# Patient Record
Sex: Female | Born: 1935 | Race: White | Hispanic: No | Smoking: Never smoker
Health system: Southern US, Community
[De-identification: ages and names within clinical notes are randomized; demographics above are authoritative.]

## PROBLEM LIST (undated history)

## (undated) DIAGNOSIS — E039 Hypothyroidism, unspecified: Secondary | ICD-10-CM

## (undated) DIAGNOSIS — T4145XA Adverse effect of unspecified anesthetic, initial encounter: Secondary | ICD-10-CM

## (undated) DIAGNOSIS — M1990 Unspecified osteoarthritis, unspecified site: Secondary | ICD-10-CM

## (undated) DIAGNOSIS — I10 Essential (primary) hypertension: Secondary | ICD-10-CM

## (undated) DIAGNOSIS — S7292XA Unspecified fracture of left femur, initial encounter for closed fracture: Secondary | ICD-10-CM

## (undated) DIAGNOSIS — E785 Hyperlipidemia, unspecified: Secondary | ICD-10-CM

## (undated) DIAGNOSIS — S5292XA Unspecified fracture of left forearm, initial encounter for closed fracture: Secondary | ICD-10-CM

## (undated) HISTORY — DX: Unspecified fracture of left femur, initial encounter for closed fracture: S72.92XA

## (undated) HISTORY — PX: COLONOSCOPY: SHX174

## (undated) HISTORY — DX: Hypothyroidism, unspecified: E03.9

## (undated) HISTORY — PX: ABDOMINAL HYSTERECTOMY: SHX81

## (undated) HISTORY — DX: Hyperlipidemia, unspecified: E78.5

## (undated) HISTORY — DX: Unspecified fracture of left forearm, initial encounter for closed fracture: S52.92XA

## (undated) HISTORY — DX: Essential (primary) hypertension: I10

---

## 1999-07-02 LAB — COMPREHENSIVE METABOLIC PANEL: Albumin: 4 (ref 3.5–5.0)

## 1999-07-02 LAB — LIPID PANEL: Cholesterol: 199 (ref 0–200)

## 1999-07-02 LAB — TSH: TSH: 4.49 (ref 0.41–5.90)

## 2001-01-16 ENCOUNTER — Encounter: Admission: RE | Admit: 2001-01-16 | Discharge: 2001-04-16 | Payer: Self-pay

## 2007-11-10 ENCOUNTER — Ambulatory Visit: Payer: Self-pay | Admitting: Vascular Surgery

## 2007-11-10 ENCOUNTER — Encounter (INDEPENDENT_AMBULATORY_CARE_PROVIDER_SITE_OTHER): Payer: Self-pay | Admitting: Emergency Medicine

## 2007-11-10 ENCOUNTER — Emergency Department (HOSPITAL_COMMUNITY): Admission: EM | Admit: 2007-11-10 | Discharge: 2007-11-10 | Payer: Self-pay | Admitting: Emergency Medicine

## 2007-11-25 ENCOUNTER — Ambulatory Visit: Payer: Self-pay | Admitting: Internal Medicine

## 2007-11-25 DIAGNOSIS — J984 Other disorders of lung: Secondary | ICD-10-CM

## 2008-05-21 ENCOUNTER — Encounter: Admission: RE | Admit: 2008-05-21 | Discharge: 2008-05-21 | Payer: Self-pay | Admitting: *Deleted

## 2008-05-27 ENCOUNTER — Ambulatory Visit: Payer: Self-pay | Admitting: Internal Medicine

## 2010-06-18 DIAGNOSIS — S7292XA Unspecified fracture of left femur, initial encounter for closed fracture: Secondary | ICD-10-CM

## 2010-06-18 DIAGNOSIS — S5292XA Unspecified fracture of left forearm, initial encounter for closed fracture: Secondary | ICD-10-CM

## 2010-06-18 HISTORY — DX: Unspecified fracture of left femur, initial encounter for closed fracture: S72.92XA

## 2010-06-18 HISTORY — DX: Unspecified fracture of left forearm, initial encounter for closed fracture: S52.92XA

## 2011-03-14 LAB — POCT I-STAT, CHEM 8
Calcium, Ion: 1.15
Chloride: 106
Creatinine, Ser: 1.2
Glucose, Bld: 103 — ABNORMAL HIGH
HCT: 40

## 2011-03-14 LAB — CBC
HCT: 40
MCV: 91
Platelets: 236
WBC: 4.8

## 2011-04-07 ENCOUNTER — Emergency Department (HOSPITAL_COMMUNITY): Payer: Medicare Other

## 2011-04-07 ENCOUNTER — Inpatient Hospital Stay (HOSPITAL_COMMUNITY)
Admission: EM | Admit: 2011-04-07 | Discharge: 2011-04-11 | DRG: 481 | Disposition: A | Discharge status: Skilled Nursing Facility | Payer: Medicare Other | Attending: Orthopedic Surgery | Admitting: Orthopedic Surgery

## 2011-04-07 DIAGNOSIS — W010XXA Fall on same level from slipping, tripping and stumbling without subsequent striking against object, initial encounter: Secondary | ICD-10-CM | POA: Diagnosis present

## 2011-04-07 DIAGNOSIS — E039 Hypothyroidism, unspecified: Secondary | ICD-10-CM | POA: Diagnosis present

## 2011-04-07 DIAGNOSIS — S72143A Displaced intertrochanteric fracture of unspecified femur, initial encounter for closed fracture: Principal | ICD-10-CM | POA: Diagnosis present

## 2011-04-07 DIAGNOSIS — S52509A Unspecified fracture of the lower end of unspecified radius, initial encounter for closed fracture: Secondary | ICD-10-CM | POA: Diagnosis present

## 2011-04-07 DIAGNOSIS — I10 Essential (primary) hypertension: Secondary | ICD-10-CM | POA: Diagnosis present

## 2011-04-07 LAB — PROTIME-INR: Prothrombin Time: 12.7 seconds (ref 11.6–15.2)

## 2011-04-07 LAB — CBC
MCH: 31.6 pg (ref 26.0–34.0)
MCHC: 34.2 g/dL (ref 30.0–36.0)
MCV: 92.2 fL (ref 78.0–100.0)
Platelets: 206 10*3/uL (ref 150–400)
RDW: 13.2 % (ref 11.5–15.5)

## 2011-04-07 LAB — DIFFERENTIAL
Basophils Relative: 0 % (ref 0–1)
Eosinophils Absolute: 0 10*3/uL (ref 0.0–0.7)
Eosinophils Relative: 0 % (ref 0–5)
Monocytes Absolute: 0.5 10*3/uL (ref 0.1–1.0)
Monocytes Relative: 4 % (ref 3–12)
Neutrophils Relative %: 88 % — ABNORMAL HIGH (ref 43–77)

## 2011-04-07 LAB — POCT I-STAT, CHEM 8
BUN: 21 mg/dL (ref 6–23)
Calcium, Ion: 1.13 mmol/L (ref 1.12–1.32)
Creatinine, Ser: 1.1 mg/dL (ref 0.50–1.10)
Glucose, Bld: 116 mg/dL — ABNORMAL HIGH (ref 70–99)
TCO2: 24 mmol/L (ref 0–100)

## 2011-04-07 LAB — BASIC METABOLIC PANEL
BUN: 21 mg/dL (ref 6–23)
Creatinine, Ser: 0.89 mg/dL (ref 0.50–1.10)
GFR calc Af Amer: 69 mL/min — ABNORMAL LOW (ref >90)
GFR calc non Af Amer: 60 mL/min — ABNORMAL LOW (ref >90)
Glucose, Bld: 131 mg/dL — ABNORMAL HIGH (ref 70–99)

## 2011-04-08 ENCOUNTER — Inpatient Hospital Stay (HOSPITAL_COMMUNITY): Payer: Medicare Other

## 2011-04-08 HISTORY — PX: ORIF FEMUR FRACTURE: SHX2119

## 2011-04-09 LAB — BASIC METABOLIC PANEL
BUN: 12 mg/dL (ref 6–23)
Chloride: 100 mEq/L (ref 96–112)
GFR calc Af Amer: 90 mL/min — ABNORMAL LOW (ref >90)
Glucose, Bld: 141 mg/dL — ABNORMAL HIGH (ref 70–99)
Potassium: 3.6 mEq/L (ref 3.5–5.1)
Sodium: 134 mEq/L — ABNORMAL LOW (ref 135–145)

## 2011-04-09 LAB — PROTIME-INR: INR: 1.16 (ref 0.00–1.49)

## 2011-04-09 LAB — CBC
HCT: 29.2 % — ABNORMAL LOW (ref 36.0–46.0)
Hemoglobin: 10 g/dL — ABNORMAL LOW (ref 12.0–15.0)
MCH: 31.5 pg (ref 26.0–34.0)
MCHC: 34.2 g/dL (ref 30.0–36.0)
MCV: 92.1 fL (ref 78.0–100.0)
RDW: 13.6 % (ref 11.5–15.5)

## 2011-04-09 NOTE — H&P (Signed)
NAMESAMAN, GIDDENS NO.:  [id number]  MEDICAL RECORD NO.:  [id number]  LOCATION:  2585                         FACILITY:  [facility]  PHYSICIAN:  Tania Ade, MD    DATE OF BIRTH:  1928-08-13  DATE OF ADMISSION:  04/07/2011 DATE OF DISCHARGE:                             HISTORY & PHYSICAL   CHIEF COMPLAINT:  Left wrist and left hip pain.  HISTORY OF PRESENT ILLNESS:  Ms. Zupko is an very pleasant 77 year old, active and healthy female who was out walking her dogs earlier this afternoon and got tangled up.  She had a fall onto her left side and had complaint of left wrist and hip pain.  She was taken to the [facility] Emergency Department, where x-rays revealed a comminuted left distal radius fracture and left hip fracture.  I was consulted for evaluation and management.  Her pain is moderate to severe in the left wrist and moderate in the left hip worse with any movement better with rest.  She denies any loss of consciousness with the injury.  Denies any other injuries.  She did not hit her head in the fall.  PAST MEDICAL HISTORY:  Significant for hypothyroid and mild hypertension.  MEDICATIONS:  Synthroid and multivitamins.  ALLERGIES:  To PENICILLIN.  SOCIAL HISTORY:  Nonsmoker.  Occasional drinker.  She lives with her husband, who recently had a stroke in his recovery.  She previously walked without any assistive device.  REVIEW OF SYSTEMS:  As above otherwise negative.  PHYSICAL EXAMINATION:  VITAL SIGNS:  Temperature 100.0, blood pressure 172/86, pulse 76, respiratory rate 20 and O2 sats 96% on room air. GENERAL:  She is awake, alert and oriented x3.  Extraocular motions intact.  No respiratory distress.  EXTREMITIES:  Left upper extremity demonstrates a well-fitting volar splint.  I rewrapped it loosely because she was complaining it was a bit too tight.  Skin is intact. She can wiggle her fingers and thumb.  She has a normal sensation  to light touch median, ulnar and radial nerve distributions.  Capillary refill less than 2 seconds.  Right upper extremity is atraumatic.  The left lower extremity is short and externally rotated.  She can wiggle her toes up and down on both sides and has a normal sensation to light touch on the dorsal and plantar aspect of the foot with 2+ pedal pulses.  DIAGNOSTIC STUDIES:  X-rays including 4-views of the left wrist demonstrate a comminuted distal radius and ulnar styloid fracture with radial deviation and shortening.  Fracture appears intra-articular.  The 3-views of the left hip demonstrate a three-part intertrochanteric proximal femur fracture with displacement of the lesser trochanter.  LABORATORY VALUES:  WBC 13, hemoglobin 12.5, hematocrit 36.5, platelets 206. INR 0.93. Potassium 3.5, glucose 116.  IMPRESSION:  An 77 year old active female with left comminuted distal radius fracture and left intertrochanteric femur fracture.  PLAN:  She will be admitted to the Orthopedic Service.  She is elderly, but very active and healthy and has minimal medical issues that are currently active.  I spoke to her at length about her diagnosis and recommended surgical fixation of the hip to promote early ambulation and prevent complications  of bedrest.  She elects to go forward with surgery.  Plan will be for an intramedullary and cephalomedullary nail.  We will plan on getting her surgery done in the morning.  At the time of surgery, I have also performed closed reduction of the distal radius fracture and I assume to get acceptable alignment.  I will put her in a good sugar tong splint.  I told her that I may open reduce and fixate with a plate if necessary, but I think it is very unlikely.  She will have SCDs and TED hose for DVT prophylaxis preoperatively and then will have Coumadin postoperatively.  All of her questions were answered to her satisfaction.     Tania Ade,  MD     JC/MEDQ  D:  04/07/2011  T:  04/08/2011  Job:  [id number]  Electronically Signed by Tania Ade  on 04/09/2011 08:14:53 AM

## 2011-04-09 NOTE — Op Note (Signed)
NAMEJANNAT, Amber [name]:  [id number]  MEDICAL RECORD NO.:  [id number]  LOCATION:  0300                         FACILITY:  [facility]  PHYSICIAN:  Tania Ade, MD    DATE OF BIRTH:  [birth year]  DATE OF PROCEDURE:  04/08/2011 DATE OF DISCHARGE:                              OPERATIVE REPORT   PREOPERATIVE DIAGNOSES: 1. Left hip three-part intertrochanteric femur fracture. 2. Left distal radius comminuted intra-articular fracture.  POSTOPERATIVE DIAGNOSES: 1. Left hip three-part intertrochanteric femur fracture. 2. Left distal radius comminuted intra-articular fracture.  PROCEDURE PERFORMED: 1. Left hip trochanteric nail fixation. 2. Left distal radius closed reduction and splinting.  ATTENDING SURGEON:  Tania Ade, MD  ASSISTANT:  None.  ANESTHESIA:  GETA.  COMPLICATIONS:  None.  DRAINS:  None.  SPECIMENS:  None.  ESTIMATED BLOOD LOSS:  50 mL.  INDICATION FOR SURGERY:  The patient is an 77 year old female who is quite healthy.  She had a fall yesterday afternoon and suffered the above injuries.  She was indicated for operative treatment of the hip fracture to promote early ambulation and prevent complications of bedrest.  She understood risks, benefits and alternatives to surgery, and wished to go forward.  OPERATIVE FINDINGS:  A DePuy Affixus nail, which was 360 x 11 mm with a 95-mm lag screw proximally was placed with 1 distal interlocking screw with anatomic alignment.  Close reduction and splinting was carried out for the comminuted left distal radius fracture.  Fracture was brought out to length and acceptable alignment.  PROCEDURE:  The patient was identified in the preoperative holding area where I personally marked the operative site after verifying site, side, and procedure with the patient.  She had general anesthesia induced without complication.  She received preoperative antibiotics prior to the procedure.  A Foley  catheter was placed by the OR nurse.  She was placed on the fracture table with the left lower extremity in traction and the right lower extremity in an abducted-flexed position.  The left arm was draped across her chest and her splint.  Right arm was on the arm board.  Fluoroscopic imaging demonstrated anatomic reduction of the fracture after some traction and internal rotation was applied.  The left hip was then prepped and draped in standard sterile fashion and an approximately 3 cm incision was made proximal to the greater trochanter tip.  Dissection was carried down to the tip.  The guidewire was placed under fluoroscopic guidance and AP and lateral planes felt to be in good position.  The entry-reamer was then used and the nail was placed.  The lateral guide was then used to identify the position for a small approximately 15 mm incision in the lateral thigh and the guide was placed against the lateral femur.  The guidewire was advanced to a center-center point in the femoral head and is verified with fluoroscopic imaging.  The appropriate size of the screw was measured and the over-reamer was used.  The lag screw was then advanced and its position was verified with x-ray.  The proximal jig was then removed and attention was turned distally where using perfect circles technique, 1 static interlocking screw was placed from lateral to medial  through a percutaneous incision.  Final fluoroscopic imaging in AP and lateral planes proximally and distally demonstrated anatomic reduction of fracture with appropriate length and alignment. With all wounds were copiously irrigated and subsequently closed in layers with a number 1 Vicryl in deep fascial layer, 2-0 Vicryl in deep dermal layer, and staples for skin closure.  Sterile dressings were then applied including 4x4s and Mepilex dressings.  The patient was then taken out of traction and attention was turned to the left  upper extremity.  The splint was taken off of the left upper extremity.  She has a small abrasion on the ulnar aspect of the wrist, but clearly did not communicate deeply.  Using the traction and ulnar deviation, I was able to bring the wrist out into acceptable length and alignment.  I then placed her in a well-padded, well-molded sugar-tong splint.  X-rays in the splint demonstrated acceptable reduction.  Once the splint hardened, she was allowed to awaken from general anesthesia, transferred to stretcher and taken to the recovery room in stable condition.  POSTOPERATIVE PLAN:  She will be weightbearing as tolerated on the left lower extremity.  She can be weightbearing as tolerated through the left elbow on a platform-walker.  She will be observed in the hospital postoperatively when she is medically stable.  She will be likely transition to a skilled nursing facility.  She will have Coumadin for DVT prophylaxis.     Tania Ade, MD     JC/MEDQ  D:  04/08/2011  T:  04/08/2011  Job:  [id number]  Electronically Signed by Tania Ade  on 04/09/2011 08:14:57 AM

## 2011-04-10 LAB — BASIC METABOLIC PANEL
BUN: 11 mg/dL (ref 6–23)
BUN: 12 mg/dL (ref 6–23)
CO2: 25 mEq/L (ref 19–32)
Calcium: 8.7 mg/dL (ref 8.4–10.5)
Chloride: 102 mEq/L (ref 96–112)
Chloride: 102 mEq/L (ref 96–112)
Creatinine, Ser: 0.72 mg/dL (ref 0.50–1.10)
Creatinine, Ser: 0.83 mg/dL (ref 0.50–1.10)
GFR calc non Af Amer: 79 mL/min — ABNORMAL LOW (ref >90)
Glucose, Bld: 381 mg/dL — ABNORMAL HIGH (ref 70–99)
Potassium: 5.2 mEq/L — ABNORMAL HIGH (ref 3.5–5.1)

## 2011-04-10 LAB — CBC
MCH: 30.7 pg (ref 26.0–34.0)
MCHC: 33.2 g/dL (ref 30.0–36.0)
MCV: 92.6 fL (ref 78.0–100.0)
Platelets: 130 10*3/uL — ABNORMAL LOW (ref 150–400)
RBC: 2.96 MIL/uL — ABNORMAL LOW (ref 3.87–5.11)

## 2011-04-10 NOTE — Consult Note (Signed)
NAMEMARYJANE, Amber [name]:  [id number]  MEDICAL RECORD NO.:  [id number]  LOCATION:  0932                         FACILITY:  [facility]  PHYSICIAN:  Estill Cotta, MD       DATE OF BIRTH:  [birth year]  DATE OF CONSULTATION:  04/10/2011 DATE OF DISCHARGE:                                CONSULTATION   PRIMARY CARE PHYSICIAN:  Tania Ade, MD  REASON FOR CONSULTATION:  Hyperglycemia with a blood sugar of 380 for one day.  HISTORY OF PRESENT ILLNESS:  Amber Bryant is an 77 year old pleasant female with history of hypertension and hypothyroidism who is currently admitted under Orthopedic Service.  The patient had a comminuted left distal radius fracture and left hip fracture status post mechanical fall.  The patient had left hip surgery and left distal radius closed reduction and splinting on April 08, 2011.  The patient has been doing well and is currently undergoing physical therapy.  This morning, the patient was noted to have blood sugar of 381.  Upon my encounter with the patient, she denies any history of diabetes mellitus, denies any increased urinary frequency or polyuria or polydipsia.  The patient does not have any nausea, vomiting, any diarrhea constipation, hematochezia, or melena.  PAST MEDICAL HISTORY: 1. History of mild hypertension. 2. Hypothyroidism.  MEDICATIONS PRIOR TO ADMISSION:  Synthroid and multivitamins.  ALLERGIES:  PENICILLIN.  SOCIAL HISTORY:  The patient denies any smoking, drinks alcohol occasionally.  She currently lives with her husband.  REVIEW OF SYSTEMS:  Pertinent positives are dictated above, otherwise negative.  PHYSICAL EXAMINATION:  VITAL SIGNS:  Temperature 98.1, pulse 80, respirations 16, blood pressure 107/67, O2 sats 95% on room air. GENERAL:  The patient is alert and awake, not in any acute distress. HEENT:  Anicteric sclerae, pink conjunctivae, pupils reactive to light and accommodation, EOMI. NECK:  Supple.   No lymphadenopathy. CVS:  S1, S2 clear. CHEST:  Clear to auscultation bilaterally. ABDOMEN:  Soft, nontender, nondistended.  Normal bowel sounds. EXTREMITIES:  No cyanosis, clubbing, or edema noted in upper or lower extremities bilaterally.  LABORATORY/DIAGNOSTIC DATA:  Today INR 1.25.  CBC showed white count of 7.1, hemoglobin 9.1, hematocrit 27.4, platelet 130.  BMET:  Sodium 131, potassium 5.2, BUN 11, creatinine 0.72, calcium 7.7.  IMPRESSION AND PLAN:  Amber Bryant is an 77 year old female with no prior history of diabetes mellitus and past medical history of hypothyroidism and mild hypertension and is currently admitted under Orthopedic Service after left hip surgery and left distal radius surgery. 1. Hyperglycemia.  The patient denies any history of diabetes mellitus     or any diet-controlled diabetes.  I am questioning if her     hyperglycemia is secondary to the D5 half-normal saline that the     patient was receiving which was discontinued today just prior to my encounter.  I will obtain     HB A1c to confirm if the patient has diabetes or not.  For today,     we will check CBGs every 4 hours.  I will hold off on starting any     insulin at this time.  The patient is not on any steroids.  We  will     change the diet to carb modified for now.  If blood sugars are     consistently above 140 x3 and HB A1c is above 6.5, will place     her on low-dose oral hypoglycemic and sliding scale insulin     inpatient.  I will also obtain a BMET to confirm the hyperglycemia     and mild hyperkalemia on the labs. 2. Hypertension, currently stable.  Continue patient's     antihypertensives. 3. History of hypothyroidism, continue Synthroid. 4. Deep venous thrombosis prophylaxis.  The patient is currently on     warfarin for deep venous thrombosis prophylaxis.  Thank you the consult.  I will continue to follow with you.     Estill Cotta, MD     RR/MEDQ  D:  04/10/2011  T:   04/10/2011  Job:  [id number]  Electronically Signed by Symon Norwood  on 04/10/2011 01:47:53 PM

## 2011-04-11 LAB — PROTIME-INR
INR: 1.18 (ref 0.00–1.49)
Prothrombin Time: 15.2 seconds (ref 11.6–15.2)

## 2011-04-11 LAB — CBC
Platelets: 159 10*3/uL (ref 150–400)
RBC: 3.31 MIL/uL — ABNORMAL LOW (ref 3.87–5.11)
WBC: 8.5 10*3/uL (ref 4.0–10.5)

## 2011-04-11 LAB — COMPREHENSIVE METABOLIC PANEL
ALT: 86 U/L — ABNORMAL HIGH (ref 0–35)
AST: 86 U/L — ABNORMAL HIGH (ref 0–37)
Albumin: 2.4 g/dL — ABNORMAL LOW (ref 3.5–5.2)
Alkaline Phosphatase: 173 U/L — ABNORMAL HIGH (ref 39–117)
Potassium: 3.7 mEq/L (ref 3.5–5.1)
Sodium: 135 mEq/L (ref 135–145)
Total Protein: 6 g/dL (ref 6.0–8.3)

## 2011-04-11 LAB — HEMOGLOBIN A1C
Hgb A1c MFr Bld: 5.7 % — ABNORMAL HIGH (ref <5.7)
Mean Plasma Glucose: 117 mg/dL — ABNORMAL HIGH (ref <117)

## 2011-04-21 NOTE — Discharge Summary (Signed)
  NAMEQUINNLYN, [name]:  [id number]  MEDICAL RECORD NO.:  [id number]  LOCATION:  7846                         FACILITY:  [facility]  PHYSICIAN:  Tania Ade, MD    DATE OF BIRTH:  04-13-1929  DATE OF ADMISSION:  04/07/2011 DATE OF DISCHARGE:  04/11/2011                              DISCHARGE SUMMARY   FINAL DIAGNOSES: 1. Hypothyroidism. 2. Hypertension. 3. Left intertrochanteric hip fracture. 4. Left comminuted distal radius fracture.  PRINCIPAL PROCEDURE: 1. Left hip trochanteric nail fixation. 2. Left distal radius fracture closed reduction and splinting on     April 08, 2011.  HOSPITAL COURSE:  Ms. Shankles is an 77 year old female who had a mechanical fall on April 07, 2011.  She was admitted through the [facility] Emergency Department to my service for management of her left hip fracture and wrist fracture.  She was taken to the operating room on April 08, 2011 where she had successful trochanteric nail fixation of the left hip and closed reduction and splinting of left distal radius fracture.  She recovered well on the floor postoperatively.  Her pain was initially controlled with PCA and subsequently transitioned to oral medications.  She did well with physical therapy and was felt to be a good candidate for skilled nursing facility, given her social situation at home and her slow progression.  Her Foley catheter was discontinued on postoperative day #2, and she was started on Coumadin immediately postoperatively for DVT prophylaxis as well as SCDs and TED hose throughout her hospital stay.  She did have a slight rise in her potassium and blood glucose on postoperative day #2, and Internal Medicine Service was consulted.  Her lab values quickly normalized, and no further intervention was needed.  No blood transfusion was needed. On postoperative day #3, she was felt to be stable for transfer to skilled nursing facility.  Her vital signs  were stable.  Her dressings were changed and her incisions were clean, dry, and intact.  DISCHARGE INSTRUCTIONS:  She will remain weightbearing as tolerated on the left lower extremity and weightbearing as tolerated through the left elbow on a platform walker.  She will have Percocet for pain control and will continue on Coumadin per protocol with a goal INR of 1.5-2.5 for 30 days postoperatively.  She will follow up with me in my office 10-14 days postoperatively for wound check and transition to a short-arm cast on the left arm.     Tania Ade, MD    JC/MEDQ  D:  04/11/2011  T:  04/11/2011  Job:  [id number]  Electronically Signed by Tania Ade  on 04/21/2011 01:48:13 PM

## 2011-06-21 DIAGNOSIS — M25539 Pain in unspecified wrist: Secondary | ICD-10-CM | POA: Diagnosis not present

## 2011-06-21 DIAGNOSIS — S52599A Other fractures of lower end of unspecified radius, initial encounter for closed fracture: Secondary | ICD-10-CM | POA: Diagnosis not present

## 2011-06-26 DIAGNOSIS — M25539 Pain in unspecified wrist: Secondary | ICD-10-CM | POA: Diagnosis not present

## 2011-06-28 DIAGNOSIS — M25539 Pain in unspecified wrist: Secondary | ICD-10-CM | POA: Diagnosis not present

## 2011-06-29 DIAGNOSIS — M6530 Trigger finger, unspecified finger: Secondary | ICD-10-CM | POA: Diagnosis not present

## 2011-06-29 DIAGNOSIS — S7223XA Displaced subtrochanteric fracture of unspecified femur, initial encounter for closed fracture: Secondary | ICD-10-CM | POA: Diagnosis not present

## 2011-06-29 DIAGNOSIS — M25539 Pain in unspecified wrist: Secondary | ICD-10-CM | POA: Diagnosis not present

## 2011-06-29 DIAGNOSIS — S52599A Other fractures of lower end of unspecified radius, initial encounter for closed fracture: Secondary | ICD-10-CM | POA: Diagnosis not present

## 2011-07-05 DIAGNOSIS — M25539 Pain in unspecified wrist: Secondary | ICD-10-CM | POA: Diagnosis not present

## 2011-07-12 DIAGNOSIS — M25539 Pain in unspecified wrist: Secondary | ICD-10-CM | POA: Diagnosis not present

## 2011-07-12 DIAGNOSIS — S7223XA Displaced subtrochanteric fracture of unspecified femur, initial encounter for closed fracture: Secondary | ICD-10-CM | POA: Diagnosis not present

## 2011-07-16 DIAGNOSIS — M25539 Pain in unspecified wrist: Secondary | ICD-10-CM | POA: Diagnosis not present

## 2011-07-19 DIAGNOSIS — S7223XA Displaced subtrochanteric fracture of unspecified femur, initial encounter for closed fracture: Secondary | ICD-10-CM | POA: Diagnosis not present

## 2011-07-19 DIAGNOSIS — M25539 Pain in unspecified wrist: Secondary | ICD-10-CM | POA: Diagnosis not present

## 2011-07-26 DIAGNOSIS — M25539 Pain in unspecified wrist: Secondary | ICD-10-CM | POA: Diagnosis not present

## 2011-07-26 DIAGNOSIS — S7223XA Displaced subtrochanteric fracture of unspecified femur, initial encounter for closed fracture: Secondary | ICD-10-CM | POA: Diagnosis not present

## 2011-07-30 DIAGNOSIS — M25539 Pain in unspecified wrist: Secondary | ICD-10-CM | POA: Diagnosis not present

## 2011-08-01 DIAGNOSIS — S7223XA Displaced subtrochanteric fracture of unspecified femur, initial encounter for closed fracture: Secondary | ICD-10-CM | POA: Diagnosis not present

## 2011-08-01 DIAGNOSIS — M25539 Pain in unspecified wrist: Secondary | ICD-10-CM | POA: Diagnosis not present

## 2011-08-06 DIAGNOSIS — S7223XA Displaced subtrochanteric fracture of unspecified femur, initial encounter for closed fracture: Secondary | ICD-10-CM | POA: Diagnosis not present

## 2011-08-06 DIAGNOSIS — M25539 Pain in unspecified wrist: Secondary | ICD-10-CM | POA: Diagnosis not present

## 2011-08-09 DIAGNOSIS — R42 Dizziness and giddiness: Secondary | ICD-10-CM | POA: Diagnosis not present

## 2011-08-22 DIAGNOSIS — M25539 Pain in unspecified wrist: Secondary | ICD-10-CM | POA: Diagnosis not present

## 2011-08-28 DIAGNOSIS — S7223XA Displaced subtrochanteric fracture of unspecified femur, initial encounter for closed fracture: Secondary | ICD-10-CM | POA: Diagnosis not present

## 2011-08-28 DIAGNOSIS — M25539 Pain in unspecified wrist: Secondary | ICD-10-CM | POA: Diagnosis not present

## 2011-09-06 DIAGNOSIS — S7223XA Displaced subtrochanteric fracture of unspecified femur, initial encounter for closed fracture: Secondary | ICD-10-CM | POA: Diagnosis not present

## 2011-09-06 DIAGNOSIS — M25539 Pain in unspecified wrist: Secondary | ICD-10-CM | POA: Diagnosis not present

## 2011-09-11 DIAGNOSIS — M25539 Pain in unspecified wrist: Secondary | ICD-10-CM | POA: Diagnosis not present

## 2011-09-19 DIAGNOSIS — M949 Disorder of cartilage, unspecified: Secondary | ICD-10-CM | POA: Diagnosis not present

## 2011-09-19 DIAGNOSIS — S9030XA Contusion of unspecified foot, initial encounter: Secondary | ICD-10-CM | POA: Diagnosis not present

## 2011-09-19 DIAGNOSIS — M25579 Pain in unspecified ankle and joints of unspecified foot: Secondary | ICD-10-CM | POA: Diagnosis not present

## 2011-10-02 DIAGNOSIS — E785 Hyperlipidemia, unspecified: Secondary | ICD-10-CM | POA: Diagnosis not present

## 2011-10-02 DIAGNOSIS — M810 Age-related osteoporosis without current pathological fracture: Secondary | ICD-10-CM | POA: Diagnosis not present

## 2011-10-02 DIAGNOSIS — I10 Essential (primary) hypertension: Secondary | ICD-10-CM | POA: Diagnosis not present

## 2011-10-02 DIAGNOSIS — E039 Hypothyroidism, unspecified: Secondary | ICD-10-CM | POA: Diagnosis not present

## 2011-10-03 DIAGNOSIS — E559 Vitamin D deficiency, unspecified: Secondary | ICD-10-CM | POA: Diagnosis not present

## 2011-10-03 DIAGNOSIS — E039 Hypothyroidism, unspecified: Secondary | ICD-10-CM | POA: Diagnosis not present

## 2011-10-03 DIAGNOSIS — I10 Essential (primary) hypertension: Secondary | ICD-10-CM | POA: Diagnosis not present

## 2011-11-14 DIAGNOSIS — M25579 Pain in unspecified ankle and joints of unspecified foot: Secondary | ICD-10-CM | POA: Diagnosis not present

## 2012-02-26 DIAGNOSIS — H2510 Age-related nuclear cataract, unspecified eye: Secondary | ICD-10-CM | POA: Diagnosis not present

## 2012-03-19 DIAGNOSIS — Z23 Encounter for immunization: Secondary | ICD-10-CM | POA: Diagnosis not present

## 2012-04-02 DIAGNOSIS — Z23 Encounter for immunization: Secondary | ICD-10-CM | POA: Diagnosis not present

## 2012-04-02 DIAGNOSIS — Z0000 Encounter for general adult medical examination without abnormal findings: Secondary | ICD-10-CM | POA: Diagnosis not present

## 2012-04-02 DIAGNOSIS — I10 Essential (primary) hypertension: Secondary | ICD-10-CM | POA: Diagnosis not present

## 2012-04-02 DIAGNOSIS — M810 Age-related osteoporosis without current pathological fracture: Secondary | ICD-10-CM | POA: Diagnosis not present

## 2012-04-02 DIAGNOSIS — E039 Hypothyroidism, unspecified: Secondary | ICD-10-CM | POA: Diagnosis not present

## 2012-04-02 DIAGNOSIS — E785 Hyperlipidemia, unspecified: Secondary | ICD-10-CM | POA: Diagnosis not present

## 2012-04-09 DIAGNOSIS — E039 Hypothyroidism, unspecified: Secondary | ICD-10-CM | POA: Diagnosis not present

## 2012-04-09 DIAGNOSIS — E785 Hyperlipidemia, unspecified: Secondary | ICD-10-CM | POA: Diagnosis not present

## 2012-04-09 DIAGNOSIS — M810 Age-related osteoporosis without current pathological fracture: Secondary | ICD-10-CM | POA: Diagnosis not present

## 2012-04-09 DIAGNOSIS — I10 Essential (primary) hypertension: Secondary | ICD-10-CM | POA: Diagnosis not present

## 2012-06-18 HISTORY — PX: CATARACT EXTRACTION W/ INTRAOCULAR LENS  IMPLANT, BILATERAL: SHX1307

## 2012-07-23 DIAGNOSIS — M654 Radial styloid tenosynovitis [de Quervain]: Secondary | ICD-10-CM | POA: Diagnosis not present

## 2012-07-23 DIAGNOSIS — M6530 Trigger finger, unspecified finger: Secondary | ICD-10-CM | POA: Diagnosis not present

## 2012-08-04 DIAGNOSIS — M6530 Trigger finger, unspecified finger: Secondary | ICD-10-CM | POA: Diagnosis not present

## 2012-08-04 DIAGNOSIS — M654 Radial styloid tenosynovitis [de Quervain]: Secondary | ICD-10-CM | POA: Diagnosis not present

## 2012-08-12 DIAGNOSIS — M6530 Trigger finger, unspecified finger: Secondary | ICD-10-CM | POA: Diagnosis not present

## 2012-08-12 DIAGNOSIS — M654 Radial styloid tenosynovitis [de Quervain]: Secondary | ICD-10-CM | POA: Diagnosis not present

## 2012-08-29 DIAGNOSIS — M654 Radial styloid tenosynovitis [de Quervain]: Secondary | ICD-10-CM | POA: Diagnosis not present

## 2012-08-29 DIAGNOSIS — M6530 Trigger finger, unspecified finger: Secondary | ICD-10-CM | POA: Diagnosis not present

## 2012-10-15 DIAGNOSIS — E559 Vitamin D deficiency, unspecified: Secondary | ICD-10-CM | POA: Diagnosis not present

## 2012-10-15 DIAGNOSIS — I10 Essential (primary) hypertension: Secondary | ICD-10-CM | POA: Diagnosis not present

## 2012-10-15 DIAGNOSIS — E785 Hyperlipidemia, unspecified: Secondary | ICD-10-CM | POA: Diagnosis not present

## 2012-10-15 DIAGNOSIS — E039 Hypothyroidism, unspecified: Secondary | ICD-10-CM | POA: Diagnosis not present

## 2013-03-10 DIAGNOSIS — H2510 Age-related nuclear cataract, unspecified eye: Secondary | ICD-10-CM | POA: Diagnosis not present

## 2013-03-16 DIAGNOSIS — Z23 Encounter for immunization: Secondary | ICD-10-CM | POA: Diagnosis not present

## 2013-04-15 DIAGNOSIS — M810 Age-related osteoporosis without current pathological fracture: Secondary | ICD-10-CM | POA: Diagnosis not present

## 2013-04-15 DIAGNOSIS — I10 Essential (primary) hypertension: Secondary | ICD-10-CM | POA: Diagnosis not present

## 2013-04-15 DIAGNOSIS — E039 Hypothyroidism, unspecified: Secondary | ICD-10-CM | POA: Diagnosis not present

## 2013-04-15 DIAGNOSIS — Z1331 Encounter for screening for depression: Secondary | ICD-10-CM | POA: Diagnosis not present

## 2013-04-15 DIAGNOSIS — Z0000 Encounter for general adult medical examination without abnormal findings: Secondary | ICD-10-CM | POA: Diagnosis not present

## 2013-04-15 DIAGNOSIS — E663 Overweight: Secondary | ICD-10-CM | POA: Diagnosis not present

## 2013-04-22 DIAGNOSIS — E785 Hyperlipidemia, unspecified: Secondary | ICD-10-CM | POA: Diagnosis not present

## 2013-04-22 DIAGNOSIS — I10 Essential (primary) hypertension: Secondary | ICD-10-CM | POA: Diagnosis not present

## 2013-04-22 DIAGNOSIS — E039 Hypothyroidism, unspecified: Secondary | ICD-10-CM | POA: Diagnosis not present

## 2013-04-22 DIAGNOSIS — M810 Age-related osteoporosis without current pathological fracture: Secondary | ICD-10-CM | POA: Diagnosis not present

## 2013-06-17 DIAGNOSIS — F329 Major depressive disorder, single episode, unspecified: Secondary | ICD-10-CM | POA: Diagnosis not present

## 2013-08-20 DIAGNOSIS — H2510 Age-related nuclear cataract, unspecified eye: Secondary | ICD-10-CM | POA: Diagnosis not present

## 2013-09-10 DIAGNOSIS — D3130 Benign neoplasm of unspecified choroid: Secondary | ICD-10-CM | POA: Diagnosis not present

## 2013-09-10 DIAGNOSIS — H2510 Age-related nuclear cataract, unspecified eye: Secondary | ICD-10-CM | POA: Diagnosis not present

## 2013-09-10 DIAGNOSIS — H43819 Vitreous degeneration, unspecified eye: Secondary | ICD-10-CM | POA: Diagnosis not present

## 2013-09-21 DIAGNOSIS — H2510 Age-related nuclear cataract, unspecified eye: Secondary | ICD-10-CM | POA: Diagnosis not present

## 2013-09-21 DIAGNOSIS — H269 Unspecified cataract: Secondary | ICD-10-CM | POA: Diagnosis not present

## 2013-09-22 DIAGNOSIS — H2510 Age-related nuclear cataract, unspecified eye: Secondary | ICD-10-CM | POA: Diagnosis not present

## 2013-09-28 DIAGNOSIS — H2510 Age-related nuclear cataract, unspecified eye: Secondary | ICD-10-CM | POA: Diagnosis not present

## 2013-10-02 DIAGNOSIS — I8310 Varicose veins of unspecified lower extremity with inflammation: Secondary | ICD-10-CM | POA: Diagnosis not present

## 2013-10-13 DIAGNOSIS — L97929 Non-pressure chronic ulcer of unspecified part of left lower leg with unspecified severity: Secondary | ICD-10-CM | POA: Diagnosis not present

## 2013-10-13 DIAGNOSIS — I83219 Varicose veins of right lower extremity with both ulcer of unspecified site and inflammation: Secondary | ICD-10-CM | POA: Diagnosis not present

## 2013-10-13 DIAGNOSIS — L97919 Non-pressure chronic ulcer of unspecified part of right lower leg with unspecified severity: Secondary | ICD-10-CM | POA: Diagnosis not present

## 2013-10-16 DIAGNOSIS — I10 Essential (primary) hypertension: Secondary | ICD-10-CM | POA: Diagnosis not present

## 2013-10-16 DIAGNOSIS — E039 Hypothyroidism, unspecified: Secondary | ICD-10-CM | POA: Diagnosis not present

## 2013-10-16 DIAGNOSIS — M810 Age-related osteoporosis without current pathological fracture: Secondary | ICD-10-CM | POA: Diagnosis not present

## 2013-10-16 DIAGNOSIS — I83219 Varicose veins of right lower extremity with both ulcer of unspecified site and inflammation: Secondary | ICD-10-CM | POA: Diagnosis not present

## 2013-10-16 DIAGNOSIS — I83229 Varicose veins of left lower extremity with both ulcer of unspecified site and inflammation: Secondary | ICD-10-CM | POA: Diagnosis not present

## 2013-10-16 DIAGNOSIS — L97929 Non-pressure chronic ulcer of unspecified part of left lower leg with unspecified severity: Secondary | ICD-10-CM | POA: Diagnosis not present

## 2013-10-27 DIAGNOSIS — I10 Essential (primary) hypertension: Secondary | ICD-10-CM | POA: Diagnosis not present

## 2013-10-27 DIAGNOSIS — N183 Chronic kidney disease, stage 3 unspecified: Secondary | ICD-10-CM | POA: Diagnosis not present

## 2013-10-27 DIAGNOSIS — E785 Hyperlipidemia, unspecified: Secondary | ICD-10-CM | POA: Diagnosis not present

## 2013-10-27 DIAGNOSIS — E039 Hypothyroidism, unspecified: Secondary | ICD-10-CM | POA: Diagnosis not present

## 2013-10-28 DIAGNOSIS — M79609 Pain in unspecified limb: Secondary | ICD-10-CM | POA: Diagnosis not present

## 2013-10-28 DIAGNOSIS — I8310 Varicose veins of unspecified lower extremity with inflammation: Secondary | ICD-10-CM | POA: Diagnosis not present

## 2013-10-30 DIAGNOSIS — I8310 Varicose veins of unspecified lower extremity with inflammation: Secondary | ICD-10-CM | POA: Diagnosis not present

## 2013-10-30 DIAGNOSIS — M79609 Pain in unspecified limb: Secondary | ICD-10-CM | POA: Diagnosis not present

## 2013-11-02 DIAGNOSIS — I8310 Varicose veins of unspecified lower extremity with inflammation: Secondary | ICD-10-CM | POA: Diagnosis not present

## 2013-11-02 DIAGNOSIS — M79609 Pain in unspecified limb: Secondary | ICD-10-CM | POA: Diagnosis not present

## 2013-11-04 DIAGNOSIS — M79609 Pain in unspecified limb: Secondary | ICD-10-CM | POA: Diagnosis not present

## 2013-11-04 DIAGNOSIS — I8310 Varicose veins of unspecified lower extremity with inflammation: Secondary | ICD-10-CM | POA: Diagnosis not present

## 2013-11-11 DIAGNOSIS — M79609 Pain in unspecified limb: Secondary | ICD-10-CM | POA: Diagnosis not present

## 2013-11-11 DIAGNOSIS — I8310 Varicose veins of unspecified lower extremity with inflammation: Secondary | ICD-10-CM | POA: Diagnosis not present

## 2013-11-18 DIAGNOSIS — I83219 Varicose veins of right lower extremity with both ulcer of unspecified site and inflammation: Secondary | ICD-10-CM | POA: Diagnosis not present

## 2013-11-18 DIAGNOSIS — L97929 Non-pressure chronic ulcer of unspecified part of left lower leg with unspecified severity: Secondary | ICD-10-CM | POA: Diagnosis not present

## 2013-11-23 ENCOUNTER — Encounter (HOSPITAL_BASED_OUTPATIENT_CLINIC_OR_DEPARTMENT_OTHER): Payer: Medicare Other | Attending: Plastic Surgery

## 2013-11-23 DIAGNOSIS — N189 Chronic kidney disease, unspecified: Secondary | ICD-10-CM | POA: Diagnosis not present

## 2013-11-23 DIAGNOSIS — E039 Hypothyroidism, unspecified: Secondary | ICD-10-CM | POA: Diagnosis not present

## 2013-11-23 DIAGNOSIS — Z79899 Other long term (current) drug therapy: Secondary | ICD-10-CM | POA: Insufficient documentation

## 2013-11-23 DIAGNOSIS — M810 Age-related osteoporosis without current pathological fracture: Secondary | ICD-10-CM | POA: Diagnosis not present

## 2013-11-23 DIAGNOSIS — E785 Hyperlipidemia, unspecified: Secondary | ICD-10-CM | POA: Diagnosis not present

## 2013-11-23 DIAGNOSIS — I8390 Asymptomatic varicose veins of unspecified lower extremity: Secondary | ICD-10-CM | POA: Diagnosis not present

## 2013-11-23 DIAGNOSIS — L97309 Non-pressure chronic ulcer of unspecified ankle with unspecified severity: Secondary | ICD-10-CM | POA: Diagnosis not present

## 2013-11-23 DIAGNOSIS — Z7982 Long term (current) use of aspirin: Secondary | ICD-10-CM | POA: Insufficient documentation

## 2013-11-23 DIAGNOSIS — I129 Hypertensive chronic kidney disease with stage 1 through stage 4 chronic kidney disease, or unspecified chronic kidney disease: Secondary | ICD-10-CM | POA: Insufficient documentation

## 2013-11-24 NOTE — Progress Notes (Signed)
[facility]  NAME:  Amber Bryant, Amber [name]:  [id number]  MEDICAL RECORD NO.:  [id number]      DATE OF BIRTH:  January 27, 1929  PHYSICIAN:  Theodoro Kos, DO       VISIT DATE:  11/23/2013                                  OFFICE VISIT   HISTORY OF PRESENT ILLNESS:  The patient is an 80 year old female, who is here for further evaluation on her left ankle ulcer from trauma.  She was at the beauty salon when she accidentally hit her ankle on a metal bar and she was seen by the vascular folks and wrap was placed, and today she is actually healed.  PAST MEDICAL HISTORY:  Positive for hypertension, hyperlipidemia, hypothyroidism, chronic kidney disease, osteoporosis, depression, and ankle wound.  ALLERGIES:  Amoxicillin.  MEDICATIONS:  Aspirin, Lasix, vitamin D, triamterene, hydrochlorothiazide, Synthroid, citalopram, lovastatin, and potassium.  FAMILY HISTORY:  Mother died at 51 colon cancer.  Father 36 with an MI.  PAST SURGICAL HISTORY:  Cataract and left hip pinning.  REVIEW OF SYSTEMS:  Otherwise negative.  PHYSICAL EXAMINATION:  GENERAL:  On exam, she is alert, oriented, cooperative, not in any acute distress.  She is pleasant. HEENT:  Pupils are equal.  Extraocular muscles are intact. NECK:  She does not have any cervical lymphadenopathy. LUNGS:  Her breathing is unlabored. HEART:  Rate is regular. EXTREMITIES:  The bilateral lower extremity has chronic venous disease with varicose veins and hemosiderosis.  Pulses are present.  There is no wound on the left leg. She has some dry skin and some blisters on her toes.  ASSESSMENT AND PLAN:  We spent time talking about protein intake, wound care, foot care, compression and elevation, multivitamin, vitamin C, zinc, and recommendation is for compression Bag Balm for moisture, elevation, and follow up as needed.     Theodoro Kos, DO     CS/MEDQ  D:  11/23/2013  T:  11/24/2013  Job:   [id number]

## 2013-12-02 DIAGNOSIS — M79609 Pain in unspecified limb: Secondary | ICD-10-CM | POA: Diagnosis not present

## 2013-12-02 DIAGNOSIS — I8310 Varicose veins of unspecified lower extremity with inflammation: Secondary | ICD-10-CM | POA: Diagnosis not present

## 2014-01-06 DIAGNOSIS — M79609 Pain in unspecified limb: Secondary | ICD-10-CM | POA: Diagnosis not present

## 2014-01-06 DIAGNOSIS — I8310 Varicose veins of unspecified lower extremity with inflammation: Secondary | ICD-10-CM | POA: Diagnosis not present

## 2014-01-07 DIAGNOSIS — H1044 Vernal conjunctivitis: Secondary | ICD-10-CM | POA: Diagnosis not present

## 2014-02-03 DIAGNOSIS — M7981 Nontraumatic hematoma of soft tissue: Secondary | ICD-10-CM | POA: Diagnosis not present

## 2014-02-03 DIAGNOSIS — I8310 Varicose veins of unspecified lower extremity with inflammation: Secondary | ICD-10-CM | POA: Diagnosis not present

## 2014-02-03 DIAGNOSIS — M79609 Pain in unspecified limb: Secondary | ICD-10-CM | POA: Diagnosis not present

## 2014-03-11 DIAGNOSIS — Z23 Encounter for immunization: Secondary | ICD-10-CM | POA: Diagnosis not present

## 2014-03-24 DIAGNOSIS — I87322 Chronic venous hypertension (idiopathic) with inflammation of left lower extremity: Secondary | ICD-10-CM | POA: Diagnosis not present

## 2014-03-24 DIAGNOSIS — I8312 Varicose veins of left lower extremity with inflammation: Secondary | ICD-10-CM | POA: Diagnosis not present

## 2014-03-24 DIAGNOSIS — I83202 Varicose veins of unspecified lower extremity with both ulcer of calf and inflammation: Secondary | ICD-10-CM | POA: Diagnosis not present

## 2014-04-21 DIAGNOSIS — I10 Essential (primary) hypertension: Secondary | ICD-10-CM | POA: Diagnosis not present

## 2014-04-21 DIAGNOSIS — M810 Age-related osteoporosis without current pathological fracture: Secondary | ICD-10-CM | POA: Diagnosis not present

## 2014-04-21 DIAGNOSIS — I83892 Varicose veins of left lower extremities with other complications: Secondary | ICD-10-CM | POA: Diagnosis not present

## 2014-04-21 DIAGNOSIS — E039 Hypothyroidism, unspecified: Secondary | ICD-10-CM | POA: Diagnosis not present

## 2014-04-21 DIAGNOSIS — I83203 Varicose veins of unspecified lower extremity with both ulcer of ankle and inflammation: Secondary | ICD-10-CM | POA: Diagnosis not present

## 2014-04-22 DIAGNOSIS — Z0000 Encounter for general adult medical examination without abnormal findings: Secondary | ICD-10-CM | POA: Diagnosis not present

## 2014-04-22 DIAGNOSIS — Z1389 Encounter for screening for other disorder: Secondary | ICD-10-CM | POA: Diagnosis not present

## 2014-04-22 DIAGNOSIS — Z23 Encounter for immunization: Secondary | ICD-10-CM | POA: Diagnosis not present

## 2014-04-29 DIAGNOSIS — I10 Essential (primary) hypertension: Secondary | ICD-10-CM | POA: Diagnosis not present

## 2014-04-29 DIAGNOSIS — E039 Hypothyroidism, unspecified: Secondary | ICD-10-CM | POA: Diagnosis not present

## 2014-04-29 DIAGNOSIS — N183 Chronic kidney disease, stage 3 (moderate): Secondary | ICD-10-CM | POA: Diagnosis not present

## 2014-04-29 DIAGNOSIS — E785 Hyperlipidemia, unspecified: Secondary | ICD-10-CM | POA: Diagnosis not present

## 2014-05-05 DIAGNOSIS — R2241 Localized swelling, mass and lump, right lower limb: Secondary | ICD-10-CM | POA: Diagnosis not present

## 2014-05-26 DIAGNOSIS — M79651 Pain in right thigh: Secondary | ICD-10-CM | POA: Diagnosis not present

## 2014-05-26 DIAGNOSIS — I8311 Varicose veins of right lower extremity with inflammation: Secondary | ICD-10-CM | POA: Diagnosis not present

## 2014-05-26 DIAGNOSIS — M79604 Pain in right leg: Secondary | ICD-10-CM | POA: Diagnosis not present

## 2014-06-25 DIAGNOSIS — I8311 Varicose veins of right lower extremity with inflammation: Secondary | ICD-10-CM | POA: Diagnosis not present

## 2014-06-28 DIAGNOSIS — I8311 Varicose veins of right lower extremity with inflammation: Secondary | ICD-10-CM | POA: Diagnosis not present

## 2014-07-07 DIAGNOSIS — I8311 Varicose veins of right lower extremity with inflammation: Secondary | ICD-10-CM | POA: Diagnosis not present

## 2014-07-23 DIAGNOSIS — I8311 Varicose veins of right lower extremity with inflammation: Secondary | ICD-10-CM | POA: Diagnosis not present

## 2014-08-09 DIAGNOSIS — H26493 Other secondary cataract, bilateral: Secondary | ICD-10-CM | POA: Diagnosis not present

## 2014-08-16 DIAGNOSIS — J018 Other acute sinusitis: Secondary | ICD-10-CM | POA: Diagnosis not present

## 2014-09-08 DIAGNOSIS — I8311 Varicose veins of right lower extremity with inflammation: Secondary | ICD-10-CM | POA: Diagnosis not present

## 2014-09-28 DIAGNOSIS — L570 Actinic keratosis: Secondary | ICD-10-CM | POA: Diagnosis not present

## 2014-09-28 DIAGNOSIS — L308 Other specified dermatitis: Secondary | ICD-10-CM | POA: Diagnosis not present

## 2014-09-28 DIAGNOSIS — L821 Other seborrheic keratosis: Secondary | ICD-10-CM | POA: Diagnosis not present

## 2014-10-27 DIAGNOSIS — M810 Age-related osteoporosis without current pathological fracture: Secondary | ICD-10-CM | POA: Diagnosis not present

## 2014-10-27 DIAGNOSIS — I10 Essential (primary) hypertension: Secondary | ICD-10-CM | POA: Diagnosis not present

## 2014-10-27 DIAGNOSIS — E039 Hypothyroidism, unspecified: Secondary | ICD-10-CM | POA: Diagnosis not present

## 2014-11-04 DIAGNOSIS — E039 Hypothyroidism, unspecified: Secondary | ICD-10-CM | POA: Diagnosis not present

## 2014-11-04 DIAGNOSIS — R350 Frequency of micturition: Secondary | ICD-10-CM | POA: Diagnosis not present

## 2014-11-04 DIAGNOSIS — N183 Chronic kidney disease, stage 3 (moderate): Secondary | ICD-10-CM | POA: Diagnosis not present

## 2014-11-04 DIAGNOSIS — I10 Essential (primary) hypertension: Secondary | ICD-10-CM | POA: Diagnosis not present

## 2014-11-04 DIAGNOSIS — E785 Hyperlipidemia, unspecified: Secondary | ICD-10-CM | POA: Diagnosis not present

## 2015-03-04 DIAGNOSIS — Z23 Encounter for immunization: Secondary | ICD-10-CM | POA: Diagnosis not present

## 2015-03-16 DIAGNOSIS — M75111 Incomplete rotator cuff tear or rupture of right shoulder, not specified as traumatic: Secondary | ICD-10-CM | POA: Diagnosis not present

## 2015-03-16 DIAGNOSIS — M5412 Radiculopathy, cervical region: Secondary | ICD-10-CM | POA: Diagnosis not present

## 2015-03-16 DIAGNOSIS — M25511 Pain in right shoulder: Secondary | ICD-10-CM | POA: Diagnosis not present

## 2015-03-22 ENCOUNTER — Other Ambulatory Visit: Payer: Self-pay | Admitting: Orthopedic Surgery

## 2015-03-22 DIAGNOSIS — M25511 Pain in right shoulder: Secondary | ICD-10-CM | POA: Diagnosis not present

## 2015-03-22 DIAGNOSIS — R5381 Other malaise: Secondary | ICD-10-CM

## 2015-03-22 DIAGNOSIS — S46011D Strain of muscle(s) and tendon(s) of the rotator cuff of right shoulder, subsequent encounter: Secondary | ICD-10-CM | POA: Diagnosis not present

## 2015-03-23 DIAGNOSIS — S46011D Strain of muscle(s) and tendon(s) of the rotator cuff of right shoulder, subsequent encounter: Secondary | ICD-10-CM | POA: Diagnosis not present

## 2015-03-23 DIAGNOSIS — M25511 Pain in right shoulder: Secondary | ICD-10-CM | POA: Diagnosis not present

## 2015-03-29 DIAGNOSIS — M25511 Pain in right shoulder: Secondary | ICD-10-CM | POA: Diagnosis not present

## 2015-03-29 DIAGNOSIS — S46011D Strain of muscle(s) and tendon(s) of the rotator cuff of right shoulder, subsequent encounter: Secondary | ICD-10-CM | POA: Diagnosis not present

## 2015-04-01 DIAGNOSIS — S46011D Strain of muscle(s) and tendon(s) of the rotator cuff of right shoulder, subsequent encounter: Secondary | ICD-10-CM | POA: Diagnosis not present

## 2015-04-01 DIAGNOSIS — M25511 Pain in right shoulder: Secondary | ICD-10-CM | POA: Diagnosis not present

## 2015-04-05 DIAGNOSIS — M25511 Pain in right shoulder: Secondary | ICD-10-CM | POA: Diagnosis not present

## 2015-04-05 DIAGNOSIS — S46011D Strain of muscle(s) and tendon(s) of the rotator cuff of right shoulder, subsequent encounter: Secondary | ICD-10-CM | POA: Diagnosis not present

## 2015-04-12 DIAGNOSIS — S46011D Strain of muscle(s) and tendon(s) of the rotator cuff of right shoulder, subsequent encounter: Secondary | ICD-10-CM | POA: Diagnosis not present

## 2015-04-12 DIAGNOSIS — M25511 Pain in right shoulder: Secondary | ICD-10-CM | POA: Diagnosis not present

## 2015-04-13 ENCOUNTER — Other Ambulatory Visit: Payer: Self-pay | Admitting: Orthopedic Surgery

## 2015-04-13 DIAGNOSIS — M8580 Other specified disorders of bone density and structure, unspecified site: Secondary | ICD-10-CM

## 2015-04-14 DIAGNOSIS — M25511 Pain in right shoulder: Secondary | ICD-10-CM | POA: Diagnosis not present

## 2015-04-14 DIAGNOSIS — S46011D Strain of muscle(s) and tendon(s) of the rotator cuff of right shoulder, subsequent encounter: Secondary | ICD-10-CM | POA: Diagnosis not present

## 2015-05-02 DIAGNOSIS — I10 Essential (primary) hypertension: Secondary | ICD-10-CM | POA: Diagnosis not present

## 2015-05-02 DIAGNOSIS — E559 Vitamin D deficiency, unspecified: Secondary | ICD-10-CM | POA: Diagnosis not present

## 2015-05-02 DIAGNOSIS — E039 Hypothyroidism, unspecified: Secondary | ICD-10-CM | POA: Diagnosis not present

## 2015-05-02 DIAGNOSIS — M810 Age-related osteoporosis without current pathological fracture: Secondary | ICD-10-CM | POA: Diagnosis not present

## 2015-05-05 DIAGNOSIS — I129 Hypertensive chronic kidney disease with stage 1 through stage 4 chronic kidney disease, or unspecified chronic kidney disease: Secondary | ICD-10-CM | POA: Diagnosis not present

## 2015-05-05 DIAGNOSIS — Z1389 Encounter for screening for other disorder: Secondary | ICD-10-CM | POA: Diagnosis not present

## 2015-05-05 DIAGNOSIS — Z0000 Encounter for general adult medical examination without abnormal findings: Secondary | ICD-10-CM | POA: Diagnosis not present

## 2015-05-05 DIAGNOSIS — E785 Hyperlipidemia, unspecified: Secondary | ICD-10-CM | POA: Diagnosis not present

## 2015-05-09 DIAGNOSIS — I129 Hypertensive chronic kidney disease with stage 1 through stage 4 chronic kidney disease, or unspecified chronic kidney disease: Secondary | ICD-10-CM | POA: Diagnosis not present

## 2015-05-09 DIAGNOSIS — F17211 Nicotine dependence, cigarettes, in remission: Secondary | ICD-10-CM | POA: Diagnosis not present

## 2015-05-09 DIAGNOSIS — I509 Heart failure, unspecified: Secondary | ICD-10-CM | POA: Diagnosis not present

## 2015-05-09 DIAGNOSIS — E785 Hyperlipidemia, unspecified: Secondary | ICD-10-CM | POA: Diagnosis not present

## 2015-05-09 DIAGNOSIS — F339 Major depressive disorder, recurrent, unspecified: Secondary | ICD-10-CM | POA: Diagnosis not present

## 2015-05-16 DIAGNOSIS — S46011D Strain of muscle(s) and tendon(s) of the rotator cuff of right shoulder, subsequent encounter: Secondary | ICD-10-CM | POA: Diagnosis not present

## 2015-05-16 DIAGNOSIS — M25511 Pain in right shoulder: Secondary | ICD-10-CM | POA: Diagnosis not present

## 2015-05-17 ENCOUNTER — Other Ambulatory Visit: Payer: PRIVATE HEALTH INSURANCE

## 2015-06-22 DIAGNOSIS — M25552 Pain in left hip: Secondary | ICD-10-CM | POA: Diagnosis not present

## 2015-07-08 DIAGNOSIS — M1612 Unilateral primary osteoarthritis, left hip: Secondary | ICD-10-CM | POA: Diagnosis not present

## 2015-07-19 DIAGNOSIS — M1612 Unilateral primary osteoarthritis, left hip: Secondary | ICD-10-CM | POA: Diagnosis not present

## 2015-08-01 DIAGNOSIS — H26493 Other secondary cataract, bilateral: Secondary | ICD-10-CM | POA: Diagnosis not present

## 2015-10-28 DIAGNOSIS — N390 Urinary tract infection, site not specified: Secondary | ICD-10-CM | POA: Diagnosis not present

## 2015-10-28 DIAGNOSIS — R300 Dysuria: Secondary | ICD-10-CM | POA: Diagnosis not present

## 2015-10-31 DIAGNOSIS — E785 Hyperlipidemia, unspecified: Secondary | ICD-10-CM | POA: Diagnosis not present

## 2015-10-31 DIAGNOSIS — E559 Vitamin D deficiency, unspecified: Secondary | ICD-10-CM | POA: Diagnosis not present

## 2015-10-31 DIAGNOSIS — E039 Hypothyroidism, unspecified: Secondary | ICD-10-CM | POA: Diagnosis not present

## 2015-10-31 DIAGNOSIS — I129 Hypertensive chronic kidney disease with stage 1 through stage 4 chronic kidney disease, or unspecified chronic kidney disease: Secondary | ICD-10-CM | POA: Diagnosis not present

## 2015-11-07 DIAGNOSIS — N183 Chronic kidney disease, stage 3 (moderate): Secondary | ICD-10-CM | POA: Diagnosis not present

## 2015-11-07 DIAGNOSIS — F3341 Major depressive disorder, recurrent, in partial remission: Secondary | ICD-10-CM | POA: Diagnosis not present

## 2015-11-07 DIAGNOSIS — E785 Hyperlipidemia, unspecified: Secondary | ICD-10-CM | POA: Diagnosis not present

## 2015-11-07 DIAGNOSIS — F17211 Nicotine dependence, cigarettes, in remission: Secondary | ICD-10-CM | POA: Diagnosis not present

## 2015-11-07 DIAGNOSIS — I129 Hypertensive chronic kidney disease with stage 1 through stage 4 chronic kidney disease, or unspecified chronic kidney disease: Secondary | ICD-10-CM | POA: Diagnosis not present

## 2015-11-08 ENCOUNTER — Other Ambulatory Visit (HOSPITAL_COMMUNITY): Payer: Self-pay | Admitting: Internal Medicine

## 2015-11-08 DIAGNOSIS — I509 Heart failure, unspecified: Secondary | ICD-10-CM

## 2015-11-22 ENCOUNTER — Other Ambulatory Visit (HOSPITAL_COMMUNITY): Payer: Medicare Other

## 2015-12-12 DIAGNOSIS — F3341 Major depressive disorder, recurrent, in partial remission: Secondary | ICD-10-CM | POA: Diagnosis not present

## 2015-12-12 DIAGNOSIS — I129 Hypertensive chronic kidney disease with stage 1 through stage 4 chronic kidney disease, or unspecified chronic kidney disease: Secondary | ICD-10-CM | POA: Diagnosis not present

## 2016-03-20 ENCOUNTER — Encounter: Payer: Self-pay | Admitting: Radiology

## 2016-03-20 NOTE — Progress Notes (Signed)
Made 2 attempts to schedule echocardiogram with patient. Patient states she is not ready to have the echocardiogram right now. Please reorder when patient is ready.

## 2016-03-23 DIAGNOSIS — Z23 Encounter for immunization: Secondary | ICD-10-CM | POA: Diagnosis not present

## 2016-04-16 ENCOUNTER — Encounter: Payer: Self-pay | Admitting: Internal Medicine

## 2016-04-16 DIAGNOSIS — I129 Hypertensive chronic kidney disease with stage 1 through stage 4 chronic kidney disease, or unspecified chronic kidney disease: Secondary | ICD-10-CM | POA: Diagnosis not present

## 2016-04-16 DIAGNOSIS — Z0000 Encounter for general adult medical examination without abnormal findings: Secondary | ICD-10-CM | POA: Diagnosis not present

## 2016-04-16 DIAGNOSIS — E039 Hypothyroidism, unspecified: Secondary | ICD-10-CM | POA: Diagnosis not present

## 2016-04-16 DIAGNOSIS — M810 Age-related osteoporosis without current pathological fracture: Secondary | ICD-10-CM | POA: Diagnosis not present

## 2016-04-16 DIAGNOSIS — E785 Hyperlipidemia, unspecified: Secondary | ICD-10-CM | POA: Diagnosis not present

## 2016-04-16 LAB — CBC AND DIFFERENTIAL
HEMATOCRIT: 38 % (ref 36–46)
HEMOGLOBIN: 12 g/dL (ref 12.0–16.0)
Platelets: 236 10*3/uL (ref 150–399)
WBC: 5.7 10^3/mL

## 2016-04-16 LAB — TSH: TSH: 2.5 u[IU]/mL (ref 0.41–5.90)

## 2016-04-16 LAB — BASIC METABOLIC PANEL
BUN: 21 mg/dL (ref 4–21)
Creatinine: 0.9 mg/dL (ref 0.5–1.1)
GLUCOSE: 90 mg/dL
POTASSIUM: 4.6 mmol/L (ref 3.4–5.3)
SODIUM: 143 mmol/L (ref 137–147)

## 2016-04-16 LAB — HEPATIC FUNCTION PANEL
ALT: 9 U/L (ref 7–35)
AST: 16 U/L (ref 13–35)
Alkaline Phosphatase: 91 U/L (ref 25–125)
BILIRUBIN, TOTAL: 0.3 mg/dL

## 2016-04-16 LAB — LIPID PANEL
Cholesterol: 191 mg/dL (ref 0–200)
HDL: 75 mg/dL — AB (ref 35–70)
LDL CALC: 104 mg/dL
TRIGLYCERIDES: 61 mg/dL (ref 40–160)

## 2016-04-18 ENCOUNTER — Non-Acute Institutional Stay: Payer: Medicare Other | Admitting: Internal Medicine

## 2016-04-18 ENCOUNTER — Encounter: Payer: Self-pay | Admitting: Internal Medicine

## 2016-04-18 VITALS — BP 120/70 | HR 68 | Temp 98.4°F | Ht 67.0 in | Wt 170.0 lb

## 2016-04-18 DIAGNOSIS — M810 Age-related osteoporosis without current pathological fracture: Secondary | ICD-10-CM

## 2016-04-18 DIAGNOSIS — I10 Essential (primary) hypertension: Secondary | ICD-10-CM | POA: Diagnosis not present

## 2016-04-18 DIAGNOSIS — E876 Hypokalemia: Secondary | ICD-10-CM | POA: Diagnosis not present

## 2016-04-18 DIAGNOSIS — M1612 Unilateral primary osteoarthritis, left hip: Secondary | ICD-10-CM

## 2016-04-18 DIAGNOSIS — N189 Chronic kidney disease, unspecified: Secondary | ICD-10-CM

## 2016-04-18 DIAGNOSIS — E559 Vitamin D deficiency, unspecified: Secondary | ICD-10-CM

## 2016-04-18 DIAGNOSIS — E039 Hypothyroidism, unspecified: Secondary | ICD-10-CM

## 2016-04-18 DIAGNOSIS — E785 Hyperlipidemia, unspecified: Secondary | ICD-10-CM

## 2016-04-18 MED ORDER — CHOLECALCIFEROL 50 MCG (2000 UT) PO CAPS: 2000.0000 [IU] | ORAL_CAPSULE | Freq: Every day | ORAL | 5 refills | Status: DC

## 2016-04-18 MED ORDER — ACETAMINOPHEN 500 MG PO TABS: 500.0000 mg | ORAL_TABLET | Freq: Every day | ORAL | 0 refills | Status: DC | PRN

## 2016-04-18 NOTE — Progress Notes (Signed)
Provider:  Rexene Edison. Mariea Clonts, D.O., C.M.D. Location:  Occupational psychologist of Service:  Clinic (12)  Previous PCP: No primary care provider on file. No care team member to display  Extended Emergency Contact Information Primary Emergency Contact: [facility] Address: [address]          [place]  [place] Home Phone: [phone] Relation: None  Code Status: DNR Goals of Care: Advanced Directive information Advanced Directives 04/18/2016  Does patient have an advance directive? Yes  Type of Paramedic of [place];Living will;Out of facility DNR (pink MOST or yellow form)  Copy of advanced directive(s) in chart? No - copy requested    Chief Complaint  Patient presents with  . Establish Care    new patient    HPI: Patient is a 82 y.o. female seen today to establish with [facility].  Records have been requested from her previous PCP, Dr. Trilby Drummer ([place]), who is now ill. She had gone back in town to see him since Dr. Nyoka Cowden retired.  She moved here to [place] as of 02/16/16.  She had a lot of changes in one year since losing her husband.  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  He then got horrendous shingles, wound up on hospice care.  He had terrible pain.     She got into [facility] after her husband's death.  She and her three dogs live here.  [place] greyhounds--all three older.  She's unable to walk them.  She had a fall in the past with hip fx when walking them in the past.  Dr. Theda Sers did the original surgery (?)  She does not walk well.  She had a left hip cortisone shot that worked very well by a Social worker of his.  Having more pain when getting up and sitting down.  Left leg.  She swims every day at [employer].  Pool here is smaller.  She is not a big social joiner.  She gets embarassed about her periods of crying when she thinks about her husband.  She does have a few friends she has  dinner with and her daughter comes to visit.  She's an Secondary school teacher.  She sold her one [employer] on [address].  She has one more book she plans to do.  She's hoping to get an intern from [employer] to do this.    When she saw her PCP last, her renal function was not as good as it should be.  She is hydrating better now.  Does have to get 4-5x per night.  She does keep a water bottle at bedside.  She had been on a kick of diet juice/green tea.  She did stop those.  Has 2 cups per day of coffee.      Aleve helps her walking and she takes it almost daily.  She is unsteady when she starts to stand and reports having had bursitis at one point in the left hip.  Pain radiates into her groin.  She previously had a fracture of the left hip and is s/p ORIF 04/08/11  Past Medical History:  Diagnosis Date  . Closed left femoral fracture ([place]) 2012   s/p ORIF, Dr. Tamera Punt  . Closed left radial fracture 2012   s/p reduction   Past Surgical History:  Procedure Laterality Date  . ABDOMINAL HYSTERECTOMY    . EYE SURGERY    . ORIF FEMUR FRACTURE  Left 04/08/2011   Dr. Tamera Punt    Social History   Social History  . Marital status: Married    Spouse name: N/A  . Number of children: N/A  . Years of education: N/A   Social History Main Topics  . Smoking status: Never Smoker  . Smokeless tobacco: Never Used  . Alcohol use Yes  . Drug use: No  . Sexual activity: No   Other Topics Concern  . None   Social History Narrative   Tobacco use, amount per day now: NONE   Past tobacco use, amount per day: NONE   How many years did you use tobacco: NONE   Alcohol use (drinks per week): 1   Diet: ONGOING   Do you drink/eat things with caffeine: YES   Marital status: WIDOWED                       What year were you married? 1956   Do you live in a house, apartment, assisted living, condo, trailer, etc.? [place] ([place])   Is it one or more stories? ONE   How many  persons live in your home? ONE   Do you have pets in your home?( please list) 3 DOGS   Current or past profession: PUBLISHER   Do you exercise?   YES                               Type and how often? SWIM DAILY   Do you have a living will? YES   Do you have a DNR form?   YES                                If not, do you want to discuss one?   Do you have signed POA/HPOA forms?  YES                      If so, please bring to you appointment       reports that she has never smoked. She has never used smokeless tobacco. She reports that she drinks alcohol. She reports that she does not use drugs.  Functional Status Survey:    Family History  Problem Relation Age of Onset  . Cancer Mother   . Heart disease Father     Health Maintenance  Topic Date Due  . TETANUS/TDAP  06/22/1949  . ZOSTAVAX  06/22/1990  . DEXA SCAN  06/23/1995  . PNA vac Low Risk Adult (1 of 2 - PCV13) 06/23/1995  . INFLUENZA VACCINE  Completed    Allergies  Allergen Reactions  . Penicillins       Medication List       Accurate as of 04/18/16 11:59 PM. Always use your most recent med list.          acetaminophen 500 MG tablet Commonly known as:  TYLENOL Take 1 tablet (500 mg total) by mouth daily as needed for mild pain.   aspirin EC 81 MG tablet Take 81 mg by mouth daily.   Cholecalciferol 2000 units Caps Take 1 capsule (2,000 Units total) by mouth daily.   levothyroxine 25 MCG tablet Commonly known as:  SYNTHROID, LEVOTHROID Take 25 mcg by mouth daily before breakfast.   lovastatin 20 MG tablet Commonly known as:  MEVACOR Take 20 mg by mouth at  bedtime.   Potassium 75 MG Tabs Take by mouth daily.   triamterene-hydrochlorothiazide 37.5-25 MG tablet Commonly known as:  MAXZIDE-25 Take 1 tablet by mouth daily.       Review of Systems  Constitutional: Negative for chills, diaphoresis, fever, malaise/fatigue and weight loss.  HENT: Positive for hearing loss. Negative for congestion.          Dry mouth  Eyes: Negative for blurred vision.       S/p cataract surgery, wears reading glasses  Respiratory: Negative for cough and shortness of breath.   Cardiovascular: Negative for chest pain, palpitations and leg swelling.  Gastrointestinal: Negative for abdominal pain, blood in stool, constipation, diarrhea and melena.  Genitourinary: Negative for dysuria, frequency and urgency.       Weak urinary stream  Musculoskeletal: Positive for joint pain. Negative for falls.       Left hip pain; no falls since hip and wrist fxs; unsteady gait especially when getting started upon standing  Skin: Negative for rash.  Neurological: Negative for dizziness, loss of consciousness, weakness and headaches.  Endo/Heme/Allergies: Bruises/bleeds easily.  Psychiatric/Behavioral: Positive for depression. Negative for memory loss. The patient is not nervous/anxious and does not have insomnia.        Tearful when talking about her husband    Vitals:   04/18/16 1421  BP: 120/70  Pulse: 68  Temp: 98.4 F (36.9 C)  TempSrc: Oral  SpO2: 98%  Weight: 170 lb (77.1 kg)  Height: 5' 7"  (1.702 m)   Body mass index is 26.63 kg/m. Physical Exam  Constitutional: She is oriented to person, place, and time. She appears well-developed and well-nourished. No distress.  Cardiovascular: Normal rate, regular rhythm, normal heart sounds and intact distal pulses.   Pulmonary/Chest: Effort normal and breath sounds normal. No respiratory distress.  Abdominal: Soft. Bowel sounds are normal. She exhibits no distension. There is no tenderness.  Musculoskeletal:  Left groin tenderness; no tenderness over bursa; is unsteady when starting to walk  Neurological: She is alert and oriented to person, place, and time.  Skin: Skin is warm and dry.    Labs reviewed: Basic Metabolic Panel: No results for input(s): NA, K, CL, CO2, GLUCOSE, BUN, CREATININE, CALCIUM, MG, PHOS in the last 8760 hours. Liver Function  Tests: No results for input(s): AST, ALT, ALKPHOS, BILITOT, PROT, ALBUMIN in the last 8760 hours. No results for input(s): LIPASE, AMYLASE in the last 8760 hours. No results for input(s): AMMONIA in the last 8760 hours. CBC: No results for input(s): WBC, NEUTROABS, HGB, HCT, MCV, PLT in the last 8760 hours. Cardiac Enzymes: No results for input(s): CKTOTAL, CKMB, CKMBINDEX, TROPONINI in the last 8760 hours. BNP: Invalid input(s): POCBNP Lab Results  Component Value Date   HGBA1C 5.7 (H) 04/10/2011   No results found for: TSH No results found for: VITAMINB12 No results found for: FOLATE No results found for: IRON, TIBC, FERRITIN  Imaging and Procedures noted on new patient packet: Yearly mammograms  Assessment/Plan 1. Arthritis of left hip - she requests to see Dr. Tamera Punt for a possible steroid injection in the hip joint -seems more arthritic to me than bursitis - AMB referral to orthopedics  2. Hypothyroidism, unspecified type -cont current synthroid, need records from [facility] to see when last tsh was done  3. Hyperlipidemia, unspecified hyperlipidemia type - cont mevacor and await records for last lipids  4. Essential hypertension -bp well controlled on current regimen -wonder if maxzide causing her hypokalemia though so a switch  might prevent the need for the supplement  5. Hypokalemia -cont potassium for now and see about last labs  6. Vitamin D deficiency -cont vitamin D3 supplement and encouraged walking  7. Chronic kidney disease, unspecified CKD stage -per pt, her renal function had declined at her last appt--need notes and labs to see if this is for certain what she meant  8. Senile osteoporosis -by virtue of prior left hip and wrist fractures -cont vit D, weightbearing exercise and should be on therapy -need to see if she's had bone density, when and if she's taken prior therapy  Labs/tests ordered:  Will order depending on last time labs done  and if more needed  F/u 3 mos  Maude Hettich L. Darlean Warmoth, D.O. [facility] [address], [place] [address] Cell Phone (Mon-Fri 8am-5pm):  [phone] On Call:  [phone] & follow prompts after 5pm & weekends Office Phone:  [phone] Office Fax:  [phone]

## 2016-04-21 ENCOUNTER — Encounter: Payer: Self-pay | Admitting: Internal Medicine

## 2016-04-21 DIAGNOSIS — I10 Essential (primary) hypertension: Secondary | ICD-10-CM | POA: Insufficient documentation

## 2016-04-21 DIAGNOSIS — E559 Vitamin D deficiency, unspecified: Secondary | ICD-10-CM | POA: Insufficient documentation

## 2016-04-21 DIAGNOSIS — E039 Hypothyroidism, unspecified: Secondary | ICD-10-CM | POA: Insufficient documentation

## 2016-04-21 DIAGNOSIS — E876 Hypokalemia: Secondary | ICD-10-CM | POA: Insufficient documentation

## 2016-04-21 DIAGNOSIS — E785 Hyperlipidemia, unspecified: Secondary | ICD-10-CM | POA: Insufficient documentation

## 2016-04-21 NOTE — Patient Instructions (Signed)
Stop aleve and try tylenol extra strength for your joint pain.

## 2016-05-04 DIAGNOSIS — M1612 Unilateral primary osteoarthritis, left hip: Secondary | ICD-10-CM | POA: Diagnosis not present

## 2016-05-22 DIAGNOSIS — M1612 Unilateral primary osteoarthritis, left hip: Secondary | ICD-10-CM | POA: Diagnosis not present

## 2016-06-27 ENCOUNTER — Encounter: Payer: Self-pay | Admitting: Internal Medicine

## 2016-06-27 ENCOUNTER — Non-Acute Institutional Stay: Payer: Medicare Other | Admitting: Internal Medicine

## 2016-06-27 VITALS — BP 120/70 | HR 71 | Temp 98.6°F | Ht 67.0 in | Wt 171.0 lb

## 2016-06-27 DIAGNOSIS — M6281 Muscle weakness (generalized): Secondary | ICD-10-CM

## 2016-06-27 DIAGNOSIS — I10 Essential (primary) hypertension: Secondary | ICD-10-CM | POA: Diagnosis not present

## 2016-06-27 DIAGNOSIS — E039 Hypothyroidism, unspecified: Secondary | ICD-10-CM | POA: Diagnosis not present

## 2016-06-27 DIAGNOSIS — E785 Hyperlipidemia, unspecified: Secondary | ICD-10-CM | POA: Diagnosis not present

## 2016-06-27 DIAGNOSIS — M1612 Unilateral primary osteoarthritis, left hip: Secondary | ICD-10-CM | POA: Diagnosis not present

## 2016-06-27 NOTE — Progress Notes (Signed)
Location:  Occupational psychologist of Service:  Clinic (12)  Provider: Kron Everton L. Mariea Clonts, D.O., C.M.D.  Code Status: DNR Goals of Care:  Advanced Directives 06/27/2016  Does Patient Have a Medical Advance Directive? Yes  Type of Paramedic of [place];Living will  Copy of [place] in Chart? Yes     Chief Complaint  Patient presents with  . Medical Management of Chronic Issues    2 mth follow-up, left hip pain want to discuss surgery    HPI: Patient is a 83 y.o. [place] seen today for medical management of chronic diseases.    She went to ortho for an injection in the left hip right before she came here the first time (11/1) and it was not successful. Dr. Mina Marble had done it.  Since moving here, it's easier for her to get help with her dog.  She is thinking she needs an ortho referral for surgery.  She got off her aleve and is now taking two tylenol in the morning and one at night.  Discussed no more than 6 extra strength tylenol per day.  She knows she has bone on bone in that left hip.  She has seen Dr. Tamera Punt before.  She met him when her dogs pushed her over and she wound up in the OR.    Since the weather was very cold, she had a bloody nose, ran to the bathroom and blocked up her nose after 20 mins.  Did subside after that and some ice.  She had clots coming out two days later.  She bought a humidifier and has to mess with it every day.    She has started a new book and she is excited about it.  It's lifted her emotionally.    She feels very tired.  The walking is wearing her out due to her hip pain.   Past Medical History:  Diagnosis Date  . Closed left femoral fracture ([place]) 2012   s/p ORIF, Dr. Tamera Punt  . Closed left radial fracture 2012   s/p reduction  . Essential hypertension   . Hyperlipidemia   . Hypothyroidism     Past Surgical History:  Procedure Laterality Date  . ABDOMINAL HYSTERECTOMY    .  CATARACT EXTRACTION W/ INTRAOCULAR LENS  IMPLANT, BILATERAL Bilateral 2014  . ORIF FEMUR FRACTURE Left 04/08/2011   Dr. Tamera Punt    Allergies  Allergen Reactions  . Penicillins     Allergies as of 06/27/2016      Reactions   Penicillins       Medication List       Accurate as of 06/27/16 11:59 PM. Always use your most recent med list.          acetaminophen 500 MG tablet Commonly known as:  TYLENOL Take 1 tablet (500 mg total) by mouth daily as needed for mild pain.   Cholecalciferol 2000 units Caps Take 1 capsule (2,000 Units total) by mouth daily.   levothyroxine 25 MCG tablet Commonly known as:  SYNTHROID, LEVOTHROID Take 25 mcg by mouth daily before breakfast.   lovastatin 20 MG tablet Commonly known as:  MEVACOR Take 20 mg by mouth at bedtime.   Potassium 75 MG Tabs Take by mouth daily.   triamterene-hydrochlorothiazide 37.5-25 MG tablet Commonly known as:  MAXZIDE-25 Take 1 tablet by mouth daily.       Review of Systems:  Review of Systems  Constitutional: Negative for chills, fever and  malaise/fatigue.  HENT: Positive for nosebleeds. Negative for hearing loss.   Eyes: Negative for blurred vision.  Respiratory: Negative for cough and shortness of breath.   Cardiovascular: Negative for chest pain, palpitations and leg swelling.  Gastrointestinal: Negative for abdominal pain, blood in stool, constipation and melena.  Genitourinary: Negative for dysuria, frequency and urgency.  Musculoskeletal: Positive for joint pain. Negative for back pain, falls and myalgias.  Skin: Negative for itching and rash.  Neurological: Positive for weakness. Negative for dizziness and loss of consciousness.  Endo/Heme/Allergies: Bruises/bleeds easily.  Psychiatric/Behavioral: Negative for depression, memory loss and suicidal ideas. The patient is nervous/anxious.     Health Maintenance  Topic Date Due  . TETANUS/TDAP  06[birth year]1949  . ZOSTAVAX  06/22/1990  . DEXA SCAN   06/23/1995  . PNA vac Low Risk Adult (1 of 2 - PCV13) 06/23/1995  . INFLUENZA VACCINE  Completed    Physical Exam: Vitals:   06/27/16 1152  BP: 120/70  Pulse: 71  Temp: 98.6 F (37 C)  TempSrc: Oral  SpO2: 95%  Weight: 171 lb (77.6 kg)  Height: '5\' 7"'$  (1.702 m)   Body mass index is 26.78 kg/m. Physical Exam  Constitutional: She is oriented to person, place, and time. She appears well-developed and well-nourished. No distress.  Cardiovascular: Normal rate, regular rhythm, normal heart sounds and intact distal pulses.   Pulmonary/Chest: Effort normal and breath sounds normal. No respiratory distress.  Abdominal: Soft. Bowel sounds are normal. She exhibits no distension.  Musculoskeletal: She exhibits tenderness. She exhibits no edema.  Difficulty getting up out of the chair; also getting started--hard to take first few steps with left hip; tender in left lateral hip into groin area and down lateral thigh  Neurological: She is alert and oriented to person, place, and time. No cranial nerve deficit.  Skin: Skin is warm and dry. Capillary refill takes less than 2 seconds.  Psychiatric: She has a normal mood and affect.    Labs reviewed: Basic Metabolic Panel:  Recent Labs  04/16/16  NA 143  K 4.6  BUN 21  CREATININE 0.9  TSH 2.50   Liver Function Tests:  Recent Labs  04/16/16  AST 16  ALT 9  ALKPHOS 91   No results for input(s): LIPASE, AMYLASE in the last 8760 hours. No results for input(s): AMMONIA in the last 8760 hours. CBC:  Recent Labs  04/16/16  WBC 5.7  HGB 12.0  HCT 38  PLT 236   Lipid Panel:  Recent Labs  04/16/16  CHOL 191  HDL 75*  LDLCALC 104  TRIG 61   Lab Results  Component Value Date   HGBA1C 5.7 (H) 04/10/2011   Vitamin D 25 OH was 31.6 04/16/16, albumin 3.9  Assessment/Plan 1. Arthritis of left hip - end stage, has been told she needs replacement and hip is starting to give out on her, will do some outpatient PT and get her in  with orthopedics - she requests Dr. Tamera Punt who I thought typically did shoulder surgery but she knows him already and feels comfortable with him--we discussed anterior approach hip surgery which is available around here - Ambulatory referral to Orthopedic Surgery  2. Hypothyroidism, unspecified type -cont synthroid and f/u labs  3. Hyperlipidemia, unspecified hyperlipidemia type -cont lovastatin and f/u labs  4. Essential hypertension -bp at goal with current therapy, cont to monitor, cont water exercise  5. Proximal muscle weakness -pt is unsteady walking and has lost a lot of proximal muscle strength--agree with  her that some therapy before getting her left hip replacement is a good idea so Rx written and given to [place] from social work was given pt's info to discuss planning for the hip surgery and inpatient rehab here at [facility] postop  Labs/tests ordered:   Orders Placed This Encounter  Procedures  . CBC and differential    This external order was created through the Results Console.  . Basic metabolic panel    This external order was created through the Results Console.  . Lipid panel    This external order was created through the Results Console.  . Hepatic function panel    This external order was created through the Results Console.  . TSH    This external order was created through the Results Console.    Next appt:  08/22/2016    Tomeika Weinmann L. Aren Pryde, D.O. [facility] [address], [place] [address] Cell Phone (Mon-Fri 8am-5pm):  [phone] On Call:  [phone] & follow prompts after 5pm & weekends Office Phone:  [phone] Office Fax:  [phone]

## 2016-07-05 MED ORDER — LEVOTHYROXINE SODIUM 25 MCG PO TABS: 25.0000 ug | ORAL_TABLET | Freq: Every day | ORAL | 3 refills | Status: DC

## 2016-07-05 MED ORDER — TRIAMTERENE-HCTZ 37.5-25 MG PO TABS: 1.0000 | ORAL_TABLET | Freq: Every day | ORAL | 3 refills | Status: DC

## 2016-07-05 MED ORDER — LOVASTATIN 20 MG PO TABS: 20.0000 mg | ORAL_TABLET | Freq: Every day | ORAL | 3 refills | Status: DC

## 2016-07-12 DIAGNOSIS — M1612 Unilateral primary osteoarthritis, left hip: Secondary | ICD-10-CM | POA: Diagnosis not present

## 2016-08-08 ENCOUNTER — Other Ambulatory Visit: Payer: Self-pay | Admitting: Orthopedic Surgery

## 2016-08-13 NOTE — Pre-Procedure Instructions (Signed)
Amber Bryant  08/13/2016      [facility] [address] [place] [id number] [place] [place] [address] Phone: [phone] Fax: [phone]    Your procedure is scheduled on Mon, Mar 5 @ 10:05 AM  Report to [facility] Admitting at 8:00 AM  Call this number if you have problems the morning of surgery:  [phone]   Remember:  Do not eat food or drink liquids after midnight.  Take these medicines the morning of surgery with A SIP OF WATER Synthroid(Levothyroxine)              Stop taking any Vitamins or Herbal Medications now. No Goody's,BC's,Aleve,Advil,Motrin,Ibuprofen,or Fish Oil.     Do not wear jewelry, make-up or nail polish.  Do not wear lotions, powders,  perfumes, or deoderant.  Do not shave 48 hours prior to surgery.   Do not bring valuables to the hospital.  [facility] is not responsible for any belongings or valuables.  Contacts, dentures or bridgework may not be worn into surgery.  Leave your suitcase in the car.  After surgery it may be brought to your room.  For patients admitted to the hospital, discharge time will be determined by your treatment team.  Patients discharged the day of surgery will not be allowed to drive home.    [facility]  Before surgery, you can play an important role.  Because skin is not sterile, your skin needs to be as free of germs as possible.  You can reduce the number of germs on you skin by washing with CHG (chlorahexidine gluconate) soap before surgery.  CHG is an antiseptic cleaner which kills germs and bonds with the skin to continue killing germs even after washing.  Please DO NOT use if you have an allergy to CHG or antibacterial soaps.  If your skin becomes reddened/irritated stop using the CHG and inform your nurse when you arrive at Short Stay.  Do not shave (including legs and underarms) for at least 48 hours prior to the  first CHG shower.  You may shave your face.  Please follow these instructions carefully:   1.  Shower with CHG Soap the night before surgery and the                                morning of Surgery.  2.  If you choose to wash your hair, wash your hair first as usual with your       normal shampoo.  3.  After you shampoo, rinse your hair and body thoroughly to remove the                      Shampoo.  4.  Use CHG as you would any other liquid soap.  You can apply chg directly       to the skin and wash gently with scrungie or a clean washcloth.  5.  Apply the CHG Soap to your body ONLY FROM THE NECK DOWN.        Do not use on open wounds or open sores.  Avoid contact with your eyes,       ears, mouth and genitals (private parts).  Wash genitals (private parts)       with your normal soap.  6.  Wash thoroughly, paying special attention to the area where your surgery  will be performed.  7.  Thoroughly rinse your body with warm water from the neck down.  8.  DO NOT shower/wash with your normal soap after using and rinsing off       the CHG Soap.  9.  Pat yourself dry with a clean towel.            10.  Wear clean pajamas.            11.  Place clean sheets on your bed the night of your first shower and do not        sleep with pets.  Day of Surgery  Do not apply any lotions/deoderants the morning of surgery.  Please wear clean clothes to the hospital/surgery center.    Please read over the following fact sheets that you were given. Pain Booklet, Coughing and Deep Breathing, MRSA Information and Surgical Site Infection Prevention

## 2016-08-14 ENCOUNTER — Encounter (HOSPITAL_COMMUNITY): Payer: Self-pay

## 2016-08-14 ENCOUNTER — Encounter (HOSPITAL_COMMUNITY)
Admission: RE | Admit: 2016-08-14 | Discharge: 2016-08-14 | Disposition: A | Discharge status: Home / Self Care | Payer: Medicare Other | Source: Ambulatory Visit | Attending: Orthopedic Surgery | Admitting: Orthopedic Surgery

## 2016-08-14 DIAGNOSIS — I10 Essential (primary) hypertension: Secondary | ICD-10-CM | POA: Insufficient documentation

## 2016-08-14 DIAGNOSIS — E559 Vitamin D deficiency, unspecified: Secondary | ICD-10-CM | POA: Diagnosis not present

## 2016-08-14 DIAGNOSIS — E039 Hypothyroidism, unspecified: Secondary | ICD-10-CM | POA: Diagnosis not present

## 2016-08-14 DIAGNOSIS — Z01812 Encounter for preprocedural laboratory examination: Secondary | ICD-10-CM | POA: Diagnosis not present

## 2016-08-14 DIAGNOSIS — T8859XA Other complications of anesthesia, initial encounter: Secondary | ICD-10-CM

## 2016-08-14 DIAGNOSIS — E785 Hyperlipidemia, unspecified: Secondary | ICD-10-CM | POA: Insufficient documentation

## 2016-08-14 DIAGNOSIS — Z01810 Encounter for preprocedural cardiovascular examination: Secondary | ICD-10-CM | POA: Insufficient documentation

## 2016-08-14 DIAGNOSIS — J984 Other disorders of lung: Secondary | ICD-10-CM | POA: Diagnosis not present

## 2016-08-14 HISTORY — DX: Other complications of anesthesia, initial encounter: T88.59XA

## 2016-08-14 HISTORY — DX: Adverse effect of unspecified anesthetic, initial encounter: T41.45XA

## 2016-08-14 HISTORY — DX: Unspecified osteoarthritis, unspecified site: M19.90

## 2016-08-14 LAB — CBC WITH DIFFERENTIAL/PLATELET
BASOS ABS: 0.1 10*3/uL (ref 0.0–0.1)
Basophils Relative: 1 %
EOS ABS: 0.1 10*3/uL (ref 0.0–0.7)
Eosinophils Relative: 1 %
HCT: 37.5 % (ref 36.0–46.0)
HEMOGLOBIN: 12.1 g/dL (ref 12.0–15.0)
LYMPHS ABS: 1.9 10*3/uL (ref 0.7–4.0)
LYMPHS PCT: 27 %
MCH: 30.6 pg (ref 26.0–34.0)
MCHC: 32.3 g/dL (ref 30.0–36.0)
MCV: 94.9 fL (ref 78.0–100.0)
Monocytes Absolute: 0.4 10*3/uL (ref 0.1–1.0)
Monocytes Relative: 6 %
NEUTROS PCT: 65 %
Neutro Abs: 4.4 10*3/uL (ref 1.7–7.7)
Platelets: 255 10*3/uL (ref 150–400)
RBC: 3.95 MIL/uL (ref 3.87–5.11)
RDW: 13.4 % (ref 11.5–15.5)
WBC: 6.8 10*3/uL (ref 4.0–10.5)

## 2016-08-14 LAB — URINALYSIS, ROUTINE W REFLEX MICROSCOPIC
BILIRUBIN URINE: NEGATIVE
Glucose, UA: NEGATIVE mg/dL
KETONES UR: 15 mg/dL — AB
Nitrite: NEGATIVE
PH: 6 (ref 5.0–8.0)
Protein, ur: NEGATIVE mg/dL
Specific Gravity, Urine: 1.02 (ref 1.005–1.030)

## 2016-08-14 LAB — URINALYSIS, MICROSCOPIC (REFLEX)

## 2016-08-14 LAB — BASIC METABOLIC PANEL
ANION GAP: 8 (ref 5–15)
BUN: 21 mg/dL — ABNORMAL HIGH (ref 6–20)
CHLORIDE: 105 mmol/L (ref 101–111)
CO2: 28 mmol/L (ref 22–32)
Calcium: 9.8 mg/dL (ref 8.9–10.3)
Creatinine, Ser: 0.87 mg/dL (ref 0.44–1.00)
GFR calc non Af Amer: 59 mL/min — ABNORMAL LOW (ref >60)
Glucose, Bld: 93 mg/dL (ref 65–99)
POTASSIUM: 4.1 mmol/L (ref 3.5–5.1)
SODIUM: 141 mmol/L (ref 135–145)

## 2016-08-14 LAB — PROTIME-INR
INR: 0.96
PROTHROMBIN TIME: 12.8 s (ref 11.4–15.2)

## 2016-08-14 LAB — SURGICAL PCR SCREEN
MRSA, PCR: NEGATIVE
STAPHYLOCOCCUS AUREUS: NEGATIVE

## 2016-08-14 LAB — TYPE AND SCREEN
ABO/RH(D): O POS
ANTIBODY SCREEN: NEGATIVE

## 2016-08-14 LAB — APTT: aPTT: 26 seconds (ref 24–36)

## 2016-08-14 MED ORDER — CHLORHEXIDINE GLUCONATE 4 % EX LIQD: 60.0000 mL | Freq: Once | CUTANEOUS | Status: DC

## 2016-08-16 DIAGNOSIS — M12552 Traumatic arthropathy, left hip: Secondary | ICD-10-CM | POA: Diagnosis present

## 2016-08-16 NOTE — H&P (Signed)
TOTAL HIP ADMISSION H&P  Patient is admitted for left total hip arthroplasty.  Subjective:  Chief Complaint: left hip pain  HPI: Amber Bryant, 83 y.o. female, has a history of pain and functional disability in the left hip(s) due to trauma and arthritis and patient has failed non-surgical conservative treatments for greater than 12 weeks to include NSAID's and/or analgesics, corticosteriod injections, flexibility and strengthening excercises, use of assistive devices, weight reduction as appropriate and activity modification.  Onset of symptoms was gradual starting several years ago with gradually worsening course since that time.The patient noted prior procedures of the hip to include intertrochanteric fracture in 2012, fixed with an intramedullary nail by Dr. Tamera Punt.   on the left hip(s).  Patient currently rates pain in the left hip at 10 out of 10 with activity. Patient has night pain, worsening of pain with activity and weight bearing, trendelenberg gait, pain that interfers with activities of daily living and pain with passive range of motion. Patient has evidence of joint space narrowing by imaging studies. This condition presents safety issues increasing the risk of falls.  There is no current active infection.  Patient Active Problem List   Diagnosis Date Noted  . Hypokalemia 04/21/2016  . Vitamin D deficiency 04/21/2016  . Hypothyroidism   . Hyperlipidemia   . Essential hypertension   . PULMONARY NODULE 11/25/2007   Past Medical History:  Diagnosis Date  . Arthritis   . Closed left femoral fracture ([place]) 2012   s/p ORIF, Dr. Tamera Punt  . Closed left radial fracture 2012   s/p reduction  . Complication of anesthesia 08/14/2016   not able to think clear for a long time afterwards  . Essential hypertension   . Hyperlipidemia   . Hypothyroidism     Past Surgical History:  Procedure Laterality Date  . ABDOMINAL HYSTERECTOMY    . CATARACT EXTRACTION W/ INTRAOCULAR LENS   IMPLANT, BILATERAL Bilateral 2014  . ORIF FEMUR FRACTURE Left 04/08/2011   Dr. Tamera Punt    No prescriptions prior to admission.   Allergies  Allergen Reactions  . Amoxicillin     Unknown Has patient had a PCN reaction causing immediate rash, facial/tongue/throat swelling, SOB or lightheadedness with hypotension: Unknown Has patient had a PCN reaction causing severe rash involving mucus membranes or skin necrosis: Unknown Has patient had a PCN reaction that required hospitalization No Has patient had a PCN reaction occurring within the last 10 years: No If all of the above answers are "NO", then may proceed with Cephalosporin use.     Social History  Substance Use Topics  . Smoking status: Never Smoker  . Smokeless tobacco: Never Used  . Alcohol use No    Family History  Problem Relation Age of Onset  . Cancer Mother   . Heart disease Father      Review of Systems  Musculoskeletal: Positive for joint pain.  All other systems reviewed and are negative.   Objective:  Physical Exam  Constitutional: She is oriented to person, place, and time. She appears well-developed and well-nourished.  HENT:  Head: Normocephalic and atraumatic.  Eyes: Pupils are equal, round, and reactive to light.  Neck: Normal range of motion. Neck supple.  Cardiovascular: Intact distal pulses.   Respiratory: Effort normal.  Musculoskeletal: She exhibits tenderness.  Patient walks with a profound left-sided limp.  Any attempts at internal or external rotation of the left hip causes severe pain.  Surgical scars where the intramedullary nail was inserted are well-healed.  Foot  tap is negative.  She is neurovascularly intact distally.  Neurological: She is alert and oriented to person, place, and time.  Skin: Skin is warm and dry.  Psychiatric: She has a normal mood and affect. Her behavior is normal. Judgment and thought content normal.    Vital signs in last 24 hours:    Labs:   Estimated body  mass index is 29.2 kg/m as calculated from the following:   Height as of 08/14/16: 5\' 5"  (1.651 m).   Weight as of 08/14/16: 79.6 kg (175 lb 8 oz).   Imaging Review Plain radiographs demonstrate AP pelvis and lateral of the left femur show well-placed well fixed intramedullary nail with one distal locking screw and end-stage arthritis of the hip joint itself bone-on-bone.  Assessment/Plan:  End-stage arthritis, left hip posttraumatic from an intertrochanteric hip fracture that occurred in 2012.  The patient history, physical examination, clinical judgement of the provider and imaging studies are consistent with end stage degenerative joint disease of the left hip(s) and total hip arthroplasty is deemed medically necessary. The treatment options including medical management, injection therapy, arthroscopy and arthroplasty were discussed at length. The risks and benefits of total hip arthroplasty were presented and reviewed. The risks due to aseptic loosening, infection, stiffness, dislocation/subluxation,  thromboembolic complications and other imponderables were discussed.  The patient acknowledged the explanation, agreed to proceed with the plan and consent was signed. Patient is being admitted for inpatient treatment for surgery, pain control, PT, OT, prophylactic antibiotics, VTE prophylaxis, progressive ambulation and ADL's and discharge planning.The patient is planning to be discharged home with home health services

## 2016-08-17 MED ORDER — VANCOMYCIN HCL IN DEXTROSE 1-5 GM/200ML-% IV SOLN
1000.0000 mg | INTRAVENOUS | Status: AC
  Administered 2016-08-20: 1000 mg via INTRAVENOUS
  Filled 2016-08-17: qty 200

## 2016-08-17 MED ORDER — BUPIVACAINE LIPOSOME 1.3 % IJ SUSP
20.0000 mL | Freq: Once | INTRAMUSCULAR | Status: AC
  Administered 2016-08-20: 20 mL
  Filled 2016-08-17: qty 20

## 2016-08-17 MED ORDER — DEXTROSE-NACL 5-0.45 % IV SOLN: INTRAVENOUS | Status: DC

## 2016-08-17 MED ORDER — TRANEXAMIC ACID 1000 MG/10ML IV SOLN
1000.0000 mg | INTRAVENOUS | Status: AC
  Administered 2016-08-20: 1000 mg via INTRAVENOUS
  Filled 2016-08-17: qty 10

## 2016-08-17 MED ORDER — TRANEXAMIC ACID 1000 MG/10ML IV SOLN
2000.0000 mg | INTRAVENOUS | Status: AC
  Administered 2016-08-20: 2000 mg via TOPICAL
  Filled 2016-08-17: qty 20

## 2016-08-19 NOTE — Anesthesia Preprocedure Evaluation (Addendum)
Anesthesia Evaluation    Airway Mallampati: II  TM Distance: >3 FB Neck ROM: Full    Dental no notable dental hx.    Pulmonary    Pulmonary exam normal breath sounds clear to auscultation       Cardiovascular hypertension, Normal cardiovascular exam Rhythm:Regular Rate:Normal     Neuro/Psych    GI/Hepatic   Endo/Other    Renal/GU      Musculoskeletal   Abdominal   Peds  Hematology   Anesthesia Other Findings   Reproductive/Obstetrics                            Anesthesia Physical Anesthesia Plan  ASA: II  Anesthesia Plan: Spinal   Post-op Pain Management:    Induction:   Airway Management Planned:   Additional Equipment:   Intra-op Plan:   Post-operative Plan:   Informed Consent: I have reviewed the patients History and Physical, chart, labs and discussed the procedure including the risks, benefits and alternatives for the proposed anesthesia with the patient or authorized representative who has indicated his/her understanding and acceptance.   Dental advisory given  Plan Discussed with: CRNA  Anesthesia Plan Comments:         Anesthesia Quick Evaluation                                   Anesthesia Evaluation    Airway        Dental   Pulmonary           Cardiovascular      Neuro/Psych    GI/Hepatic   Endo/Other    Renal/GU      Musculoskeletal   Abdominal   Peds  Hematology   Anesthesia Other Findings   Reproductive/Obstetrics                             Anesthesia Physical Anesthesia Plan  ASA: II  Anesthesia Plan: Spinal   Post-op Pain Management:    Induction:   Airway Management Planned:   Additional Equipment:   Intra-op Plan:   Post-operative Plan:   Informed Consent: I have reviewed the patients History and Physical, chart, labs and discussed the procedure including the risks,  benefits and alternatives for the proposed anesthesia with the patient or authorized representative who has indicated his/her understanding and acceptance.   Dental advisory given  Plan Discussed with: CRNA  Anesthesia Plan Comments:         Anesthesia Quick Evaluation

## 2016-08-20 ENCOUNTER — Inpatient Hospital Stay (HOSPITAL_COMMUNITY): Payer: Medicare Other | Admitting: Anesthesiology

## 2016-08-20 ENCOUNTER — Inpatient Hospital Stay (HOSPITAL_COMMUNITY)
Admission: RE | Admit: 2016-08-20 | Discharge: 2016-08-22 | DRG: 470 | Disposition: A | Discharge status: Skilled Nursing Facility | Payer: Medicare Other | Source: Ambulatory Visit | Attending: Orthopedic Surgery | Admitting: Orthopedic Surgery

## 2016-08-20 ENCOUNTER — Encounter (HOSPITAL_COMMUNITY): Payer: Self-pay | Admitting: Urology

## 2016-08-20 ENCOUNTER — Inpatient Hospital Stay (HOSPITAL_COMMUNITY): Payer: Medicare Other

## 2016-08-20 ENCOUNTER — Encounter (HOSPITAL_COMMUNITY)
Admission: RE | Disposition: A | Discharge status: Skilled Nursing Facility | Payer: Self-pay | Source: Ambulatory Visit | Attending: Orthopedic Surgery

## 2016-08-20 DIAGNOSIS — D62 Acute posthemorrhagic anemia: Secondary | ICD-10-CM | POA: Diagnosis not present

## 2016-08-20 DIAGNOSIS — E039 Hypothyroidism, unspecified: Secondary | ICD-10-CM | POA: Diagnosis present

## 2016-08-20 DIAGNOSIS — E785 Hyperlipidemia, unspecified: Secondary | ICD-10-CM | POA: Diagnosis present

## 2016-08-20 DIAGNOSIS — S72142S Displaced intertrochanteric fracture of left femur, sequela: Secondary | ICD-10-CM

## 2016-08-20 DIAGNOSIS — I10 Essential (primary) hypertension: Secondary | ICD-10-CM | POA: Diagnosis present

## 2016-08-20 DIAGNOSIS — M1611 Unilateral primary osteoarthritis, right hip: Secondary | ICD-10-CM | POA: Diagnosis present

## 2016-08-20 DIAGNOSIS — M12552 Traumatic arthropathy, left hip: Principal | ICD-10-CM | POA: Diagnosis present

## 2016-08-20 DIAGNOSIS — Z96642 Presence of left artificial hip joint: Secondary | ICD-10-CM | POA: Diagnosis not present

## 2016-08-20 DIAGNOSIS — M1612 Unilateral primary osteoarthritis, left hip: Secondary | ICD-10-CM | POA: Diagnosis present

## 2016-08-20 DIAGNOSIS — Z96649 Presence of unspecified artificial hip joint: Secondary | ICD-10-CM

## 2016-08-20 DIAGNOSIS — X58XXXS Exposure to other specified factors, sequela: Secondary | ICD-10-CM | POA: Diagnosis present

## 2016-08-20 HISTORY — PX: TOTAL HIP ARTHROPLASTY: SHX124

## 2016-08-20 SURGERY — ARTHROPLASTY, HIP, TOTAL,POSTERIOR APPROACH
Anesthesia: Spinal | Laterality: Left

## 2016-08-20 MED ORDER — HYDROMORPHONE HCL 2 MG/ML IJ SOLN: 0.5000 mg | INTRAMUSCULAR | Status: DC | PRN

## 2016-08-20 MED ORDER — ACETAMINOPHEN 325 MG PO TABS
650.0000 mg | ORAL_TABLET | Freq: Four times a day (QID) | ORAL | Status: DC | PRN
  Filled 2016-08-20: qty 2

## 2016-08-20 MED ORDER — BUPIVACAINE HCL (PF) 0.5 % IJ SOLN
INTRAMUSCULAR | Status: DC | PRN
  Administered 2016-08-20: 3 mL via INTRATHECAL

## 2016-08-20 MED ORDER — PROPOFOL 10 MG/ML IV BOLUS
INTRAVENOUS | Status: AC
  Filled 2016-08-20: qty 20

## 2016-08-20 MED ORDER — LEVOTHYROXINE SODIUM 25 MCG PO TABS
25.0000 ug | ORAL_TABLET | Freq: Every day | ORAL | Status: DC
  Administered 2016-08-21 – 2016-08-22 (×2): 25 ug via ORAL
  Filled 2016-08-20 (×2): qty 1

## 2016-08-20 MED ORDER — SODIUM CHLORIDE 0.9 % IR SOLN
Status: DC | PRN
Start: 2016-08-20 — End: 2016-08-20
  Administered 2016-08-20: 1000 mL

## 2016-08-20 MED ORDER — FENTANYL CITRATE (PF) 100 MCG/2ML IJ SOLN
INTRAMUSCULAR | Status: AC
  Filled 2016-08-20: qty 2

## 2016-08-20 MED ORDER — METOCLOPRAMIDE HCL 5 MG PO TABS: 5.0000 mg | ORAL_TABLET | Freq: Three times a day (TID) | ORAL | Status: DC | PRN

## 2016-08-20 MED ORDER — FENTANYL CITRATE (PF) 100 MCG/2ML IJ SOLN
INTRAMUSCULAR | Status: DC | PRN
  Administered 2016-08-20: 50 ug via INTRAVENOUS

## 2016-08-20 MED ORDER — FLEET ENEMA 7-19 GM/118ML RE ENEM: 1.0000 | ENEMA | Freq: Once | RECTAL | Status: DC | PRN

## 2016-08-20 MED ORDER — LACTATED RINGERS IV SOLN
INTRAVENOUS | Status: DC
  Administered 2016-08-20 (×2): via INTRAVENOUS

## 2016-08-20 MED ORDER — ONDANSETRON HCL 4 MG/2ML IJ SOLN
4.0000 mg | Freq: Four times a day (QID) | INTRAMUSCULAR | Status: DC | PRN
  Administered 2016-08-20: 4 mg via INTRAVENOUS
  Filled 2016-08-20: qty 2

## 2016-08-20 MED ORDER — FENTANYL CITRATE (PF) 100 MCG/2ML IJ SOLN: 25.0000 ug | INTRAMUSCULAR | Status: DC | PRN

## 2016-08-20 MED ORDER — PROMETHAZINE HCL 25 MG/ML IJ SOLN
INTRAMUSCULAR | Status: AC
  Filled 2016-08-20: qty 1

## 2016-08-20 MED ORDER — PHENOL 1.4 % MT LIQD: 1.0000 | OROMUCOSAL | Status: DC | PRN

## 2016-08-20 MED ORDER — DOCUSATE SODIUM 100 MG PO CAPS
100.0000 mg | ORAL_CAPSULE | Freq: Two times a day (BID) | ORAL | Status: DC
  Administered 2016-08-21 – 2016-08-22 (×2): 100 mg via ORAL
  Filled 2016-08-20 (×3): qty 1

## 2016-08-20 MED ORDER — ONDANSETRON HCL 4 MG PO TABS: 4.0000 mg | ORAL_TABLET | Freq: Four times a day (QID) | ORAL | Status: DC | PRN

## 2016-08-20 MED ORDER — ACETAMINOPHEN 650 MG RE SUPP: 650.0000 mg | Freq: Four times a day (QID) | RECTAL | Status: DC | PRN

## 2016-08-20 MED ORDER — METHOCARBAMOL 1000 MG/10ML IJ SOLN: 500.0000 mg | Freq: Four times a day (QID) | INTRAVENOUS | Status: DC | PRN

## 2016-08-20 MED ORDER — PRAVASTATIN SODIUM 20 MG PO TABS
20.0000 mg | ORAL_TABLET | Freq: Every day | ORAL | Status: DC
  Administered 2016-08-20: 20 mg via ORAL
  Filled 2016-08-20 (×2): qty 1

## 2016-08-20 MED ORDER — PROPOFOL 10 MG/ML IV BOLUS
INTRAVENOUS | Status: DC | PRN
  Administered 2016-08-20: 30 mg via INTRAVENOUS

## 2016-08-20 MED ORDER — PHENYLEPHRINE HCL 10 MG/ML IJ SOLN
INTRAVENOUS | Status: DC | PRN
  Administered 2016-08-20: 25 ug/min via INTRAVENOUS

## 2016-08-20 MED ORDER — PROMETHAZINE HCL 25 MG/ML IJ SOLN
6.2500 mg | INTRAMUSCULAR | Status: DC | PRN
  Administered 2016-08-20: 6.25 mg via INTRAVENOUS

## 2016-08-20 MED ORDER — OXYCODONE-ACETAMINOPHEN 5-325 MG PO TABS: 1.0000 | ORAL_TABLET | ORAL | 0 refills | Status: DC | PRN

## 2016-08-20 MED ORDER — PHENYLEPHRINE HCL 10 MG/ML IJ SOLN
INTRAMUSCULAR | Status: DC | PRN
  Administered 2016-08-20: 40 ug via INTRAVENOUS
  Administered 2016-08-20: 30 ug via INTRAVENOUS
  Administered 2016-08-20: 80 ug via INTRAVENOUS

## 2016-08-20 MED ORDER — SODIUM CHLORIDE 0.9 % IJ SOLN
INTRAMUSCULAR | Status: DC | PRN
  Administered 2016-08-20: 20 mL

## 2016-08-20 MED ORDER — DIPHENHYDRAMINE HCL 12.5 MG/5ML PO ELIX
12.5000 mg | ORAL_SOLUTION | ORAL | Status: DC | PRN
  Administered 2016-08-21: 12.5 mg via ORAL
  Filled 2016-08-20: qty 10

## 2016-08-20 MED ORDER — SENNOSIDES-DOCUSATE SODIUM 8.6-50 MG PO TABS: 1.0000 | ORAL_TABLET | Freq: Every evening | ORAL | Status: DC | PRN

## 2016-08-20 MED ORDER — PROPOFOL 500 MG/50ML IV EMUL
INTRAVENOUS | Status: DC | PRN
  Administered 2016-08-20: 75 ug/kg/min via INTRAVENOUS

## 2016-08-20 MED ORDER — TRIAMTERENE-HCTZ 37.5-25 MG PO TABS
1.0000 | ORAL_TABLET | Freq: Every day | ORAL | Status: DC
  Administered 2016-08-21 – 2016-08-22 (×2): 1 via ORAL
  Filled 2016-08-20 (×2): qty 1

## 2016-08-20 MED ORDER — DEXAMETHASONE SODIUM PHOSPHATE 10 MG/ML IJ SOLN
10.0000 mg | Freq: Once | INTRAMUSCULAR | Status: AC
  Administered 2016-08-21: 10 mg via INTRAVENOUS
  Filled 2016-08-20: qty 1

## 2016-08-20 MED ORDER — TIZANIDINE HCL 2 MG PO TABS: 2.0000 mg | ORAL_TABLET | Freq: Four times a day (QID) | ORAL | 0 refills | Status: DC | PRN

## 2016-08-20 MED ORDER — MENTHOL 3 MG MT LOZG: 1.0000 | LOZENGE | OROMUCOSAL | Status: DC | PRN

## 2016-08-20 MED ORDER — VANCOMYCIN HCL 1000 MG IV SOLR
INTRAVENOUS | Status: DC | PRN
  Administered 2016-08-20: 1000 mg via INTRAVENOUS

## 2016-08-20 MED ORDER — METOCLOPRAMIDE HCL 5 MG/ML IJ SOLN: 5.0000 mg | Freq: Three times a day (TID) | INTRAMUSCULAR | Status: DC | PRN

## 2016-08-20 MED ORDER — KCL IN DEXTROSE-NACL 20-5-0.45 MEQ/L-%-% IV SOLN
INTRAVENOUS | Status: DC
  Administered 2016-08-20: 16:00:00 via INTRAVENOUS
  Filled 2016-08-20: qty 1000

## 2016-08-20 MED ORDER — ALUM & MAG HYDROXIDE-SIMETH 200-200-20 MG/5ML PO SUSP: 30.0000 mL | ORAL | Status: DC | PRN

## 2016-08-20 MED ORDER — ASPIRIN EC 325 MG PO TBEC: 325.0000 mg | DELAYED_RELEASE_TABLET | Freq: Two times a day (BID) | ORAL | 0 refills | Status: DC

## 2016-08-20 MED ORDER — BUPIVACAINE HCL (PF) 0.25 % IJ SOLN
INTRAMUSCULAR | Status: DC | PRN
  Administered 2016-08-20: 30 mL

## 2016-08-20 MED ORDER — MEPERIDINE HCL 25 MG/ML IJ SOLN: 6.2500 mg | INTRAMUSCULAR | Status: DC | PRN

## 2016-08-20 MED ORDER — ASPIRIN EC 325 MG PO TBEC
325.0000 mg | DELAYED_RELEASE_TABLET | Freq: Every day | ORAL | Status: DC
  Administered 2016-08-21 – 2016-08-22 (×2): 325 mg via ORAL
  Filled 2016-08-20 (×2): qty 1

## 2016-08-20 MED ORDER — BUPIVACAINE HCL (PF) 0.25 % IJ SOLN
INTRAMUSCULAR | Status: AC
  Filled 2016-08-20: qty 30

## 2016-08-20 MED ORDER — METHOCARBAMOL 500 MG PO TABS
500.0000 mg | ORAL_TABLET | Freq: Four times a day (QID) | ORAL | Status: DC | PRN
Start: 2016-08-20 — End: 2016-08-22
  Administered 2016-08-20 – 2016-08-21 (×3): 500 mg via ORAL
  Filled 2016-08-20 (×3): qty 1

## 2016-08-20 MED ORDER — OXYCODONE HCL 5 MG PO TABS
5.0000 mg | ORAL_TABLET | ORAL | Status: DC | PRN
  Administered 2016-08-20 – 2016-08-21 (×3): 5 mg via ORAL
  Administered 2016-08-22 (×2): 10 mg via ORAL
  Filled 2016-08-20: qty 2
  Filled 2016-08-20: qty 1
  Filled 2016-08-20 (×2): qty 2
  Filled 2016-08-20: qty 1

## 2016-08-20 MED ORDER — BISACODYL 5 MG PO TBEC: 5.0000 mg | DELAYED_RELEASE_TABLET | Freq: Every day | ORAL | Status: DC | PRN

## 2016-08-20 SURGICAL SUPPLY — 53 items
BLADE SAW SGTL 18X1.27X75 (BLADE) ×2 IMPLANT
BRUSH FEMORAL CANAL (MISCELLANEOUS) IMPLANT
CAPT HIP TOTAL 2 ×2 IMPLANT
COVER SURGICAL LIGHT HANDLE (MISCELLANEOUS) ×2 IMPLANT
DRAPE ORTHO SPLIT 77X108 STRL (DRAPES) ×1
DRAPE PROXIMA HALF (DRAPES) ×2 IMPLANT
DRAPE SURG ORHT 6 SPLT 77X108 (DRAPES) ×1 IMPLANT
DRAPE U-SHAPE 47X51 STRL (DRAPES) ×2 IMPLANT
DRILL BIT 7/64X5 (BIT) ×2 IMPLANT
DRSG AQUACEL AG ADV 3.5X10 (GAUZE/BANDAGES/DRESSINGS) IMPLANT
DRSG AQUACEL AG ADV 3.5X14 (GAUZE/BANDAGES/DRESSINGS) IMPLANT
DURAPREP 26ML APPLICATOR (WOUND CARE) ×2 IMPLANT
ELECT BLADE 4.0 EZ CLEAN MEGAD (MISCELLANEOUS) ×2
ELECT REM PT RETURN 9FT ADLT (ELECTROSURGICAL) ×2
ELECTRODE BLDE 4.0 EZ CLN MEGD (MISCELLANEOUS) ×1 IMPLANT
ELECTRODE REM PT RTRN 9FT ADLT (ELECTROSURGICAL) ×1 IMPLANT
GLOVE BIO SURGEON STRL SZ7.5 (GLOVE) ×2 IMPLANT
GLOVE BIO SURGEON STRL SZ8.5 (GLOVE) ×4 IMPLANT
GLOVE BIOGEL PI IND STRL 8 (GLOVE) ×2 IMPLANT
GLOVE BIOGEL PI IND STRL 9 (GLOVE) ×1 IMPLANT
GLOVE BIOGEL PI INDICATOR 8 (GLOVE) ×2
GLOVE BIOGEL PI INDICATOR 9 (GLOVE) ×1
GOWN STRL REUS W/ TWL LRG LVL3 (GOWN DISPOSABLE) ×2 IMPLANT
GOWN STRL REUS W/ TWL XL LVL3 (GOWN DISPOSABLE) ×2 IMPLANT
GOWN STRL REUS W/TWL LRG LVL3 (GOWN DISPOSABLE) ×2
GOWN STRL REUS W/TWL XL LVL3 (GOWN DISPOSABLE) ×2
HANDPIECE INTERPULSE COAX TIP (DISPOSABLE)
HOOD PEEL AWAY FACE SHEILD DIS (HOOD) ×4 IMPLANT
KIT BASIN OR (CUSTOM PROCEDURE TRAY) ×2 IMPLANT
KIT ROOM TURNOVER OR (KITS) ×2 IMPLANT
MANIFOLD NEPTUNE II (INSTRUMENTS) ×2 IMPLANT
NEEDLE 22X1 1/2 (OR ONLY) (NEEDLE) ×2 IMPLANT
NS IRRIG 1000ML POUR BTL (IV SOLUTION) ×2 IMPLANT
PACK TOTAL JOINT (CUSTOM PROCEDURE TRAY) ×2 IMPLANT
PAD ARMBOARD 7.5X6 YLW CONV (MISCELLANEOUS) ×4 IMPLANT
PASSER SUT SWANSON 36MM LOOP (INSTRUMENTS) ×2 IMPLANT
PRESSURIZER FEMORAL UNIV (MISCELLANEOUS) IMPLANT
SET HNDPC FAN SPRY TIP SCT (DISPOSABLE) IMPLANT
SUT ETHIBOND 2 V 37 (SUTURE) ×2 IMPLANT
SUT VIC AB 0 CTX 36 (SUTURE) ×1
SUT VIC AB 0 CTX36XBRD ANTBCTR (SUTURE) ×1 IMPLANT
SUT VIC AB 1 CTX 36 (SUTURE) ×1
SUT VIC AB 1 CTX36XBRD ANBCTR (SUTURE) ×1 IMPLANT
SUT VIC AB 2-0 CTX 27 (SUTURE) ×2 IMPLANT
SUT VIC AB 3-0 CT1 27 (SUTURE) ×1
SUT VIC AB 3-0 CT1 TAPERPNT 27 (SUTURE) ×1 IMPLANT
SYR CONTROL 10ML LL (SYRINGE) ×2 IMPLANT
TOWEL OR 17X24 6PK STRL BLUE (TOWEL DISPOSABLE) IMPLANT
TOWEL OR 17X26 10 PK STRL BLUE (TOWEL DISPOSABLE) IMPLANT
TOWER CARTRIDGE SMART MIX (DISPOSABLE) IMPLANT
TRAY CATH 16FR W/PLASTIC CATH (SET/KITS/TRAYS/PACK) IMPLANT
TRAY FOLEY CATH SILVER 14FR (SET/KITS/TRAYS/PACK) ×2 IMPLANT
WATER STERILE IRR 1000ML POUR (IV SOLUTION) IMPLANT

## 2016-08-20 NOTE — Op Note (Signed)
PATIENT ID:      Amber Bryant  MRN:     [id number] DOB/AGE:    11-25-1928 / 83 y.o.       OPERATIVE REPORT    DATE OF PROCEDURE:  08/20/2016       PREOPERATIVE DIAGNOSIS:  LEFT HIP DEGENERATIVE JOINT DISEASE RETAINED TROCH NAIL [employer] 11 mm x 36 mm single distal locking screw                                                      Estimated body mass index is 29.2 kg/m as calculated from the following:   Height as of 08/14/16: 5' 5"  (1.651 m).   Weight as of 08/14/16: 175 lb 8 oz (79.6 kg).     POSTOPERATIVE DIAGNOSIS:  LEFT HIP DEGENERATIVE JOINT DISEASE RETAINED                                                            PROCEDURE:  Removal of left hip trochanteric nail, left total hip arthroplasty using a 52 mm DePuy Pinnacle  Cup, [employer], 10-degree polyethylene liner index superior  and posterior, a +3 36 mm ceramic head, a 20x15x50x36 SROM Stem, 20Dl Sleeve  SURGEON: Arlethia Basso J    ASSISTANT:   Eric K[employer]  (present throughout entire procedure and necessary for timely completion of the procedure)  ANESTHESIA: Spinal  BLOOD LOSS: 400 FLUID REPLACEMENT: 1600 crystalloid Tranexamic Acid: 1gm iv, 2gm topical DRAINS: None COMPLICATIONS: None    INDICATIONS FOR PROCEDURE:Patient with end-stage arthritis of the L hip, 6 years after placement of a trochanteric nail by one of my partners..  X-rays show bone-on-bone arthritic changes, peri chondral cyts. Despite conservative measures with observation, anti-inflammatory medicine, narcotics, use of a cane, has severe unremitting pain and can ambulate only 1 blocks before resting.  Patient desires elective removal of the trochanteric nail followed by left total hip arthroplasty to decrease pain and increase function. The risks, benefits, and alternatives were discussed at length including but not limited to the risks of infection, bleeding, nerve injury, stiffness, blood clots, the need for revision surgery,  cardiopulmonary complications, among others, and they were willing to proceed.Benefits have been discussed. Questions answered.     PROCEDURE IN DETAIL: The patient was identified by armband,  received preoperative IV antibiotics in the holding area at [facility], taken to the operating room , appropriate anesthetic monitors  were attached and general endotracheal anesthesia induced. Foley catheter was inserted. Patient was rolled into the R lateral decubitus position and fixed there with a Stulberg Mark II pelvic clamp and the L lower extremity was then prepped and draped  in the usual sterile fashion from the ankle to the hemipelvis. A time-out  procedure was performed. The skin along the lateral hip and thigh  infiltrated with 10 mL of 0.5% Marcaine and epinephrine solution. Utilizing the old wound on the skin from the distal locking screw a 1 cm incision was made allowing introduction of a screwdriver and removal of the distal locking screw. This small stab wound was closed with running 3-0 Vicryl suture. We then made  a posterolateral approach to the hip. With a #10 blade, 18 cm  incision through skin and subcutaneous tissue down to the level of the  IT band. Small bleeders were identified and cauterized. IT band cut in  line with skin incision exposing the greater trochanter. He identified the insertion point of the nail and loosened the lag screw locking bolt. The cross lag screw was palpated through the proximal vastus lateralis and again a small incision was made over the lag screw had allowing [place] to capture it with the lag screw extractor. The lag screw was then removed. A slap hammer was applied to the proximal nail and the nail removed without much difficulty. A Cobra retractor was placed between the gluteus minimus and the superior hip joint capsule, and a spiked Cobra between the quadratus femoris and the inferior hip joint capsule. This isolated the short  external rotators and  piriformis tendons. These were tagged with a #2 Ethibond  suture and cut off their insertion on the intertrochanteric crest. The posterior  capsule was then developed into an acetabular-based flap from Posterior Superior off of the acetabulum out over the femoral neck and back posterior inferior to the acetabular rim. This flap was tagged with two #2 Ethibond sutures and retracted protecting the sciatic nerve. This exposed the arthritic femoral head and osteophytes. The hip was then flexed and internally rotated, dislocating the femoral head and a standard neck cut performed 1 fingerbreadth above the lesser trochanter.  A spiked Cobra was placed in the cotyloid notch and a Hohmann retractor was then used to lever the femur anteriorly off of the anterior pelvic column. A posterior-inferior wing retractor was placed at the junction of the acetabulum and the ischium completing the acetabular exposure.We then removed the peripheral osteophytes and labrum from the acetabulum. We then reamed the acetabulum up to 51 mm with basket reamers obtaining good coverage in all quadrants, irrigated out with normal  saline solution and hammered into place a 52 mm pinnacle cup in 45  degrees of abduction and about 20 degrees of anteversion. More  peripheral osteophytes removed, the apex hole eliminator was placed, and a 10-degree liner placed with the  IndexPS. The hip was then flexed and internally rotated exposing the  proximal femur, which was entered with the box cutting chisel, the initiating reamer followed by axial reaming up to 15.5 mm full depth, and 16 partial depth. We then conically reamed up to 20D. And milled the calcar to 20DL. We then placed the trial sleeve, and a trial stem. A trial reduction was then  performed with a +0  36-mm ball on the standard neck and  excellent stability was noted with at 90 of flexion with 75 of  internal rotation and then full extension withexternal rotation. The hip  could not  be dislocated in full extension. The knee could easily flex  to about 140 degrees. We also stretched the abductors at this point,  because of the preexisting adductor contractures. All trial components  were then removed. The real sleeve was then hammered into place, through the sleeve we reamed with a 15.5 reamer to ensure that the stem did not become incarcerated. The stem itself was then inserted in 15 anteversion in relation to the calcar.  At this point, a + 0 36-mm ceramic head was  hammered on the stem. The hip was reduced. We checked our stability  one more time and found to be excellent. The wound was once again  thoroughly  irrigated out with normal saline solution pulse lavage. The  capsular flap and short external rotators were repaired back to the  intertrochanteric crest through drill holes with a #2 Ethibond suture.  The IT band was closed with running 1 Vicryl suture. The subcutaneous  tissue with 0 and 2-0 undyed Vicryl suture and the skin with running  3-0 Vivryl SQ suture. Aquacil dessing was applied. The patient was then unclamped, rolled supine, awaken extubated and taken to recovery room without difficulty in stable condition.   Dayne Dekay J 08/20/2016, 11:26 AM

## 2016-08-20 NOTE — Discharge Instructions (Signed)

## 2016-08-20 NOTE — Transfer of Care (Signed)
Immediate Anesthesia Transfer of Care Note  Patient: Amber Bryant  Procedure(s) Performed: Procedure(s): TOTAL HIP ARTHROPLASTY POSTERIOR REMOVE [place] NAIL (Left)  Patient Location: PACU  Anesthesia Type:MAC and Spinal  Level of Consciousness: awake, alert , oriented and patient cooperative  Airway & Oxygen Therapy: Patient Spontanous Breathing and Patient connected to face mask oxygen  Post-op Assessment: Report given to RN and Post -op Vital signs reviewed and stable  Post vital signs: Reviewed and stable  Last Vitals:  Vitals:   08/20/16 0712 08/20/16 0719  BP:  139/64  Pulse:    Resp:    Temp: 36.7 C     Last Pain:  Vitals:   08/20/16 0711  TempSrc: Oral         Complications: No apparent anesthesia complications

## 2016-08-20 NOTE — Anesthesia Procedure Notes (Signed)
Spinal  Patient location during procedure: OR Staffing Anesthesiologist: Nolon Nations Performed: anesthesiologist  Preanesthetic Checklist Completed: patient identified, site marked, surgical consent, pre-op evaluation, timeout performed, IV checked, risks and benefits discussed and monitors and equipment checked Spinal Block Patient position: sitting Prep: ChloraPrep Patient monitoring: heart rate, continuous pulse ox and blood pressure Approach: right paramedian Location: L2-3 Injection technique: single-shot Needle Needle type: Sprotte  Needle gauge: 24 G Needle length: 9 cm Assessment Sensory level: T10 Additional Notes Expiration date of kit checked and confirmed. Patient tolerated procedure well, without complications.

## 2016-08-20 NOTE — Anesthesia Postprocedure Evaluation (Signed)
Anesthesia Post Note  Patient: Amber Bryant  Procedure(s) Performed: Procedure(s) (LRB): TOTAL HIP ARTHROPLASTY POSTERIOR REMOVE [place] NAIL (Left)  Patient location during evaluation: PACU Anesthesia Type: Spinal Level of consciousness: awake and alert Pain management: pain level controlled Vital Signs Assessment: post-procedure vital signs reviewed and stable Respiratory status: spontaneous breathing and respiratory function stable Cardiovascular status: blood pressure returned to baseline and stable Postop Assessment: spinal receding Anesthetic complications: no       Last Vitals:  Vitals:   08/20/16 1225 08/20/16 1230  BP: 103/60   Pulse: (!) 58 (!) 58  Resp: 17 11  Temp:      Last Pain:  Vitals:   08/20/16 1225  TempSrc:   PainSc: 0-No pain    LLE Motor Response: No movement due to regional block (08/20/16 1225) LLE Sensation: Decreased (08/20/16 1225)     L Sensory Level: T12-Inguinal (groin) region (08/20/16 1225) R Sensory Level: T12-Inguinal (groin) region (08/20/16 1225)  Nolon Nations

## 2016-08-20 NOTE — Evaluation (Signed)
Physical Therapy Evaluation Patient Details Name: Amber Bryant MRN: [id number] DOB: [birth year] Today's Date: 08/20/2016   History of Present Illness  83 y.o. female admitted to [facility] on 08/20/16 for elective L THA posterior. Pt with significant PMhx of essential HTN, L radial fx, and L femoral fx s/p ORIF (2012).   Clinical Impression  Pt was limited by nausea and lethargy this evening, but was able to transfer to [facility] and to recliner chair with one person assist. She is appropriate and interested in SNF level rehab at discharge and is requesting [facility].   PT to follow acutely for deficits listed below.       Follow Up Recommendations SNF (wants [facility])    Equipment Recommendations  None recommended by PT    Recommendations for Other Services   NA    Precautions / Restrictions Precautions Precautions: Posterior Hip;Fall Precaution Booklet Issued: Yes (comment) Precaution Comments: handout given, but I don't think pt will remember me verbally reviewing the precautions with her this evening.  Restrictions Weight Bearing Restrictions: Yes LLE Weight Bearing: Weight bearing as tolerated      Mobility  Bed Mobility Overal bed mobility: Needs Assistance Bed Mobility: Supine to Sit     Supine to sit: Mod assist;HOB elevated     General bed mobility comments: mod assist to support trunk to get to sitting EOB.  Verbal cues for hand placement pt leaning posteriorly,   Transfers Overall transfer level: Needs assistance Equipment used: Rolling walker (2 wheeled) Transfers: Sit to/from [employer] Transfers Sit to Stand: From elevated surface;Mod assist Stand pivot transfers: From elevated surface;Mod assist       General transfer comment: Mod assist to support trunk for balance and to help pt stand and pivot to the [facility] and then to the recliner chair.  Pt unable to stand from lower surface.  Antalgic steps around with support at trunk and verbal cues and manual assist  for safe RW use.   Ambulation/Gait             General Gait Details: unable at this time due to lethargy and nausea.       Balance Overall balance assessment: Needs assistance Sitting-balance support: Feet supported;Bilateral upper extremity supported Sitting balance-Leahy Scale: Poor Sitting balance - Comments: min assist EOB to prevent posterior LOB Postural control: Posterior lean Standing balance support: Bilateral upper extremity supported Standing balance-Leahy Scale: Poor Standing balance comment: mod assist at trunk in standing with support of RW                             Pertinent Vitals/Pain Pain Assessment: Faces Faces Pain Scale: Hurts even more Pain Location: left leg Pain Descriptors / Indicators: Grimacing;Guarding Pain Intervention(s): Limited activity within patient's tolerance;Monitored during session;Repositioned    Home Living Family/patient expects to be discharged to:: Skilled nursing facility Environmental education officer)                      Prior Function           Comments: unsure, difficult to get hx from pt due to lethargy     Extremity/TrunkAssessment   Upper Extremity Assessment Upper Extremity Assessment: Defer to OT evaluation    Lower Extremity Assessment Lower Extremity Assessment: LLE deficits/detail LLE Deficits / Details: left leg with normal post op pain and weakness, ankle at least 3/5, knee 3-/5, hip 2/5 LLE: Unable to fully assess due to pain LLE Sensation:  (  intact to LT)    Cervical / Trunk Assessment Cervical / Trunk Assessment: Normal  Communication   Communication: No difficulties  Cognition Arousal/Alertness: Lethargic;Suspect due to medications Behavior During Therapy: [facility] for tasks assessed/performed Overall Cognitive Status: Within Functional Limits for tasks assessed                         Exercises Total Joint Exercises Ankle Circles/Pumps: AROM;Both;20 reps Heel Slides:  AAROM;Left;5 reps   Assessment/Plan    PT Assessment Patient needs continued PT services  PT Problem List Decreased strength;Decreased range of motion;Decreased activity tolerance;Decreased balance;Decreased mobility;Decreased knowledge of use of DME;Pain;Decreased knowledge of precautions       PT Treatment Interventions DME instruction;Gait training;Functional mobility training;Therapeutic activities;Therapeutic exercise;Balance training;Patient/family education;Manual techniques;Modalities    PT Goals (Current goals can be found in the Care Plan section)  Acute Rehab PT Goals Patient Stated Goal: to go to [facility] for rehab PT Goal Formulation: With patient Time For Goal Achievement: 08/27/16 Potential to Achieve Goals: Good    Frequency 7X/week           End of Session Equipment Utilized During Treatment: Gait belt Activity Tolerance: Patient limited by pain Patient left: in chair;with call bell/phone within reach Nurse Communication: Mobility status PT Visit Diagnosis: Unsteadiness on feet (R26.81);Muscle weakness (generalized) (M62.81);Difficulty in walking, not elsewhere classified (R26.2);Pain Pain - Right/Left: Left Pain - part of body: Hip         Time: [id number] PT Time Calculation (min) (ACUTE ONLY): 30 min   Charges:   PT Evaluation $PT Eval Moderate Complexity: 1 Procedure PT Treatments $Therapeutic Activity: 8-22 mins         Josi Roediger B. Odetta Forness, PT, DPT [phone]   08/20/2016, 5:46 PM

## 2016-08-21 ENCOUNTER — Encounter (HOSPITAL_COMMUNITY): Payer: Self-pay | Admitting: Orthopedic Surgery

## 2016-08-21 LAB — CBC
HCT: 29.4 % — ABNORMAL LOW (ref 36.0–46.0)
Hemoglobin: 9.4 g/dL — ABNORMAL LOW (ref 12.0–15.0)
MCH: 30.2 pg (ref 26.0–34.0)
MCHC: 32 g/dL (ref 30.0–36.0)
MCV: 94.5 fL (ref 78.0–100.0)
PLATELETS: 190 10*3/uL (ref 150–400)
RBC: 3.11 MIL/uL — ABNORMAL LOW (ref 3.87–5.11)
RDW: 13.6 % (ref 11.5–15.5)
WBC: 8.4 10*3/uL (ref 4.0–10.5)

## 2016-08-21 NOTE — Evaluation (Signed)
Occupational Therapy Evaluation Patient Details Name: Tauheedah Pacyna MRN: [id number] DOB: [birth year] Today's Date: 08/21/2016    History of Present Illness 83 y.o. female admitted to [facility] on 08/20/16 for elective L THA posterior. Pt with significant PMhx of essential HTN, L radial fx, and L femoral fx s/p ORIF (2012).    Clinical Impression   Patient is s/p L THA surgery resulting in functional limitations due to the deficits listed below (see OT problem list). OT  to defer further OT to [facility].   Patient will benefit from skilled OT acutely to increase independence and safety with ADLS to allow discharge SNF. Pt agreeable to SNF .     Follow Up Recommendations  SNF    Equipment Recommendations  3 in 1 bedside commode (RW)    Recommendations for Other Services       Precautions / Restrictions Precautions Precautions: Posterior Hip;Fall Precaution Comments: no recall of precautions Restrictions Weight Bearing Restrictions: Yes LLE Weight Bearing: Weight bearing as tolerated      Mobility Bed Mobility Overal bed mobility: Needs Assistance Bed Mobility: Supine to Sit     Supine to sit: Mod assist;HOB elevated     General bed mobility comments: required (A) for L LE to scoot to eob. pt needed max cues to sequence task. pt required use of pad to help pivot hips  Transfers Overall transfer level: Needs assistance Equipment used: Rolling walker (2 wheeled) Transfers: Sit to/from Stand Sit to Stand: From elevated surface;Mod assist         General transfer comment: pt needed education on hand placment on RW and (A) to power up into standing    Balance Overall balance assessment: Needs assistance Sitting-balance support: Feet supported;Bilateral upper extremity supported Sitting balance-Leahy Scale: Poor Sitting balance - Comments: posterior lean Postural control: Posterior lean                                  ADL Overall ADL's : Needs  assistance/impaired Eating/Feeding: Set up   Grooming: Wash/dry face;Wash/dry hands;Set up       Lower Body Bathing: Total assistance           Toilet Transfer: Maximal assistance;Adhering to hip precautions;Cueing for sequencing;BSC;Grab bars;Ambulation;RW Toilet Transfer Details (indicate cue type and reason): pt needed extensive cues for safety and to make transfer. pt with R LE lagging with all transfer [place] and Hygiene: Minimal assistance Toileting - Clothing Manipulation Details (indicate cue type and reason): static standing with hip flexion for peri care     Functional mobility during ADLs: Maximal assistance General ADL Comments: Pt requires OT help advance L LE with each step. pt pushign RW to far ahead. pt with poor recall of information. pt heavy reliance on RW bil UE     Vision Baseline Vision/History: Wears glasses Wears Glasses: At all times       Perception     Praxis      Pertinent Vitals/Pain Pain Assessment: Faces Faces Pain Scale: Hurts even more Pain Location: L leg Pain Descriptors / Indicators: Grimacing Pain Intervention(s): Limited activity within patient's tolerance;Monitored during session;Premedicated before session;Repositioned     Hand Dominance Right   Extremity/Trunk Assessment Upper Extremity Assessment Upper Extremity Assessment: Overall WFL for tasks assessed   Lower Extremity Assessment Lower Extremity Assessment: Defer to PT evaluation   Cervical / Trunk Assessment Cervical / Trunk Assessment: Normal   Communication Communication Communication: No difficulties  Cognition Arousal/Alertness: Awake/alert Behavior During Therapy: WFL for tasks assessed/performed Overall Cognitive Status: Impaired/Different from baseline                 General Comments: Pt asking the same question several times. pt fixated on staff not placing a sheet prior on the chair surface and her leg sticking to the  chair leg rest. She expressed misundertanding of this level of care.    General Comments       Exercises       Shoulder Instructions      Home Living Family/patient expects to be discharged to:: Skilled nursing facility                                        Prior Functioning/Environment          Comments: unsure- pt randomly about spouse during session but later makes it appear she lives alone at [facility]        OT Problem List:        OT Treatment/Interventions:      OT Goals(Current goals can be found in the care plan section) Acute Rehab OT Goals Patient Stated Goal: to go to [facility] for rehab  OT Frequency:     Barriers to D/C:            Co-evaluation              End of Session Equipment Utilized During Treatment: Gait belt;Rolling walker Nurse Communication: Mobility status;Precautions  Activity Tolerance: Patient tolerated treatment well Patient left: in chair;with call bell/phone within reach  OT Visit Diagnosis: Unsteadiness on feet (R26.81)                ADL either performed or assessed with clinical judgement  Time: [id number] OT Time Calculation (min): 28 min Charges:  OT General Charges $OT Visit: 1 Procedure OT Evaluation $OT Eval Moderate Complexity: 1 Procedure OT Treatments $Self Care/Home Management : 8-22 mins G-Codes:      Jeri Modena   OTR/L Pager: [phone] Office: [phone] .   Parke Poisson B 08/21/2016, 2:54 PM

## 2016-08-21 NOTE — Progress Notes (Signed)
Physical Therapy Treatment Patient Details Name: Amber Bryant MRN: [id number] DOB: [birth year] Today's Date: 08/21/2016    History of Present Illness 83 y.o. female admitted to [facility] on 08/20/16 for elective L THA posterior. Pt with significant PMhx of essential HTN, L radial fx, and L femoral fx s/p ORIF (2012).     PT Comments    Pt was able to walk into the hallway today with RW min/mod assist overall for gait and mobility.  She is very slow to move and has no recall of her precautions.  PT will continue to follow acutely and pt is still appropriate for SNF level rehab.    Follow Up Recommendations  SNF ([facility])     Equipment Recommendations  None recommended by PT    Recommendations for Other Services   NA     Precautions / Restrictions Precautions Precautions: Posterior Hip;Fall Precaution Booklet Issued: Yes (comment) Precaution Comments: she still doesn't remeber any precautions or that she has precautions Restrictions LLE Weight Bearing: Weight bearing as tolerated    Mobility  Bed Mobility Overal bed mobility: Needs Assistance Bed Mobility: Supine to Sit     Supine to sit: Mod assist;HOB elevated     General bed mobility comments: Mod assist to support trunk to get to sitting, min assist to progress leg over EOB.  Transfers Overall transfer level: Needs assistance Equipment used: Rolling walker (2 wheeled) Transfers: Sit to/from Stand Sit to Stand: Min assist;From elevated surface         General transfer comment: Min assist to stand from bed and BSC both very elevated.  Verbal cues for safe hand placement.   Ambulation/Gait Ambulation/Gait assistance: Min assist Ambulation Distance (Feet): 35 Feet Assistive device: Rolling walker (2 wheeled) Gait Pattern/deviations: Step-to pattern;Antalgic Gait velocity: decreased Gait velocity interpretation: <1.8 ft/sec, indicative of risk for recurrent falls General Gait Details: Verbal cues for correct LE  sequencing, upright posture, closer proximity to RW and safe RW use in general.  Pt with very slow gait speed          Balance Overall balance assessment: Needs assistance Sitting-balance support: Feet supported;No upper extremity supported;Bilateral upper extremity supported Sitting balance-Leahy Scale: Poor Sitting balance - Comments: posterior lean seated EOB.   Postural control: Posterior lean Standing balance support: Bilateral upper extremity supported Standing balance-Leahy Scale: Poor                      Cognition Arousal/Alertness: Awake/alert Behavior During Therapy: WFL for tasks assessed/performed Overall Cognitive Status: Impaired/Different from baseline Area of Impairment: Memory     Memory: Decreased short-term memory;Decreased recall of precautions         General Comments: Pt seems to have some mild STM deficits.      Exercises Total Joint Exercises Ankle Circles/Pumps: AROM;AAROM;Right;Left;20 reps Heel Slides: AAROM;Left;10 reps Hip ABduction/ADduction: AAROM;Left;10 reps        Pertinent Vitals/Pain Pain Assessment: Faces Faces Pain Scale: Hurts whole lot Pain Location: left leg Pain Descriptors / Indicators: Grimacing Pain Intervention(s): Limited activity within patient's tolerance;Monitored during session;Repositioned           PT Goals (current goals can now be found in the care plan section) Acute Rehab PT Goals Patient Stated Goal: to go to [facility] for rehab Progress towards PT goals: Progressing toward goals    Frequency    7X/week      PT Plan Current plan remains appropriate       End of Session Equipment Utilized During  Treatment: Gait belt Activity Tolerance: Patient limited by pain Patient left: in chair;with call bell/phone within reach Nurse Communication: Mobility status PT Visit Diagnosis: Unsteadiness on feet (R26.81);Muscle weakness (generalized) (M62.81);Difficulty in walking, not elsewhere  classified (R26.2);Pain Pain - Right/Left: Left Pain - part of body: Hip     Time: [id number] PT Time Calculation (min) (ACUTE ONLY): 52 min  Charges:  $Gait Training: 23-37 mins $Therapeutic Exercise: 8-22 mins                       Loreta Blouch B. [place], [place], DPT [phone]   08/21/2016, 7:06 PM

## 2016-08-21 NOTE — NC FL2 (Signed)
[place] MEDICAID FL2 LEVEL OF CARE SCREENING TOOL     IDENTIFICATION  Patient Name: Amber Bryant Birthdate: 11/09/1928 Sex: female Admission Date (Current Location): 08/20/2016  [facility] and [place] Number:  Herbalist and Address:  The . [facility] [place] [address] [place], [place] [address]      Provider Number: [id number]  Attending Physician Name and Address:  Frederik Pear, MD  Relative Name and Phone Number:       Current Level of Care: Hospital Recommended Level of Care: [place] Prior Approval Number:    Date Approved/Denied:   PASRR Number: [id number]  Discharge Plan: SNF    Current Diagnoses: Patient Active Problem List   Diagnosis Date Noted  . Arthritis of right hip 08/20/2016  . Traumatic arthritis of hip, left 08/16/2016  . Hypokalemia 04/21/2016  . Vitamin D deficiency 04/21/2016  . Hypothyroidism   . Hyperlipidemia   . Essential hypertension   . PULMONARY NODULE 11/25/2007    Orientation RESPIRATION BLADDER Height & Weight     Time, Self, Situation, Place  O2 (Nasal Cannula 3L) Continent Weight:   Height:     BEHAVIORAL SYMPTOMS/MOOD NEUROLOGICAL BOWEL NUTRITION STATUS      Continent  (Please see discharge summary)  AMBULATORY STATUS COMMUNICATION OF NEEDS Skin   Extensive Assist Verbally Surgical wounds (Closed incision left leg)                       Personal Care Assistance Level of Assistance  Bathing, Feeding, Dressing Bathing Assistance: Maximum assistance Feeding assistance: Limited assistance Dressing Assistance: Maximum assistance     Functional Limitations Info  Sight, Hearing, Speech Sight Info: Adequate Hearing Info: Adequate Speech Info: Adequate    SPECIAL CARE FACTORS FREQUENCY  PT (By licensed PT), OT (By licensed OT)     PT Frequency: 7x week OT Frequency: 7x week            Contractures Contractures Info: Not present    Additional Factors Info  Code  Status, Allergies Code Status Info: Full COde Allergies Info: Amoxicillin           Current Medications (08/21/2016):  This is the current hospital active medication list Current Facility-Administered Medications  Medication Dose Route Frequency Provider Last Rate Last Dose  . acetaminophen (TYLENOL) tablet 650 mg  650 mg Oral Q6H PRN Leighton Parody, PA-C       Or  . acetaminophen (TYLENOL) suppository 650 mg  650 mg Rectal Q6H PRN Leighton Parody, PA-C      . alum & mag hydroxide-simeth (MAALOX/MYLANTA) 200-200-20 MG/5ML suspension 30 mL  30 mL Oral Q4H PRN Leighton Parody, PA-C      . aspirin EC tablet 325 mg  325 mg Oral Q breakfast Leighton Parody, PA-C   325 mg at 08/21/16 0816  . bisacodyl (DULCOLAX) EC tablet 5 mg  5 mg Oral Daily PRN Leighton Parody, PA-C      . dextrose 5 % and 0.45 % NaCl with KCl 20 mEq/L infusion   Intravenous Continuous Leighton Parody, PA-C 125 mL/hr at 08/20/16 1624    . diphenhydrAMINE (BENADRYL) 12.5 MG/5ML elixir 12.5-25 mg  12.5-25 mg Oral Q4H PRN Leighton Parody, PA-C      . docusate sodium (COLACE) capsule 100 mg  100 mg Oral BID Leighton Parody, PA-C   100 mg at 08/21/16 0816  . HYDROmorphone (DILAUDID) injection 0.5 mg  0.5  mg Intravenous Q2H PRN Leighton Parody, PA-C      . levothyroxine (SYNTHROID, LEVOTHROID) tablet 25 mcg  25 mcg Oral QAC breakfast Leighton Parody, PA-C   25 mcg at 08/21/16 [id number]  . menthol-cetylpyridinium (CEPACOL) lozenge 3 mg  1 lozenge Oral PRN Leighton Parody, PA-C       Or  . phenol (CHLORASEPTIC) mouth spray 1 spray  1 spray Mouth/Throat PRN Leighton Parody, PA-C      . methocarbamol (ROBAXIN) tablet 500 mg  500 mg Oral Q6H PRN Leighton Parody, PA-C   500 mg at 08/21/16 [id number]   Or  . methocarbamol (ROBAXIN) 500 mg in dextrose 5 % 50 mL IVPB  500 mg Intravenous Q6H PRN Leighton Parody, PA-C      . metoCLOPramide (REGLAN) tablet 5-10 mg  5-10 mg Oral Q8H PRN Leighton Parody, PA-C       Or  . metoCLOPramide (REGLAN) injection  5-10 mg  5-10 mg Intravenous Q8H PRN Leighton Parody, PA-C      . ondansetron [facility]) tablet 4 mg  4 mg Oral Q6H PRN Leighton Parody, PA-C       Or  . ondansetron [facility]) injection 4 mg  4 mg Intravenous Q6H PRN Leighton Parody, PA-C   4 mg at 08/20/16 2342  . oxyCODONE (Oxy IR/ROXICODONE) immediate release tablet 5-10 mg  5-10 mg Oral Q3H PRN Leighton Parody, PA-C   5 mg at 08/21/16 [id number]  . pravastatin (PRAVACHOL) tablet 20 mg  20 mg Oral q1800 Leighton Parody, PA-C   20 mg at 08/20/16 1843  . senna-docusate (Senokot-S) tablet 1 tablet  1 tablet Oral QHS PRN Leighton Parody, PA-C      . sodium phosphate (FLEET) 7-19 GM/118ML enema 1 enema  1 enema Rectal Once PRN Leighton Parody, PA-C      . triamterene-hydrochlorothiazide (MAXZIDE-25) 37.5-25 MG per tablet 1 tablet  1 tablet Oral Daily Leighton Parody, PA-C   1 tablet at 08/21/16 [id number]     Discharge Medications: Please see discharge summary for a list of discharge medications.  Relevant Imaging Results:  Relevant Lab Results:   Additional Information SSN: [id number]  Alla German, LCSW

## 2016-08-21 NOTE — Care Management (Signed)
Patient will discharge to [facility] SNF. Social worker has arranged. No CM needs.

## 2016-08-21 NOTE — Progress Notes (Signed)
PATIENT ID: Amber Bryant  MRN: [id number]  DOB/AGE:  1929-04-16 / 84 y.o.  1 Day Post-Op Procedure(s) (LRB): TOTAL HIP ARTHROPLASTY POSTERIOR REMOVE TROCH NAIL (Left)    PROGRESS NOTE Subjective: Patient is alert, oriented, no Nausea, no Vomiting, yes passing gas, . Taking PO well. Denies SOB, Chest or Calf Pain. Using Incentive Spirometer, PAS in place. Ambulate WBAT, up to BR Patient reports pain as  5/10  .    Objective: Vital signs in last 24 hours: Vitals:   08/20/16 1739 08/20/16 2027 08/21/16 0123 08/21/16 0615  BP:  (!) 107/56 112/68 120/68  Pulse: 96 80 90 90  Resp:  18 18 18   Temp:  97.8 F (36.6 C) 97.8 F (36.6 C) 97.5 F (36.4 C)  TempSrc:  Oral Oral Oral  SpO2: 98% 99% 99% 99%      Intake/Output from previous day: I/O last 3 completed shifts: In: 2508.8 [P.O.:200; I.V.:2308.8] Out: 1250 [Urine:1000; Blood:250]   Intake/Output this shift: No intake/output data recorded.   LABORATORY DATA:  Recent Labs  08/21/16 0619  WBC 8.4  HGB 9.4*  HCT 29.4*  PLT 190    Examination: Neurologically intact ABD soft Neurovascular intact Sensation intact distally Intact pulses distally Dorsiflexion/Plantar flexion intact Incision: dressing C/D/I No cellulitis present Compartment soft} XR AP&Lat of hip shows well placed\fixed THA< SROM  Assessment:   1 Day Post-Op Procedure(s) (LRB): TOTAL HIP ARTHROPLASTY POSTERIOR REMOVE TROCH NAIL (Left) ADDITIONAL DIAGNOSIS:  Expected Acute Blood Loss Anemia, Hypertension, hypothyroid  Plan: PT/OT WBAT, THA, posterior precautions  DVT Prophylaxis: SCDx72 hrs, ASA 325 mg BID x 2 weeks  DISCHARGE PLAN: Skilled Nursing Facility/Rehab, [facility]  DISCHARGE NEEDS: HHPT, Walker and 3-in-1 comode seat

## 2016-08-22 ENCOUNTER — Encounter: Payer: Self-pay | Admitting: Internal Medicine

## 2016-08-22 LAB — CBC
HEMATOCRIT: 29.6 % — AB (ref 36.0–46.0)
HEMOGLOBIN: 9.5 g/dL — AB (ref 12.0–15.0)
MCH: 30.4 pg (ref 26.0–34.0)
MCHC: 32.1 g/dL (ref 30.0–36.0)
MCV: 94.6 fL (ref 78.0–100.0)
Platelets: 186 10*3/uL (ref 150–400)
RBC: 3.13 MIL/uL — ABNORMAL LOW (ref 3.87–5.11)
RDW: 13.6 % (ref 11.5–15.5)
WBC: 11.8 10*3/uL — AB (ref 4.0–10.5)

## 2016-08-22 NOTE — Discharge Summary (Signed)
Patient ID: Amber Bryant MRN: [id number] DOB/AGE: 1929/05/04 83 y.o.  Admit Bryant: 08/20/2016 Discharge Bryant: 08/22/2016  Admission Diagnoses:  Principal Problem:   Traumatic arthritis of hip, left Active Problems:   Arthritis of right hip   Discharge Diagnoses:  Same  Past Medical History:  Diagnosis Bryant  . Arthritis   . Closed left femoral fracture ([place]) 2012   s/p ORIF, Dr. Tamera Punt  . Closed left radial fracture 2012   s/p reduction  . Complication of anesthesia 08/14/2016   not able to think clear for a long time afterwards  . Essential hypertension   . Hyperlipidemia   . Hypothyroidism     Surgeries: Procedure(s): TOTAL HIP ARTHROPLASTY POSTERIOR REMOVE [place] NAIL on 08/20/2016   Consultants:   Discharged Condition: Improved  Hospital Course: Amber Bryant is an 83 y.o. female who was admitted 08/20/2016 for operative treatment ofTraumatic arthritis of hip, left. Patient has severe unremitting pain that affects sleep, daily activities, and work/hobbies. After pre-op clearance the patient was taken to the operating room on 08/20/2016 and underwent  Procedure(s): [place].    Patient was given perioperative antibiotics: Anti-infectives    Start     Dose/Rate Route Frequency Ordered Stop   08/20/16 0830  vancomycin (VANCOCIN) IVPB 1000 mg/200 mL premix     1,000 mg 200 mL/hr over 60 Minutes Intravenous To ShortStay Surgical 08/17/16 0920 08/20/16 0816       Patient was given sequential compression devices, early ambulation, and chemoprophylaxis to prevent DVT.  Patient benefited maximally from hospital stay and there were no complications.    Recent vital signs: Patient Vitals for the past 24 hrs:  BP Temp Temp src Pulse Resp SpO2  08/22/16 0509 (!) 102/54 98 F (36.7 C) Oral 70 18 97 %  08/21/16 2014 128/68 98.1 F (36.7 C) Oral 85 18 95 %  08/21/16 1348 (!) 114/52 98.1 F (36.7 C) Oral 81 18 100 %     Recent  laboratory studies:  Recent Labs  08/21/16 0619 08/22/16 0635  WBC 8.4 11.8*  HGB 9.4* 9.5*  HCT 29.4* 29.6*  PLT 190 186     Discharge Medications:   Allergies as of 08/22/2016      Reactions   Amoxicillin    UNSPECIFIED REACTION  Has patient had a PCN reaction causing immediate rash, facial/tongue/throat swelling, SOB or lightheadedness with hypotension: Unknown Has patient had a PCN reaction causing severe rash involving mucus membranes or skin necrosis: Unknown Has patient had a PCN reaction that required hospitalization No Has patient had a PCN reaction occurring within the last 10 years: No If all of the above answers are "NO", then may proceed with Cephalosporin use.      Medication List    TAKE these medications   acetaminophen 500 MG tablet Commonly known as:  TYLENOL Take 1 tablet (500 mg total) by mouth daily as needed for mild pain.   aspirin EC 325 MG tablet Take 1 tablet (325 mg total) by mouth 2 (two) times daily. What changed:  medication strength  how much to take  when to take this   Cholecalciferol 2000 units Caps Take 1 capsule (2,000 Units total) by mouth daily.   lactose free nutrition Liqd Take 237 mLs by mouth daily.   levothyroxine 25 MCG tablet Commonly known as:  SYNTHROID, LEVOTHROID Take 1 tablet (25 mcg total) by mouth daily before breakfast.   lovastatin 20 MG tablet Commonly known as:  MEVACOR Take  1 tablet (20 mg total) by mouth at bedtime. What changed:  when to take this   naproxen sodium 220 MG tablet Commonly known as:  ANAPROX Take 220 mg by mouth daily as needed (pain).   oxyCODONE-acetaminophen 5-325 MG tablet Commonly known as:  ROXICET Take 1-2 tablets by mouth every 4 (four) hours as needed.   Potassium 99 MG Tabs Take 99 mg by mouth daily.   tiZANidine 2 MG tablet Commonly known as:  ZANAFLEX Take 1 tablet (2 mg total) by mouth every 6 (six) hours as needed for muscle spasms.    triamterene-hydrochlorothiazide 37.5-25 MG tablet Commonly known as:  MAXZIDE-25 Take 1 tablet by mouth daily.            Durable Medical Equipment        Start     Ordered   08/20/16 1529  DME Walker rolling  Once    Question:  Patient needs a walker to treat with the following condition  Answer:  Arthritis of right hip   08/20/16 1529   08/20/16 1529  DME 3 n 1  Once     08/20/16 1529   08/20/16 1529  DME Bedside commode  Once    Question:  Patient needs a bedside commode to treat with the following condition  Answer:  Arthritis of right hip   08/20/16 1529      Diagnostic Studies: Dg Pelvis Portable  Result Bryant: 08/20/2016 CLINICAL DATA:  Left total hip replacement. Arthritis of the left hip. EXAM: PORTABLE PELVIS 1-2 VIEWS COMPARISON:  None. FINDINGS: Acetabular and femoral components of the total hip prosthesis appear in excellent position in the AP projection. Old deformity of the proximal femur secondary to remote intertrochanteric fracture. Thinning of the lateral cortex of the midshaft of the left femur. IMPRESSION: Satisfactory appearance of the left hip prosthesis in the AP projection. Electronically Signed   By: Lorriane Shire M.D.   On: 08/20/2016 12:32    Disposition: 03-Skilled Nursing Facility  Discharge Instructions    Call MD / Call 911    Complete by:  As directed    If you experience chest pain or shortness of breath, CALL 911 and be transported to the hospital emergency room.  If you develope a fever above 101 F, pus (white drainage) or increased drainage or redness at the wound, or calf pain, call your surgeon's office.   Constipation Prevention    Complete by:  As directed    Drink plenty of fluids.  Prune juice may be helpful.  You may use a stool softener, such as Colace (over the counter) 100 mg twice a day.  Use MiraLax (over the counter) for constipation as needed.   Diet - low sodium heart healthy    Complete by:  As directed    Driving  restrictions    Complete by:  As directed    No driving for 2 weeks   Follow the hip precautions as taught in Physical Therapy    Complete by:  As directed    Increase activity slowly as tolerated    Complete by:  As directed    Patient may shower    Complete by:  As directed    You may shower without a dressing once there is no drainage.  Do not wash over the wound.  If drainage remains, cover wound with plastic wrap and then shower.      Follow-up Information    Kerin Salen, MD Follow up in 2  week(s).   Specialty:  Orthopedic Surgery Contact information: [place] [address] [phone]            Signed: Theodosia Quay 08/22/2016, 8:38 AM

## 2016-08-22 NOTE — Progress Notes (Signed)
Patient to be discharged to [facility] facility. Nurse called to give report to  [place]. IV removed. Patient is waiting for facilities wheelchair [name] to take her to the facility

## 2016-08-22 NOTE — Clinical Social Work Note (Signed)
Clinical Social Work Assessment  Patient Details  Name: Amber Bryant MRN: [id number] Date of Birth: [birth year]  Date of referral:  08/22/16               Reason for consult:  Facility Placement                Permission sought to share information with:    Permission granted to share information::     Name::        Agency::     Relationship::     Contact Information:     Housing/Transportation Living arrangements for the past 2 months:  [place] of Information:  Patient Patient Interpreter Needed:  None Criminal Activity/Legal Involvement Pertinent to Current Situation/Hospitalization:  No - Comment as needed Significant Relationships:  Adult Children Lives with:  Self Do you feel safe going back to the place where you live?  Yes Need for family participation in patient care:     Care giving concerns:  Pt's daughter called CSW to ask how pt was doing. Pt's daughter states the RN will not give her any information because she is not medical power of attorney. Pt's daughter states she she lives out of town and has the form that states she is MPOA however, is unable to get here in time before pt d/c. CSW explained that CSW does not evaluate any medical piece. CSW suggested that pt's daughter call pt in her room and get updates.    Social Worker assessment / plan:  Pt is alert and oriented. Pt is from [facility] and will be going to [facility]. Pt does not want to go by ambulance but rather wants [facility] to transport her back to facility as they transported her in. CSW has sent [facility] the referal and d/c summary at this time. RNCM Renee was also working with [facility] as this pt is a bundle pt. CSW will follow up with Renee to understand transport.  Employment status:  Retired Forensic scientist:  Medicare PT Recommendations:  [place] / Referral to community resources:  [place]  Patient/Family's  Response to care:  Pt verbalized understanding of CSW role and expressed appreciation for support. Pt denies any concern regarding pt care at this time.   Patient/Family's Understanding of and Emotional Response to Diagnosis, Current Treatment, and Prognosis:  Pt understanding and realistic regarding physical limitations. Pt understands the need for SNF placement at d/c. Pt agreeable to SNF placement at d/c, at this time. Pt's responses emotionally appropriate during conversation with CSW. Pt denies any concern regarding treatment plan at this time. CSW will continue to provide support and facilitate d/c needs.   Emotional Assessment Appearance:  Appears stated age Attitude/Demeanor/Rapport:   (Patient was appropriate.) Affect (typically observed):  Accepting, Appropriate Orientation:  Oriented to Self, Oriented to Place, Oriented to  Time, Oriented to Situation Alcohol / Substance use:  Not Applicable Psych involvement (Current and /or in the community):  No (Comment)  Discharge Needs  Concerns to be addressed:  No discharge needs identified Readmission within the last 30 days:  No Current discharge risk:  Dependent with Mobility Barriers to Discharge:  Continued Medical Work up   [employer], LCSW 08/22/2016, 10:13 AM

## 2016-08-22 NOTE — Clinical Social Work Placement (Signed)
   CLINICAL SOCIAL WORK PLACEMENT  NOTE  Date:  08/22/2016  Patient Details  Name: Amber Bryant MRN: [id number] Date of Birth: 1928-11-25  Clinical Social Work is seeking post-discharge placement for this patient at the [place] level of care (*CSW will initial, date and re-position this form in  chart as items are completed):      Patient/family provided with [place] Work Department's list of facilities offering this level of care within the geographic area requested by the patient (or if unable, by the patient's family).      Patient/family informed of their freedom to choose among providers that offer the needed level of care, that participate in Medicare, Medicaid or managed care program needed by the patient, have an available bed and are willing to accept the patient.      Patient/family informed of [facility]'s ownership interest in [facility] and [facility], as well as of the fact that they are under no obligation to receive care at these facilities.  PASRR submitted to EDS on       PASRR number received on 08/21/16     Existing PASRR number confirmed on       FL2 transmitted to all facilities in geographic area requested by pt/family on 08/21/16     FL2 transmitted to all facilities within larger geographic area on       Patient informed that his/her managed care company has contracts with or will negotiate with certain facilities, including the following:        Yes   Patient/family informed of bed offers received.  Patient chooses bed at [facility]     Physician recommends and patient chooses bed at      Patient to be transferred to [facility] on 08/22/16.  Patient to be transferred to facility by Facility transportation     Patient family notified on 08/22/16 of transfer.  Name of family member notified:        PHYSICIAN Please prepare prescriptions     Additional Comment:     _______________________________________________ Alla German, LCSW 08/22/2016, 10:18 AM

## 2016-08-22 NOTE — Progress Notes (Signed)
PATIENT ID: Lemma Tetro  MRN: [id number]  DOB/AGE:  09/13/1928 / 83 y.o.  2 Days Post-Op Procedure(s) (LRB): TOTAL HIP ARTHROPLASTY POSTERIOR REMOVE TROCH NAIL (Left)    PROGRESS NOTE Subjective: Patient is alert, oriented, no Nausea, no Vomiting, yes passing gas, . Taking PO well. Denies SOB, Chest or Calf Pain. Using Incentive Spirometer, PAS in place. Ambulate WBAT with pt walking 35 ft Patient reports pain as  1.5/10  .    Objective: Vital signs in last 24 hours: Vitals:   08/21/16 0615 08/21/16 1348 08/21/16 2014 08/22/16 0509  BP: 120/68 (!) 114/52 128/68 (!) 102/54  Pulse: 90 81 85 70  Resp: 18 18 18 18   Temp: 97.5 F (36.4 C) 98.1 F (36.7 C) 98.1 F (36.7 C) 98 F (36.7 C)  TempSrc: Oral Oral Oral Oral  SpO2: 99% 100% 95% 97%      Intake/Output from previous day: I/O last 3 completed shifts: In: 2663 [P.O.:650; I.V.:2013] Out: -    Intake/Output this shift: No intake/output data recorded.   LABORATORY DATA:  Recent Labs  08/21/16 0619 08/22/16 0635  WBC 8.4 11.8*  HGB 9.4* 9.5*  HCT 29.4* 29.6*  PLT 190 186    Examination: Neurologically intact Neurovascular intact Sensation intact distally Intact pulses distally Dorsiflexion/Plantar flexion intact Incision: dressing C/D/I No cellulitis present Compartment soft} XR AP&Lat of hip shows well placed\fixed THA  Assessment:   2 Days Post-Op Procedure(s) (LRB): TOTAL HIP ARTHROPLASTY POSTERIOR REMOVE TROCH NAIL (Left) ADDITIONAL DIAGNOSIS:  Expected Acute Blood Loss Anemia, Hypertension and hypothyroid  Plan: PT/OT WBAT, THA  DVT Prophylaxis: SCDx72 hrs, ASA 325 mg BID x 2 weeks  DISCHARGE PLAN: Skilled Nursing Facility/Rehab  DISCHARGE NEEDS: HHPT, Walker and 3-in-1 comode seat

## 2016-08-22 NOTE — Clinical Social Work Note (Signed)
Per [facility] facility will send a wheelchair van over between 1-1:30. If pt refuses to go by wheelchair [name] pt must be responsible for finding safe transportation to the facility.  RN to call report to [phone].  [address], [place]

## 2016-08-22 NOTE — Progress Notes (Signed)
Physical Therapy Treatment Patient Details Name: Amber Bryant MRN: [id number] DOB: 1929/03/28 Today's Date: 08/22/2016    History of Present Illness 83 y.o. female admitted to [facility] on 08/20/16 for elective L THA posterior. Pt with significant PMhx of essential HTN, L radial fx, and L femoral fx s/p ORIF (2012).     PT Comments    Pt is POD #2 and is progressing daily, yet slowly, with mobility.  She is starting to recall some of her posterior hip precautions after significant reinforcement.  She continues to benefit from SNF level rehab at discharge.  PT to follow acutely until d/c confirmed.     Follow Up Recommendations  SNF ([facility])     Equipment Recommendations  None recommended by PT    Recommendations for Other Services   NA     Precautions / Restrictions Precautions Precautions: Posterior Hip;Fall Precaution Booklet Issued: Yes (comment) Precaution Comments: pt remembered 2/3 hip precautions today,  much improved over previous sessions.  Restrictions LLE Weight Bearing: Weight bearing as tolerated    Mobility  Bed Mobility               General bed mobility comments: The patient is already OOB in the recliner chair.   Transfers Overall transfer level: Needs assistance Equipment used: Rolling walker (2 wheeled) Transfers: Sit to/from Stand Sit to Stand: Min assist         General transfer comment: Min assist with multiple attempts to stand and repeated cues for hand placement on recliner chair armrests to get to standing vs pulling on the RW.  Pt did finally coordinate getting to stand after multiple attemtps with only min assist to support her trunk and stabilize the RW for balance.   Ambulation/Gait Ambulation/Gait assistance: Min guard Ambulation Distance (Feet): 75 Feet Assistive device: Rolling walker (2 wheeled) Gait Pattern/deviations: Step-to pattern;Leaning posteriorly;Antalgic Gait velocity: decreased Gait velocity interpretation: <1.8  ft/sec, indicative of risk for recurrent falls General Gait Details: Verbal cues for upright posture, closer proximity to RW, and correct LE sequencing.  Pt with very slow gait speed.       Balance Overall balance assessment: Needs assistance Sitting-balance support: Feet supported;No upper extremity supported Sitting balance-Leahy Scale: Good     Standing balance support: Bilateral upper extremity supported Standing balance-Leahy Scale: Poor                      Cognition Arousal/Alertness: Awake/alert Behavior During Therapy: WFL for tasks assessed/performed Overall Cognitive Status: Impaired/Different from baseline Area of Impairment: Memory     Memory: Decreased short-term memory;Decreased recall of precautions         General Comments: STM and precautions recall improving, but she still says things that are not quite right throughout the session.  This may be her baseline.     Exercises Total Joint Exercises Ankle Circles/Pumps: AROM;AAROM;Both;20 reps Quad Sets: AROM;Both;10 reps Heel Slides: AAROM;Left;10 reps Hip ABduction/ADduction: AAROM;Left;10 reps Long Arc Quad: AROM;Both;10 reps        Pertinent Vitals/Pain Pain Assessment: Faces Faces Pain Scale: Hurts even more Pain Location: left hip Pain Descriptors / Indicators: Grimacing;Guarding Pain Intervention(s): Limited activity within patient's tolerance;Monitored during session;Repositioned           PT Goals (current goals can now be found in the care plan section) Acute Rehab PT Goals Patient Stated Goal: to go to [facility] for rehab Progress towards PT goals: Progressing toward goals    Frequency    7X/week  PT Plan Current plan remains appropriate       End of Session   Activity Tolerance: Patient limited by pain;Patient limited by fatigue Patient left: in chair;with call bell/phone within reach Nurse Communication: Mobility status PT Visit Diagnosis: Unsteadiness on  feet (R26.81);Muscle weakness (generalized) (M62.81);Difficulty in walking, not elsewhere classified (R26.2);Pain Pain - Right/Left: Left Pain - part of body: Hip     Time: 1610-9604 PT Time Calculation (min) (ACUTE ONLY): 46 min  Charges:  $Gait Training: 23-37 mins $Therapeutic Exercise: 8-22 mins                      Cheryl Chay B. [place], [place], DPT [phone]   08/22/2016, 3:02 PM

## 2016-08-23 DIAGNOSIS — M62562 Muscle wasting and atrophy, not elsewhere classified, left lower leg: Secondary | ICD-10-CM | POA: Diagnosis not present

## 2016-08-23 DIAGNOSIS — Z471 Aftercare following joint replacement surgery: Secondary | ICD-10-CM | POA: Diagnosis not present

## 2016-08-23 DIAGNOSIS — R2689 Other abnormalities of gait and mobility: Secondary | ICD-10-CM | POA: Diagnosis not present

## 2016-08-23 DIAGNOSIS — R278 Other lack of coordination: Secondary | ICD-10-CM | POA: Diagnosis not present

## 2016-08-23 DIAGNOSIS — M6289 Other specified disorders of muscle: Secondary | ICD-10-CM | POA: Diagnosis not present

## 2016-08-23 DIAGNOSIS — R2681 Unsteadiness on feet: Secondary | ICD-10-CM | POA: Diagnosis not present

## 2016-08-23 DIAGNOSIS — M62561 Muscle wasting and atrophy, not elsewhere classified, right lower leg: Secondary | ICD-10-CM | POA: Diagnosis not present

## 2016-08-23 DIAGNOSIS — M25552 Pain in left hip: Secondary | ICD-10-CM | POA: Diagnosis not present

## 2016-08-23 DIAGNOSIS — Z4732 Aftercare following explantation of hip joint prosthesis: Secondary | ICD-10-CM | POA: Diagnosis not present

## 2016-08-23 DIAGNOSIS — Z96642 Presence of left artificial hip joint: Secondary | ICD-10-CM | POA: Diagnosis not present

## 2016-08-23 DIAGNOSIS — Z9181 History of falling: Secondary | ICD-10-CM | POA: Diagnosis not present

## 2016-08-24 ENCOUNTER — Telehealth: Payer: Self-pay

## 2016-08-24 DIAGNOSIS — M62561 Muscle wasting and atrophy, not elsewhere classified, right lower leg: Secondary | ICD-10-CM | POA: Diagnosis not present

## 2016-08-24 DIAGNOSIS — R278 Other lack of coordination: Secondary | ICD-10-CM | POA: Diagnosis not present

## 2016-08-24 DIAGNOSIS — Z9181 History of falling: Secondary | ICD-10-CM | POA: Diagnosis not present

## 2016-08-24 DIAGNOSIS — M6289 Other specified disorders of muscle: Secondary | ICD-10-CM | POA: Diagnosis not present

## 2016-08-24 DIAGNOSIS — M62562 Muscle wasting and atrophy, not elsewhere classified, left lower leg: Secondary | ICD-10-CM | POA: Diagnosis not present

## 2016-08-24 DIAGNOSIS — R2689 Other abnormalities of gait and mobility: Secondary | ICD-10-CM | POA: Diagnosis not present

## 2016-08-24 NOTE — Telephone Encounter (Signed)
I have made the 1st attempt to contact the patient or family member in charge, in order to follow up from recently being discharged from the hospital. I left a message on voicemail but I will make another attempt at a different time. SG 08/24/16 @ 12:45

## 2016-08-25 DIAGNOSIS — M62562 Muscle wasting and atrophy, not elsewhere classified, left lower leg: Secondary | ICD-10-CM | POA: Diagnosis not present

## 2016-08-25 DIAGNOSIS — R278 Other lack of coordination: Secondary | ICD-10-CM | POA: Diagnosis not present

## 2016-08-25 DIAGNOSIS — R2689 Other abnormalities of gait and mobility: Secondary | ICD-10-CM | POA: Diagnosis not present

## 2016-08-25 DIAGNOSIS — Z9181 History of falling: Secondary | ICD-10-CM | POA: Diagnosis not present

## 2016-08-25 DIAGNOSIS — M6289 Other specified disorders of muscle: Secondary | ICD-10-CM | POA: Diagnosis not present

## 2016-08-25 DIAGNOSIS — M62561 Muscle wasting and atrophy, not elsewhere classified, right lower leg: Secondary | ICD-10-CM | POA: Diagnosis not present

## 2016-08-27 ENCOUNTER — Encounter: Payer: Self-pay | Admitting: Adult Health

## 2016-08-27 ENCOUNTER — Non-Acute Institutional Stay (SKILLED_NURSING_FACILITY): Payer: Medicare Other | Admitting: Adult Health

## 2016-08-27 DIAGNOSIS — I10 Essential (primary) hypertension: Secondary | ICD-10-CM | POA: Diagnosis not present

## 2016-08-27 DIAGNOSIS — E039 Hypothyroidism, unspecified: Secondary | ICD-10-CM | POA: Diagnosis not present

## 2016-08-27 DIAGNOSIS — M12552 Traumatic arthropathy, left hip: Secondary | ICD-10-CM

## 2016-08-27 DIAGNOSIS — Z9181 History of falling: Secondary | ICD-10-CM | POA: Diagnosis not present

## 2016-08-27 DIAGNOSIS — E785 Hyperlipidemia, unspecified: Secondary | ICD-10-CM

## 2016-08-27 DIAGNOSIS — Z96642 Presence of left artificial hip joint: Secondary | ICD-10-CM | POA: Diagnosis not present

## 2016-08-27 DIAGNOSIS — R278 Other lack of coordination: Secondary | ICD-10-CM | POA: Diagnosis not present

## 2016-08-27 DIAGNOSIS — R2689 Other abnormalities of gait and mobility: Secondary | ICD-10-CM | POA: Diagnosis not present

## 2016-08-27 DIAGNOSIS — K5903 Drug induced constipation: Secondary | ICD-10-CM | POA: Diagnosis not present

## 2016-08-27 DIAGNOSIS — R600 Localized edema: Secondary | ICD-10-CM | POA: Diagnosis not present

## 2016-08-27 DIAGNOSIS — K5900 Constipation, unspecified: Secondary | ICD-10-CM | POA: Insufficient documentation

## 2016-08-27 DIAGNOSIS — M62561 Muscle wasting and atrophy, not elsewhere classified, right lower leg: Secondary | ICD-10-CM | POA: Diagnosis not present

## 2016-08-27 DIAGNOSIS — M62562 Muscle wasting and atrophy, not elsewhere classified, left lower leg: Secondary | ICD-10-CM | POA: Diagnosis not present

## 2016-08-27 DIAGNOSIS — M6289 Other specified disorders of muscle: Secondary | ICD-10-CM | POA: Diagnosis not present

## 2016-08-27 NOTE — Progress Notes (Signed)
Location:  Occupational psychologist of Service:  SNF (31) Provider:   Cindi Carbon, ANP [place] [phone]   REED, Jonelle Sidle, DO  Patient Care Team: Gayland Curry, DO as PCP - General (Geriatric Medicine) Sharyne Peach, MD as Consulting Physician (Ophthalmology) Tania Ade, MD as Consulting Physician (Orthopedic Surgery)  Extended Emergency Contact Information Primary Emergency Contact: Stoy,Burt Address: [place] [address] [place] Phone: [phone] Relation: None  Code Status:  Full code Goals of care: Advanced Directive information Advanced Directives 08/22/2016  Does Patient Have a Medical Advance Directive? Yes  Type of Paramedic of [place];Living will  Does patient want to make changes to medical advance directive? No - Patient declined  Copy of [place] in Chart? No - copy requested     Chief Complaint  Patient presents with  . Acute Visit    LLE edema    HPI:  Pt is a 83 y.o. female seen today for a hospital f/u s/p admission(08/20/16-08/22/16) from Left total hip arthroplasty with posterior approach/nail removal on 08/20/16 due to traumatic arthritis by Dr. Mayer Camel.  She is currently in rehab at [facility] for PT/OT and recovery. She refused to wear compression hose on arrival due to concern for increase pain during application.  She is on aspirin 325 mg BID for DVT prophylaxis. I was asked to see her today due to increased edema in the lower leg extending up to the left hip. The nurse reports the incision had increased serosang drainage as well. She has not had a fever or increased pain.  She is using percocet 1-2 x per day. She reports constipation and has been refusing colace due to inefficacy. She would like to try miralax. Her last BM was 2 days ago.   She is weight bearing as tolerated. Tolerating a heart healthy diet.  Past Medical  History:  Diagnosis Date  . Arthritis   . Closed left femoral fracture ([place]) 2012   s/p ORIF, Dr. Tamera Punt  . Closed left radial fracture 2012   s/p reduction  . Complication of anesthesia 08/14/2016   not able to think clear for a long time afterwards  . Essential hypertension   . Hyperlipidemia   . Hypothyroidism    Past Surgical History:  Procedure Laterality Date  . ABDOMINAL HYSTERECTOMY    . CATARACT EXTRACTION W/ INTRAOCULAR LENS  IMPLANT, BILATERAL Bilateral 2014  . ORIF FEMUR FRACTURE Left 04/08/2011   Dr. Tamera Punt  . TOTAL HIP ARTHROPLASTY Left 08/20/2016   Procedure: TOTAL HIP ARTHROPLASTY POSTERIOR REMOVE [place] NAIL;  Surgeon: Frederik Pear, MD;  Location: [facility];  Service: Orthopedics;  Laterality: Left;    Allergies  Allergen Reactions  . Amoxicillin     UNSPECIFIED REACTION   Has patient had a PCN reaction causing immediate rash, facial/tongue/throat swelling, SOB or lightheadedness with hypotension: Unknown Has patient had a PCN reaction causing severe rash involving mucus membranes or skin necrosis: Unknown Has patient had a PCN reaction that required hospitalization No Has patient had a PCN reaction occurring within the last 10 years: No If all of the above answers are "NO", then may proceed with Cephalosporin use.     Allergies as of 08/27/2016      Reactions   Amoxicillin    UNSPECIFIED REACTION  Has patient had a PCN reaction causing immediate rash, facial/tongue/throat swelling, SOB or lightheadedness with hypotension:  Unknown Has patient had a PCN reaction causing severe rash involving mucus membranes or skin necrosis: Unknown Has patient had a PCN reaction that required hospitalization No Has patient had a PCN reaction occurring within the last 10 years: No If all of the above answers are "NO", then may proceed with Cephalosporin use.      Medication List       Accurate as of 08/27/16 10:23 AM. Always use your most recent med list.            acetaminophen 500 MG tablet Commonly known as:  TYLENOL Take 1 tablet (500 mg total) by mouth daily as needed for mild pain.   aspirin EC 325 MG tablet Take 1 tablet (325 mg total) by mouth 2 (two) times daily.   Cholecalciferol 2000 units Caps Take 1 capsule (2,000 Units total) by mouth daily.   lactose free nutrition Liqd Take 237 mLs by mouth daily.   levothyroxine 25 MCG tablet Commonly known as:  SYNTHROID, LEVOTHROID Take 1 tablet (25 mcg total) by mouth daily before breakfast.   lovastatin 20 MG tablet Commonly known as:  MEVACOR Take 1 tablet (20 mg total) by mouth at bedtime.   naproxen sodium 220 MG tablet Commonly known as:  ANAPROX Take 220 mg by mouth daily as needed (pain).   oxyCODONE-acetaminophen 5-325 MG tablet Commonly known as:  ROXICET Take 1-2 tablets by mouth every 4 (four) hours as needed.   Potassium 99 MG Tabs Take 99 mg by mouth daily.   tiZANidine 2 MG tablet Commonly known as:  ZANAFLEX Take 1 tablet (2 mg total) by mouth every 6 (six) hours as needed for muscle spasms.   triamterene-hydrochlorothiazide 37.5-25 MG tablet Commonly known as:  MAXZIDE-25 Take 1 tablet by mouth daily.       Review of Systems  Constitutional: Negative for activity change, appetite change, chills, diaphoresis, fatigue, fever and unexpected weight change.  HENT: Negative for congestion.   Respiratory: Negative for cough, shortness of breath and wheezing.   Cardiovascular: Positive for leg swelling. Negative for chest pain and palpitations.  Gastrointestinal: Positive for constipation. Negative for abdominal distention, abdominal pain and diarrhea.  Genitourinary: Negative for difficulty urinating and dysuria.  Musculoskeletal: Positive for arthralgias and gait problem. Negative for back pain, joint swelling and myalgias.  Neurological: Negative for dizziness, tremors, seizures, syncope, facial asymmetry, speech difficulty, weakness, light-headedness, numbness  and headaches.  Psychiatric/Behavioral: Negative for agitation, behavioral problems and confusion.    Immunization History  Administered Date(s) Administered  . H1N1 05/27/2008  . Influenza, High Dose Seasonal PF 03/23/2016  . Influenza-Unspecified 03/18/2016   Pertinent  Health Maintenance Due  Topic Date Due  . DEXA SCAN  06/23/1995  . PNA vac Low Risk Adult (1 of 2 - PCV13) 06/23/1995  . INFLUENZA VACCINE  Completed   Fall Risk  06/27/2016 04/18/2016  Falls in the past year? No No   Functional Status Survey:    Vitals:   08/27/16 1021  BP: (!) 105/55  Pulse: 74  Resp: 20  Temp: (!) 96.8 F (36 C)  SpO2: 97%   There is no height or weight on file to calculate BMI. Physical Exam  Constitutional: She is oriented to person, place, and time. No distress.  HENT:  Head: Normocephalic and atraumatic.  Neck: No JVD present.  Cardiovascular: Normal rate and regular rhythm.   Murmur heard. Left leg with 2+ edema from the ankle to the hip, tight skin, no erythema. No calf tenderness.  BPPP+ CMS intact  Pulmonary/Chest: Effort normal and breath sounds normal. No respiratory distress. She has no wheezes.  Abdominal: Soft. Bowel sounds are normal. She exhibits no distension.  Neurological: She is alert and oriented to person, place, and time.  Skin: Skin is warm and dry. No rash noted. She is not diaphoretic. No erythema. No pallor.  Left hip dressing in place with sero sang drainage.   Psychiatric: She has a normal mood and affect.  Nursing note and vitals reviewed.   Labs reviewed:  Recent Labs  04/16/16 08/14/16 1057  NA 143 141  K 4.6 4.1  CL  --  105  CO2  --  28  GLUCOSE  --  93  BUN 21 21*  CREATININE 0.9 0.87  CALCIUM  --  9.8    Recent Labs  04/16/16  AST 16  ALT 9  ALKPHOS 91    Recent Labs  08/14/16 1057 08/21/16 0619 08/22/16 0635  WBC 6.8 8.4 11.8*  NEUTROABS 4.4  --   --   HGB 12.1 9.4* 9.5*  HCT 37.5 29.4* 29.6*  MCV 94.9 94.5 94.6    PLT 255 190 186   Lab Results  Component Value Date   TSH 2.50 04/16/2016   Lab Results  Component Value Date   HGBA1C 5.7 (H) 04/10/2011   Lab Results  Component Value Date   CHOL 191 04/16/2016   HDL 75 (A) 04/16/2016   LDLCALC 104 04/16/2016   TRIG 61 04/16/2016    Significant Diagnostic Results in last 30 days:  No results found.  Assessment/Plan 1. Leg edema, left ? If this is due to dependency/surgery and not wearing prescribed compression hose vs. DVT Venous ultrasound R/O DVT LLE Ace wrap to BLE (resident does not like hose) I have asked the staff to contact her surgeon and update him regarding her care   2. Status post total replacement of left hip Hold PT until doppler completed and negative Continue percocet as needed for pain Continue ASA 325 mg BID (2 weeks)  3. Traumatic arthritis of hip, left S/p #1  4. Hypothyroidism, unspecified type Continue synthroid 25 mcg qd  5. Hyperlipidemia, unspecified hyperlipidemia type Continue mevacor 20 mg qd  6. Essential hypertension Controlled Continue traimterene/hctz 37.5/25 qd  7. Drug-induced constipation Miralax 17 grams qd D/c colace  Family/ staff Communication: discussed with resident and nurse Katharine Look at [facility]  Labs/tests ordered: venous doppler left

## 2016-08-28 ENCOUNTER — Encounter: Payer: Self-pay | Admitting: Internal Medicine

## 2016-08-28 ENCOUNTER — Non-Acute Institutional Stay (SKILLED_NURSING_FACILITY): Payer: Medicare Other | Admitting: Internal Medicine

## 2016-08-28 DIAGNOSIS — R2689 Other abnormalities of gait and mobility: Secondary | ICD-10-CM | POA: Diagnosis not present

## 2016-08-28 DIAGNOSIS — E876 Hypokalemia: Secondary | ICD-10-CM | POA: Diagnosis not present

## 2016-08-28 DIAGNOSIS — R278 Other lack of coordination: Secondary | ICD-10-CM | POA: Diagnosis not present

## 2016-08-28 DIAGNOSIS — M62561 Muscle wasting and atrophy, not elsewhere classified, right lower leg: Secondary | ICD-10-CM | POA: Diagnosis not present

## 2016-08-28 DIAGNOSIS — T402X5A Adverse effect of other opioids, initial encounter: Secondary | ICD-10-CM | POA: Diagnosis not present

## 2016-08-28 DIAGNOSIS — E559 Vitamin D deficiency, unspecified: Secondary | ICD-10-CM

## 2016-08-28 DIAGNOSIS — I10 Essential (primary) hypertension: Secondary | ICD-10-CM

## 2016-08-28 DIAGNOSIS — E785 Hyperlipidemia, unspecified: Secondary | ICD-10-CM

## 2016-08-28 DIAGNOSIS — M12552 Traumatic arthropathy, left hip: Secondary | ICD-10-CM

## 2016-08-28 DIAGNOSIS — M62562 Muscle wasting and atrophy, not elsewhere classified, left lower leg: Secondary | ICD-10-CM | POA: Diagnosis not present

## 2016-08-28 DIAGNOSIS — M6289 Other specified disorders of muscle: Secondary | ICD-10-CM | POA: Diagnosis not present

## 2016-08-28 DIAGNOSIS — R600 Localized edema: Secondary | ICD-10-CM | POA: Diagnosis not present

## 2016-08-28 DIAGNOSIS — Z96642 Presence of left artificial hip joint: Secondary | ICD-10-CM

## 2016-08-28 DIAGNOSIS — M810 Age-related osteoporosis without current pathological fracture: Secondary | ICD-10-CM | POA: Insufficient documentation

## 2016-08-28 DIAGNOSIS — E039 Hypothyroidism, unspecified: Secondary | ICD-10-CM | POA: Diagnosis not present

## 2016-08-28 DIAGNOSIS — R52 Pain, unspecified: Secondary | ICD-10-CM | POA: Diagnosis not present

## 2016-08-28 DIAGNOSIS — K5903 Drug induced constipation: Secondary | ICD-10-CM | POA: Diagnosis not present

## 2016-08-28 DIAGNOSIS — Z9181 History of falling: Secondary | ICD-10-CM | POA: Diagnosis not present

## 2016-08-28 NOTE — Progress Notes (Signed)
Patient ID: Amber Bryant, female   DOB: 03/06/1929, 83 y.o.   MRN: [id number]  Provider:  Rexene Edison. Mariea Clonts, D.O., C.M.D. Location:  [facility] Room Number: [place] of Service:  SNF (31)  PCP: Hollace Kinnier, DO Patient Care Team: Gayland Curry, DO as PCP - General (Geriatric Medicine) Sharyne Peach, MD as Consulting Physician (Ophthalmology) Tania Ade, MD as Consulting Physician (Orthopedic Surgery)  Extended Emergency Contact Information Primary Emergency Contact: Wimbush,Burt Address: [place] [address] [place] Phone: [phone] Relation: None  Code Status: DNR Goals of Care: Advanced Directive information Advanced Directives 08/28/2016  Does Patient Have a Medical Advance Directive? Yes  Type of Advance Directive Living will;Healthcare Power of Attorney  Does patient want to make changes to medical advance directive? -  Copy of [place] in Chart? Yes   Chief Complaint  Patient presents with  . New Admit To SNF    Rehab admission    HPI: Patient is a 83 y.o. female with h/o traumatic OA left hip, prior left radial fx, htn, hyperlipidemia, hypothyroidism seen today for admission to rehab at [facility] s/p left hip replacement (posterior approach, nail removal) by Dr. Mayer Camel.  She was seen by NP yesterday due to increased left leg edema after refusing to wear her compression hose (is on asa bid prophylaxis).  She also c/o constipation despite colace and wanted miralax which was started.  Her preliminary report of her venous ultrasound was negative for DVT in the left leg this afternoon.  Past Medical History:  Diagnosis Date  . Arthritis   . Closed left femoral fracture ([facility]) 2012   s/p ORIF, Dr. Tamera Punt  . Closed left radial fracture 2012   s/p reduction  . Complication of anesthesia 08/14/2016   not able to think clear for a long time afterwards  .  Essential hypertension   . Hyperlipidemia   . Hypothyroidism    Past Surgical History:  Procedure Laterality Date  . ABDOMINAL HYSTERECTOMY    . CATARACT EXTRACTION W/ INTRAOCULAR LENS  IMPLANT, BILATERAL Bilateral 2014  . ORIF FEMUR FRACTURE Left 04/08/2011   Dr. Tamera Punt  . TOTAL HIP ARTHROPLASTY Left 08/20/2016   Procedure: TOTAL HIP ARTHROPLASTY POSTERIOR REMOVE [place] NAIL;  Surgeon: Frederik Pear, MD;  Location: [facility];  Service: Orthopedics;  Laterality: Left;    reports that she has never smoked. She has never used smokeless tobacco. She reports that she does not drink alcohol or use drugs. Social History   Social History  . Marital status: Married    Spouse name: N/A  . Number of children: N/A  . Years of education: N/A   Occupational History  . Not on file.   Social History Main Topics  . Smoking status: Never Smoker  . Smokeless tobacco: Never Used  . Alcohol use No  . Drug use: No  . Sexual activity: No   Other Topics Concern  . Not on file   Social History Narrative   Tobacco use, amount per day now: NONE   Past tobacco use, amount per day: NONE   How many years did you use tobacco: NONE   Alcohol use (drinks per week): 1   Diet: ONGOING   Do you drink/eat things with caffeine: YES   Marital status: WIDOWED  What year were you married? 1956   Do you live in a house, apartment, assisted living, condo, trailer, etc.? [place] ([place]   Is it one or more stories? ONE   How many persons live in your home? ONE   Do you have pets in your home?( please list) 3 DOGS   Current or past profession: PUBLISHER   Do you exercise?   YES                               Type and how often? SWIM DAILY   Do you have a living will? YES   Do you have a DNR form?   YES                                If not, do you want to discuss one?   Do you have signed POA/HPOA forms?  YES                      If so, please bring to you appointment        Functional Status Survey:  requiring help to restroom at this time and with ADL care due to recent hip surgery, normally entirely independent and swimming at [employer]"  Family History  Problem Relation Age of Onset  . Cancer Mother   . Heart disease Father     Health Maintenance  Topic Date Due  . TETANUS/TDAP  06/22/1949  . DEXA SCAN  06/23/1995  . PNA vac Low Risk Adult (1 of 2 - PCV13) 06/23/1995  . INFLUENZA VACCINE  Completed    Allergies  Allergen Reactions  . Amoxicillin     UNSPECIFIED REACTION   Has patient had a PCN reaction causing immediate rash, facial/tongue/throat swelling, SOB or lightheadedness with hypotension: Unknown Has patient had a PCN reaction causing severe rash involving mucus membranes or skin necrosis: Unknown Has patient had a PCN reaction that required hospitalization No Has patient had a PCN reaction occurring within the last 10 years: No If all of the above answers are "NO", then may proceed with Cephalosporin use.     Allergies as of 08/28/2016      Reactions   Amoxicillin    UNSPECIFIED REACTION  Has patient had a PCN reaction causing immediate rash, facial/tongue/throat swelling, SOB or lightheadedness with hypotension: Unknown Has patient had a PCN reaction causing severe rash involving mucus membranes or skin necrosis: Unknown Has patient had a PCN reaction that required hospitalization No Has patient had a PCN reaction occurring within the last 10 years: No If all of the above answers are "NO", then may proceed with Cephalosporin use.      Medication List       Accurate as of 08/28/16 12:00 PM. Always use your most recent med list.          acetaminophen 500 MG tablet Commonly known as:  TYLENOL Take 500 mg by mouth daily as needed.   aspirin EC 325 MG tablet Take 1 tablet (325 mg total) by mouth 2 (two) times daily.   Cholecalciferol 2000 units Caps Take 1 capsule (2,000 Units total) by mouth daily.   lactose free  nutrition Liqd Take 237 mLs by mouth daily.   levothyroxine 25 MCG tablet Commonly known as:  SYNTHROID, LEVOTHROID Take 1 tablet (25 mcg total) by mouth daily before breakfast.  lovastatin 20 MG tablet Commonly known as:  MEVACOR Take 20 mg by mouth at bedtime.   polyethylene glycol packet Commonly known as:  MIRALAX / GLYCOLAX Take 17 g by mouth daily.   potassium chloride SA 20 MEQ tablet Commonly known as:  K-DUR,KLOR-CON Take 20 mEq by mouth daily.   triamterene-hydrochlorothiazide 37.5-25 MG tablet Commonly known as:  MAXZIDE-25 Take 1 tablet by mouth daily.       Review of Systems  Constitutional: Positive for activity change. Negative for appetite change, chills, fever and unexpected weight change.  HENT: Negative for congestion and hearing loss.   Eyes: Negative for visual disturbance.  Respiratory: Negative for apnea, cough, chest tightness, shortness of breath and wheezing.   Cardiovascular: Positive for leg swelling.       Left greater than right  Gastrointestinal: Negative for abdominal pain, blood in stool, constipation, diarrhea, nausea and vomiting.       Constipation better now  Genitourinary: Negative for dysuria.  Musculoskeletal: Positive for arthralgias and gait problem.       Left leg pain overall  Skin: Negative for color change and pallor.  Neurological: Negative for dizziness and weakness.  Hematological: Does not bruise/bleed easily.  Psychiatric/Behavioral: Negative for agitation, behavioral problems, confusion and suicidal ideas.       Some word finding difficulty noted since I've met her a few months ago    Vitals:   08/28/16 1138  BP: 110/60  Pulse: 65  Resp: 17  Temp: 98 F (36.7 C)  TempSrc: Oral  SpO2: 96%   There is no height or weight on file to calculate BMI. Physical Exam  Constitutional: She appears well-developed and well-nourished. No distress.  HENT:  Head: Normocephalic and atraumatic.  Right Ear: External ear  normal.  Left Ear: External ear normal.  Nose: Nose normal.  Mouth/Throat: Oropharynx is clear and moist.  Eyes: Conjunctivae and EOM are normal. Pupils are equal, round, and reactive to light.  Neck: Normal range of motion. Neck supple. No JVD present.  Pulmonary/Chest: Effort normal and breath sounds normal.  Abdominal: Soft. Bowel sounds are normal. She exhibits no distension and no mass. There is no tenderness. There is no rebound and no guarding.  Musculoskeletal: Normal range of motion. She exhibits tenderness.  Entire left leg is tender and diffusely swollen, still has betadine on it; using walker to get to restroom  Lymphadenopathy:    She has no cervical adenopathy.  Skin: Skin is warm and dry.  Two dressings in place on left lateral thigh, no drainage outside of these; small laceration near left groin also that is scabbed over  Psychiatric: She has a normal mood and affect.    Labs reviewed: Basic Metabolic Panel:  Recent Labs  04/16/16 08/14/16 1057  NA 143 141  K 4.6 4.1  CL  --  105  CO2  --  28  GLUCOSE  --  93  BUN 21 21*  CREATININE 0.9 0.87  CALCIUM  --  9.8   Liver Function Tests:  Recent Labs  04/16/16  AST 16  ALT 9  ALKPHOS 91   No results for input(s): LIPASE, AMYLASE in the last 8760 hours. No results for input(s): AMMONIA in the last 8760 hours. CBC:  Recent Labs  08/14/16 1057 08/21/16 0619 08/22/16 0635  WBC 6.8 8.4 11.8*  NEUTROABS 4.4  --   --   HGB 12.1 9.4* 9.5*  HCT 37.5 29.4* 29.6*  MCV 94.9 94.5 94.6  PLT 255 190  186   Cardiac Enzymes: No results for input(s): CKTOTAL, CKMB, CKMBINDEX, TROPONINI in the last 8760 hours. BNP: Invalid input(s): POCBNP Lab Results  Component Value Date   HGBA1C 5.7 (H) 04/10/2011   Lab Results  Component Value Date   TSH 2.50 04/16/2016  Assessment/Plan 1. Traumatic arthritis of hip, left -s/p #2  2. S/P hip replacement, left -using very little pain medication -cont tylenol, has prn  percocet and zanaflex also, but not using these -may need to take the percocet pre-PT which was on hold due to edema of left leg -may resume PT when final venous US returns  3. Therapeutic opioid induced constipation -cont miralax daily which has been successful  4. Hypothyroidism, unspecified type -cont current synthroid  5. Hyperlipidemia, unspecified hyperlipidemia type -cont lovastatin and monitor in outpatient world  6. Hypokalemia -was taking some otc potassium which I changed to real potassium, will check bmp to make sure she needs this  7. Essential hypertension -cont on triamterene/hctz which typically does not require potassium supplementation -pt calls it her water pill  8. Vitamin D deficiency -cont vitamin D 2000 units daily for this and her osteoporosis (prior wrist fx)  9. Senile osteoporosis -cont vitamin D and work on getting back to her good weighbearing exercise status  Pharmacist, hospital Communication: discussed with pt and rehab nursing  Labs/tests ordered:  bmp

## 2016-08-29 ENCOUNTER — Encounter: Payer: Self-pay | Admitting: Internal Medicine

## 2016-08-29 DIAGNOSIS — Z9181 History of falling: Secondary | ICD-10-CM | POA: Diagnosis not present

## 2016-08-29 DIAGNOSIS — E876 Hypokalemia: Secondary | ICD-10-CM | POA: Diagnosis not present

## 2016-08-29 DIAGNOSIS — R2689 Other abnormalities of gait and mobility: Secondary | ICD-10-CM | POA: Diagnosis not present

## 2016-08-29 DIAGNOSIS — R278 Other lack of coordination: Secondary | ICD-10-CM | POA: Diagnosis not present

## 2016-08-29 DIAGNOSIS — M6289 Other specified disorders of muscle: Secondary | ICD-10-CM | POA: Diagnosis not present

## 2016-08-29 DIAGNOSIS — M62561 Muscle wasting and atrophy, not elsewhere classified, right lower leg: Secondary | ICD-10-CM | POA: Diagnosis not present

## 2016-08-29 DIAGNOSIS — M62562 Muscle wasting and atrophy, not elsewhere classified, left lower leg: Secondary | ICD-10-CM | POA: Diagnosis not present

## 2016-08-29 LAB — BASIC METABOLIC PANEL
BUN: 19 mg/dL (ref 4–21)
Creatinine: 0.8 mg/dL (ref 0.5–1.1)
Glucose: 94 mg/dL
Potassium: 4.1 mmol/L (ref 3.4–5.3)
Sodium: 140 mmol/L (ref 137–147)

## 2016-08-30 DIAGNOSIS — M62562 Muscle wasting and atrophy, not elsewhere classified, left lower leg: Secondary | ICD-10-CM | POA: Diagnosis not present

## 2016-08-30 DIAGNOSIS — R2689 Other abnormalities of gait and mobility: Secondary | ICD-10-CM | POA: Diagnosis not present

## 2016-08-30 DIAGNOSIS — M62561 Muscle wasting and atrophy, not elsewhere classified, right lower leg: Secondary | ICD-10-CM | POA: Diagnosis not present

## 2016-08-30 DIAGNOSIS — R278 Other lack of coordination: Secondary | ICD-10-CM | POA: Diagnosis not present

## 2016-08-30 DIAGNOSIS — Z9181 History of falling: Secondary | ICD-10-CM | POA: Diagnosis not present

## 2016-08-30 DIAGNOSIS — M6289 Other specified disorders of muscle: Secondary | ICD-10-CM | POA: Diagnosis not present

## 2016-08-31 DIAGNOSIS — M6289 Other specified disorders of muscle: Secondary | ICD-10-CM | POA: Diagnosis not present

## 2016-08-31 DIAGNOSIS — R2689 Other abnormalities of gait and mobility: Secondary | ICD-10-CM | POA: Diagnosis not present

## 2016-08-31 DIAGNOSIS — Z9181 History of falling: Secondary | ICD-10-CM | POA: Diagnosis not present

## 2016-08-31 DIAGNOSIS — M62561 Muscle wasting and atrophy, not elsewhere classified, right lower leg: Secondary | ICD-10-CM | POA: Diagnosis not present

## 2016-08-31 DIAGNOSIS — R278 Other lack of coordination: Secondary | ICD-10-CM | POA: Diagnosis not present

## 2016-08-31 DIAGNOSIS — M62562 Muscle wasting and atrophy, not elsewhere classified, left lower leg: Secondary | ICD-10-CM | POA: Diagnosis not present

## 2016-09-01 DIAGNOSIS — M6289 Other specified disorders of muscle: Secondary | ICD-10-CM | POA: Diagnosis not present

## 2016-09-01 DIAGNOSIS — R278 Other lack of coordination: Secondary | ICD-10-CM | POA: Diagnosis not present

## 2016-09-01 DIAGNOSIS — M62562 Muscle wasting and atrophy, not elsewhere classified, left lower leg: Secondary | ICD-10-CM | POA: Diagnosis not present

## 2016-09-01 DIAGNOSIS — M62561 Muscle wasting and atrophy, not elsewhere classified, right lower leg: Secondary | ICD-10-CM | POA: Diagnosis not present

## 2016-09-01 DIAGNOSIS — Z9181 History of falling: Secondary | ICD-10-CM | POA: Diagnosis not present

## 2016-09-01 DIAGNOSIS — R2689 Other abnormalities of gait and mobility: Secondary | ICD-10-CM | POA: Diagnosis not present

## 2016-09-03 DIAGNOSIS — M62561 Muscle wasting and atrophy, not elsewhere classified, right lower leg: Secondary | ICD-10-CM | POA: Diagnosis not present

## 2016-09-03 DIAGNOSIS — R278 Other lack of coordination: Secondary | ICD-10-CM | POA: Diagnosis not present

## 2016-09-03 DIAGNOSIS — Z9181 History of falling: Secondary | ICD-10-CM | POA: Diagnosis not present

## 2016-09-03 DIAGNOSIS — M6289 Other specified disorders of muscle: Secondary | ICD-10-CM | POA: Diagnosis not present

## 2016-09-03 DIAGNOSIS — R2689 Other abnormalities of gait and mobility: Secondary | ICD-10-CM | POA: Diagnosis not present

## 2016-09-03 DIAGNOSIS — M62562 Muscle wasting and atrophy, not elsewhere classified, left lower leg: Secondary | ICD-10-CM | POA: Diagnosis not present

## 2016-09-04 DIAGNOSIS — Z471 Aftercare following joint replacement surgery: Secondary | ICD-10-CM | POA: Diagnosis not present

## 2016-09-04 DIAGNOSIS — R2689 Other abnormalities of gait and mobility: Secondary | ICD-10-CM | POA: Diagnosis not present

## 2016-09-04 DIAGNOSIS — Z9181 History of falling: Secondary | ICD-10-CM | POA: Diagnosis not present

## 2016-09-04 DIAGNOSIS — M62562 Muscle wasting and atrophy, not elsewhere classified, left lower leg: Secondary | ICD-10-CM | POA: Diagnosis not present

## 2016-09-04 DIAGNOSIS — Z96642 Presence of left artificial hip joint: Secondary | ICD-10-CM | POA: Diagnosis not present

## 2016-09-04 DIAGNOSIS — M1612 Unilateral primary osteoarthritis, left hip: Secondary | ICD-10-CM | POA: Diagnosis not present

## 2016-09-04 DIAGNOSIS — M6289 Other specified disorders of muscle: Secondary | ICD-10-CM | POA: Diagnosis not present

## 2016-09-04 DIAGNOSIS — R278 Other lack of coordination: Secondary | ICD-10-CM | POA: Diagnosis not present

## 2016-09-04 DIAGNOSIS — M62561 Muscle wasting and atrophy, not elsewhere classified, right lower leg: Secondary | ICD-10-CM | POA: Diagnosis not present

## 2016-09-05 DIAGNOSIS — M62561 Muscle wasting and atrophy, not elsewhere classified, right lower leg: Secondary | ICD-10-CM | POA: Diagnosis not present

## 2016-09-05 DIAGNOSIS — R2689 Other abnormalities of gait and mobility: Secondary | ICD-10-CM | POA: Diagnosis not present

## 2016-09-05 DIAGNOSIS — M6289 Other specified disorders of muscle: Secondary | ICD-10-CM | POA: Diagnosis not present

## 2016-09-05 DIAGNOSIS — R278 Other lack of coordination: Secondary | ICD-10-CM | POA: Diagnosis not present

## 2016-09-05 DIAGNOSIS — M62562 Muscle wasting and atrophy, not elsewhere classified, left lower leg: Secondary | ICD-10-CM | POA: Diagnosis not present

## 2016-09-05 DIAGNOSIS — Z9181 History of falling: Secondary | ICD-10-CM | POA: Diagnosis not present

## 2016-09-06 DIAGNOSIS — M62561 Muscle wasting and atrophy, not elsewhere classified, right lower leg: Secondary | ICD-10-CM | POA: Diagnosis not present

## 2016-09-06 DIAGNOSIS — R278 Other lack of coordination: Secondary | ICD-10-CM | POA: Diagnosis not present

## 2016-09-06 DIAGNOSIS — R2689 Other abnormalities of gait and mobility: Secondary | ICD-10-CM | POA: Diagnosis not present

## 2016-09-06 DIAGNOSIS — M6289 Other specified disorders of muscle: Secondary | ICD-10-CM | POA: Diagnosis not present

## 2016-09-06 DIAGNOSIS — M62562 Muscle wasting and atrophy, not elsewhere classified, left lower leg: Secondary | ICD-10-CM | POA: Diagnosis not present

## 2016-09-06 DIAGNOSIS — Z9181 History of falling: Secondary | ICD-10-CM | POA: Diagnosis not present

## 2016-09-07 DIAGNOSIS — R278 Other lack of coordination: Secondary | ICD-10-CM | POA: Diagnosis not present

## 2016-09-07 DIAGNOSIS — M62561 Muscle wasting and atrophy, not elsewhere classified, right lower leg: Secondary | ICD-10-CM | POA: Diagnosis not present

## 2016-09-07 DIAGNOSIS — M62562 Muscle wasting and atrophy, not elsewhere classified, left lower leg: Secondary | ICD-10-CM | POA: Diagnosis not present

## 2016-09-07 DIAGNOSIS — Z9181 History of falling: Secondary | ICD-10-CM | POA: Diagnosis not present

## 2016-09-07 DIAGNOSIS — M6289 Other specified disorders of muscle: Secondary | ICD-10-CM | POA: Diagnosis not present

## 2016-09-07 DIAGNOSIS — R2689 Other abnormalities of gait and mobility: Secondary | ICD-10-CM | POA: Diagnosis not present

## 2016-09-10 DIAGNOSIS — M62562 Muscle wasting and atrophy, not elsewhere classified, left lower leg: Secondary | ICD-10-CM | POA: Diagnosis not present

## 2016-09-10 DIAGNOSIS — R278 Other lack of coordination: Secondary | ICD-10-CM | POA: Diagnosis not present

## 2016-09-10 DIAGNOSIS — Z9181 History of falling: Secondary | ICD-10-CM | POA: Diagnosis not present

## 2016-09-10 DIAGNOSIS — M62561 Muscle wasting and atrophy, not elsewhere classified, right lower leg: Secondary | ICD-10-CM | POA: Diagnosis not present

## 2016-09-10 DIAGNOSIS — M6289 Other specified disorders of muscle: Secondary | ICD-10-CM | POA: Diagnosis not present

## 2016-09-10 DIAGNOSIS — R2689 Other abnormalities of gait and mobility: Secondary | ICD-10-CM | POA: Diagnosis not present

## 2016-09-11 ENCOUNTER — Non-Acute Institutional Stay (SKILLED_NURSING_FACILITY): Payer: Medicare Other | Admitting: Internal Medicine

## 2016-09-11 ENCOUNTER — Encounter: Payer: Self-pay | Admitting: Internal Medicine

## 2016-09-11 DIAGNOSIS — M62561 Muscle wasting and atrophy, not elsewhere classified, right lower leg: Secondary | ICD-10-CM | POA: Diagnosis not present

## 2016-09-11 DIAGNOSIS — M6289 Other specified disorders of muscle: Secondary | ICD-10-CM | POA: Diagnosis not present

## 2016-09-11 DIAGNOSIS — R278 Other lack of coordination: Secondary | ICD-10-CM | POA: Diagnosis not present

## 2016-09-11 DIAGNOSIS — R2689 Other abnormalities of gait and mobility: Secondary | ICD-10-CM | POA: Diagnosis not present

## 2016-09-11 DIAGNOSIS — Z9889 Other specified postprocedural states: Secondary | ICD-10-CM | POA: Diagnosis not present

## 2016-09-11 DIAGNOSIS — M7989 Other specified soft tissue disorders: Secondary | ICD-10-CM

## 2016-09-11 DIAGNOSIS — Z8679 Personal history of other diseases of the circulatory system: Secondary | ICD-10-CM

## 2016-09-11 DIAGNOSIS — M62562 Muscle wasting and atrophy, not elsewhere classified, left lower leg: Secondary | ICD-10-CM | POA: Diagnosis not present

## 2016-09-11 DIAGNOSIS — Z96642 Presence of left artificial hip joint: Secondary | ICD-10-CM

## 2016-09-11 DIAGNOSIS — Z9181 History of falling: Secondary | ICD-10-CM | POA: Diagnosis not present

## 2016-09-11 NOTE — Progress Notes (Signed)
Patient ID: Amber Bryant, female   DOB: [birth year], 83 y.o.   MRN: [id number]  Location:  [facility] Room Number: 150 rehab Place of Service:  SNF (31) Provider:  Garmon Dehn L. Mariea Clonts, D.O., C.M.D.  Hollace Kinnier, DO  Patient Care Team: Gayland Curry, DO as PCP - General (Geriatric Medicine) Sharyne Peach, MD as Consulting Physician (Ophthalmology) Tania Ade, MD as Consulting Physician (Orthopedic Surgery)  Extended Emergency Contact Information Primary Emergency Contact: Worthing,Burt Address: [place] [address] [place] Phone: [phone] Relation: None  Code Status:  DNR Goals of care: Advanced Directive information Advanced Directives 09/11/2016  Does Patient Have a Medical Advance Directive? Yes  Type of Paramedic of [place];Living will  Does patient want to make changes to medical advance directive? -  Copy of [place] in Chart? Yes   Chief Complaint  Patient presents with  . Acute Visit    LLE erythemia, warmth, swelling, s/p left total hip arthroplasty on 3/5    HPI:  Pt is a 83 y.o. female seen today for an acute visit for LLE erythema, warmth, swelling with recent left total hip arthroplasty on 08/20/16.  Pt reports no pain, she's unaware of the erythema and feels that overall the swelling of her leg is much better.  She had refused compression hose and ace wraps were being used as dvt prophylaxis along with her increased asa dose per orthopedics. She is up and about most of the day now and was actually about to go practice transfers with PT when I arrived so she can get in and out of a vehicle when she goes home in 3 more days if all is good.  She's been afebrile and her pain has been well controlled on her tylenol and no longer using percocet (was stopped).  Past Medical History:  Diagnosis Date  . Arthritis   . Closed left femoral  fracture ([facility]) 2012   s/p ORIF, Dr. Tamera Punt  . Closed left radial fracture 2012   s/p reduction  . Complication of anesthesia 08/14/2016   not able to think clear for a long time afterwards  . Essential hypertension   . Hyperlipidemia   . Hypothyroidism    Past Surgical History:  Procedure Laterality Date  . ABDOMINAL HYSTERECTOMY    . CATARACT EXTRACTION W/ INTRAOCULAR LENS  IMPLANT, BILATERAL Bilateral 2014  . ORIF FEMUR FRACTURE Left 04/08/2011   Dr. Tamera Punt  . TOTAL HIP ARTHROPLASTY Left 08/20/2016   Procedure: TOTAL HIP ARTHROPLASTY POSTERIOR REMOVE [place] NAIL;  Surgeon: Frederik Pear, MD;  Location: [facility];  Service: Orthopedics;  Laterality: Left;    Allergies  Allergen Reactions  . Amoxicillin     UNSPECIFIED REACTION   Has patient had a PCN reaction causing immediate rash, facial/tongue/throat swelling, SOB or lightheadedness with hypotension: Unknown Has patient had a PCN reaction causing severe rash involving mucus membranes or skin necrosis: Unknown Has patient had a PCN reaction that required hospitalization No Has patient had a PCN reaction occurring within the last 10 years: No If all of the above answers are "NO", then may proceed with Cephalosporin use.     Allergies as of 09/11/2016      Reactions   Amoxicillin    UNSPECIFIED REACTION  Has patient had a PCN reaction causing immediate rash, facial/tongue/throat swelling, SOB or lightheadedness with hypotension: Unknown Has patient had a PCN reaction  causing severe rash involving mucus membranes or skin necrosis: Unknown Has patient had a PCN reaction that required hospitalization No Has patient had a PCN reaction occurring within the last 10 years: No If all of the above answers are "NO", then may proceed with Cephalosporin use.      Medication List       Accurate as of 09/11/16  3:41 PM. Always use your most recent med list.          acetaminophen 500 MG tablet Commonly known as:  TYLENOL Take 500 mg  by mouth 3 (three) times daily. As needed at bedtime   aspirin EC 81 MG tablet Take 81 mg by mouth daily.   Cholecalciferol 2000 units Caps Take 1 capsule (2,000 Units total) by mouth daily.   lactose free nutrition Liqd Take 237 mLs by mouth daily.   levothyroxine 25 MCG tablet Commonly known as:  SYNTHROID, LEVOTHROID Take 1 tablet (25 mcg total) by mouth daily before breakfast.   lovastatin 20 MG tablet Commonly known as:  MEVACOR Take 20 mg by mouth at bedtime.   Melatonin 5 MG Tabs Take 1 tablet by mouth at bedtime.   polyethylene glycol packet Commonly known as:  MIRALAX / GLYCOLAX Take 17 g by mouth daily.   potassium chloride SA 20 MEQ tablet Commonly known as:  K-DUR,KLOR-CON Take 20 mEq by mouth daily.   traMADol 50 MG tablet Commonly known as:  ULTRAM Take 50 mg by mouth every 6 (six) hours as needed.   triamterene-hydrochlorothiazide 37.5-25 MG tablet Commonly known as:  MAXZIDE-25 Take 1 tablet by mouth daily.       Review of Systems  Constitutional: Negative for activity change, appetite change, chills, fatigue, fever and unexpected weight change.  HENT: Positive for hearing loss. Negative for congestion, sinus pain and trouble swallowing.   Eyes: Negative for visual disturbance.  Respiratory: Negative for apnea, chest tightness and shortness of breath.   Cardiovascular: Positive for leg swelling. Negative for chest pain and palpitations.  Gastrointestinal: Negative for abdominal pain, blood in stool, constipation, diarrhea, nausea and vomiting.  Genitourinary: Negative for dysuria.  Musculoskeletal: Positive for arthralgias, gait problem and joint swelling.  Neurological: Negative for dizziness and weakness.  Hematological: Negative for adenopathy. Does not bruise/bleed easily.  Psychiatric/Behavioral: Negative for confusion and sleep disturbance. The patient is not nervous/anxious.     Immunization History  Administered Date(s) Administered  .  H1N1 05/27/2008  . Influenza, High Dose Seasonal PF 03/23/2016  . Influenza-Unspecified 03/18/2016   Pertinent  Health Maintenance Due  Topic Date Due  . DEXA SCAN  06/23/1995  . PNA vac Low Risk Adult (1 of 2 - PCV13) 06/23/1995  . INFLUENZA VACCINE  Completed   Fall Risk  06/27/2016 04/18/2016  Falls in the past year? No No   Functional Status Survey:    Vitals:   09/11/16 1534  BP: 125/67  Pulse: 86  Temp: 97.3 F (36.3 C)  TempSrc: Oral   There is no height or weight on file to calculate BMI. Physical Exam  Constitutional: She appears well-developed and well-nourished. No distress.  Cardiovascular: Normal rate, regular rhythm, normal heart sounds and intact distal pulses.   Pulmonary/Chest: Effort normal and breath sounds normal. No respiratory distress.  Abdominal: Soft. Bowel sounds are normal.  Musculoskeletal: Normal range of motion.  Walking with walker  Skin: Skin is warm and dry. There is erythema.  Very mild erythema of left shin, but no warmth or tenderness present, only using  wraps on legs not compression hose as recommended due to discomfort, entire left leg swelling from hip down  Psychiatric: She has a normal mood and affect.    Labs reviewed:  Recent Labs  04/16/16 08/14/16 1057 08/29/16 0208  NA 143 141 140  K 4.6 4.1 4.1  CL  --  105  --   CO2  --  28  --   GLUCOSE  --  93  --   BUN 21 21* 19  CREATININE 0.9 0.87 0.8  CALCIUM  --  9.8  --     Recent Labs  04/16/16  AST 16  ALT 9  ALKPHOS 91    Recent Labs  08/14/16 1057 08/21/16 0619 08/22/16 0635  WBC 6.8 8.4 11.8*  NEUTROABS 4.4  --   --   HGB 12.1 9.4* 9.5*  HCT 37.5 29.4* 29.6*  MCV 94.9 94.5 94.6  PLT 255 190 186   Lab Results  Component Value Date   TSH 2.50 04/16/2016   Lab Results  Component Value Date   HGBA1C 5.7 (H) 04/10/2011   Lab Results  Component Value Date   CHOL 191 04/16/2016   HDL 75 (A) 04/16/2016   LDLCALC 104 04/16/2016   TRIG 61 04/16/2016      Significant Diagnostic Results in last 30 days:  Dg Pelvis Portable  Result Date: 08/20/2016 CLINICAL DATA:  Left total hip replacement. Arthritis of the left hip. EXAM: PORTABLE PELVIS 1-2 VIEWS COMPARISON:  None. FINDINGS: Acetabular and femoral components of the total hip prosthesis appear in excellent position in the AP projection. Old deformity of the proximal femur secondary to remote intertrochanteric fracture. Thinning of the lateral cortex of the midshaft of the left femur. IMPRESSION: Satisfactory appearance of the left hip prosthesis in the AP projection. Electronically Signed   By: Lorriane Shire M.D.   On: 08/20/2016 12:32    Assessment/Plan 1. Swelling of left lower extremity -from hip to toes, mild erythema of left shin--advised that if this worsens and becomes tender, treat with abx, but suspect simply due to some venous insufficiency due to #3 -elevated feet, pt again refuses compression hose but agrees to continue wraps  2. S/P hip replacement, left -making gradual progress with PT, soon will be going home--doing therapy transfers to vehicle today  3. History of varicose vein ligation -contributes to edema of left leg is my impression  Family/ staff Communication: discussed with rehab nurse  Labs/tests ordered:  No new

## 2016-09-12 DIAGNOSIS — R2689 Other abnormalities of gait and mobility: Secondary | ICD-10-CM | POA: Diagnosis not present

## 2016-09-12 DIAGNOSIS — M62561 Muscle wasting and atrophy, not elsewhere classified, right lower leg: Secondary | ICD-10-CM | POA: Diagnosis not present

## 2016-09-12 DIAGNOSIS — R278 Other lack of coordination: Secondary | ICD-10-CM | POA: Diagnosis not present

## 2016-09-12 DIAGNOSIS — M6289 Other specified disorders of muscle: Secondary | ICD-10-CM | POA: Diagnosis not present

## 2016-09-12 DIAGNOSIS — Z9181 History of falling: Secondary | ICD-10-CM | POA: Diagnosis not present

## 2016-09-12 DIAGNOSIS — M62562 Muscle wasting and atrophy, not elsewhere classified, left lower leg: Secondary | ICD-10-CM | POA: Diagnosis not present

## 2016-09-13 ENCOUNTER — Non-Acute Institutional Stay (SKILLED_NURSING_FACILITY): Payer: Medicare Other | Admitting: Adult Health

## 2016-09-13 DIAGNOSIS — R600 Localized edema: Secondary | ICD-10-CM

## 2016-09-13 DIAGNOSIS — E039 Hypothyroidism, unspecified: Secondary | ICD-10-CM

## 2016-09-13 DIAGNOSIS — I10 Essential (primary) hypertension: Secondary | ICD-10-CM

## 2016-09-13 DIAGNOSIS — M62561 Muscle wasting and atrophy, not elsewhere classified, right lower leg: Secondary | ICD-10-CM | POA: Diagnosis not present

## 2016-09-13 DIAGNOSIS — Z9181 History of falling: Secondary | ICD-10-CM | POA: Diagnosis not present

## 2016-09-13 DIAGNOSIS — K5903 Drug induced constipation: Secondary | ICD-10-CM | POA: Diagnosis not present

## 2016-09-13 DIAGNOSIS — M12552 Traumatic arthropathy, left hip: Secondary | ICD-10-CM | POA: Diagnosis not present

## 2016-09-13 DIAGNOSIS — E785 Hyperlipidemia, unspecified: Secondary | ICD-10-CM | POA: Diagnosis not present

## 2016-09-13 DIAGNOSIS — R278 Other lack of coordination: Secondary | ICD-10-CM | POA: Diagnosis not present

## 2016-09-13 DIAGNOSIS — M62562 Muscle wasting and atrophy, not elsewhere classified, left lower leg: Secondary | ICD-10-CM | POA: Diagnosis not present

## 2016-09-13 DIAGNOSIS — M6289 Other specified disorders of muscle: Secondary | ICD-10-CM | POA: Diagnosis not present

## 2016-09-13 DIAGNOSIS — R2689 Other abnormalities of gait and mobility: Secondary | ICD-10-CM | POA: Diagnosis not present

## 2016-09-13 DIAGNOSIS — M810 Age-related osteoporosis without current pathological fracture: Secondary | ICD-10-CM

## 2016-09-14 DIAGNOSIS — M62561 Muscle wasting and atrophy, not elsewhere classified, right lower leg: Secondary | ICD-10-CM | POA: Diagnosis not present

## 2016-09-14 DIAGNOSIS — R2689 Other abnormalities of gait and mobility: Secondary | ICD-10-CM | POA: Diagnosis not present

## 2016-09-14 DIAGNOSIS — M6289 Other specified disorders of muscle: Secondary | ICD-10-CM | POA: Diagnosis not present

## 2016-09-14 DIAGNOSIS — Z9181 History of falling: Secondary | ICD-10-CM | POA: Diagnosis not present

## 2016-09-14 DIAGNOSIS — R278 Other lack of coordination: Secondary | ICD-10-CM | POA: Diagnosis not present

## 2016-09-14 DIAGNOSIS — M62562 Muscle wasting and atrophy, not elsewhere classified, left lower leg: Secondary | ICD-10-CM | POA: Diagnosis not present

## 2016-09-17 DIAGNOSIS — M25552 Pain in left hip: Secondary | ICD-10-CM | POA: Diagnosis not present

## 2016-09-17 DIAGNOSIS — M6289 Other specified disorders of muscle: Secondary | ICD-10-CM | POA: Diagnosis not present

## 2016-09-17 DIAGNOSIS — Z4732 Aftercare following explantation of hip joint prosthesis: Secondary | ICD-10-CM | POA: Diagnosis not present

## 2016-09-17 DIAGNOSIS — R2689 Other abnormalities of gait and mobility: Secondary | ICD-10-CM | POA: Diagnosis not present

## 2016-09-17 DIAGNOSIS — Z9181 History of falling: Secondary | ICD-10-CM | POA: Diagnosis not present

## 2016-09-17 DIAGNOSIS — R2681 Unsteadiness on feet: Secondary | ICD-10-CM | POA: Diagnosis not present

## 2016-09-17 DIAGNOSIS — Z471 Aftercare following joint replacement surgery: Secondary | ICD-10-CM | POA: Diagnosis not present

## 2016-09-17 DIAGNOSIS — M62561 Muscle wasting and atrophy, not elsewhere classified, right lower leg: Secondary | ICD-10-CM | POA: Diagnosis not present

## 2016-09-17 DIAGNOSIS — R278 Other lack of coordination: Secondary | ICD-10-CM | POA: Diagnosis not present

## 2016-09-17 DIAGNOSIS — M62562 Muscle wasting and atrophy, not elsewhere classified, left lower leg: Secondary | ICD-10-CM | POA: Diagnosis not present

## 2016-09-17 DIAGNOSIS — Z96642 Presence of left artificial hip joint: Secondary | ICD-10-CM | POA: Diagnosis not present

## 2016-09-18 DIAGNOSIS — R278 Other lack of coordination: Secondary | ICD-10-CM | POA: Diagnosis not present

## 2016-09-18 DIAGNOSIS — R2689 Other abnormalities of gait and mobility: Secondary | ICD-10-CM | POA: Diagnosis not present

## 2016-09-18 DIAGNOSIS — M62562 Muscle wasting and atrophy, not elsewhere classified, left lower leg: Secondary | ICD-10-CM | POA: Diagnosis not present

## 2016-09-18 DIAGNOSIS — Z9181 History of falling: Secondary | ICD-10-CM | POA: Diagnosis not present

## 2016-09-18 DIAGNOSIS — M6289 Other specified disorders of muscle: Secondary | ICD-10-CM | POA: Diagnosis not present

## 2016-09-18 DIAGNOSIS — M62561 Muscle wasting and atrophy, not elsewhere classified, right lower leg: Secondary | ICD-10-CM | POA: Diagnosis not present

## 2016-09-19 DIAGNOSIS — R2689 Other abnormalities of gait and mobility: Secondary | ICD-10-CM | POA: Diagnosis not present

## 2016-09-19 DIAGNOSIS — Z9181 History of falling: Secondary | ICD-10-CM | POA: Diagnosis not present

## 2016-09-19 DIAGNOSIS — M6289 Other specified disorders of muscle: Secondary | ICD-10-CM | POA: Diagnosis not present

## 2016-09-19 DIAGNOSIS — M62561 Muscle wasting and atrophy, not elsewhere classified, right lower leg: Secondary | ICD-10-CM | POA: Diagnosis not present

## 2016-09-19 DIAGNOSIS — R278 Other lack of coordination: Secondary | ICD-10-CM | POA: Diagnosis not present

## 2016-09-19 DIAGNOSIS — M62562 Muscle wasting and atrophy, not elsewhere classified, left lower leg: Secondary | ICD-10-CM | POA: Diagnosis not present

## 2016-09-20 DIAGNOSIS — Z9181 History of falling: Secondary | ICD-10-CM | POA: Diagnosis not present

## 2016-09-20 DIAGNOSIS — M62561 Muscle wasting and atrophy, not elsewhere classified, right lower leg: Secondary | ICD-10-CM | POA: Diagnosis not present

## 2016-09-20 DIAGNOSIS — R2689 Other abnormalities of gait and mobility: Secondary | ICD-10-CM | POA: Diagnosis not present

## 2016-09-20 DIAGNOSIS — M62562 Muscle wasting and atrophy, not elsewhere classified, left lower leg: Secondary | ICD-10-CM | POA: Diagnosis not present

## 2016-09-20 DIAGNOSIS — M6289 Other specified disorders of muscle: Secondary | ICD-10-CM | POA: Diagnosis not present

## 2016-09-20 DIAGNOSIS — R278 Other lack of coordination: Secondary | ICD-10-CM | POA: Diagnosis not present

## 2016-09-21 DIAGNOSIS — Z9181 History of falling: Secondary | ICD-10-CM | POA: Diagnosis not present

## 2016-09-21 DIAGNOSIS — M62561 Muscle wasting and atrophy, not elsewhere classified, right lower leg: Secondary | ICD-10-CM | POA: Diagnosis not present

## 2016-09-21 DIAGNOSIS — M62562 Muscle wasting and atrophy, not elsewhere classified, left lower leg: Secondary | ICD-10-CM | POA: Diagnosis not present

## 2016-09-21 DIAGNOSIS — R2689 Other abnormalities of gait and mobility: Secondary | ICD-10-CM | POA: Diagnosis not present

## 2016-09-21 DIAGNOSIS — R278 Other lack of coordination: Secondary | ICD-10-CM | POA: Diagnosis not present

## 2016-09-21 DIAGNOSIS — M6289 Other specified disorders of muscle: Secondary | ICD-10-CM | POA: Diagnosis not present

## 2016-09-24 ENCOUNTER — Encounter: Payer: Self-pay | Admitting: Adult Health

## 2016-09-24 DIAGNOSIS — Z9181 History of falling: Secondary | ICD-10-CM | POA: Diagnosis not present

## 2016-09-24 DIAGNOSIS — M62562 Muscle wasting and atrophy, not elsewhere classified, left lower leg: Secondary | ICD-10-CM | POA: Diagnosis not present

## 2016-09-24 DIAGNOSIS — R278 Other lack of coordination: Secondary | ICD-10-CM | POA: Diagnosis not present

## 2016-09-24 DIAGNOSIS — M6289 Other specified disorders of muscle: Secondary | ICD-10-CM | POA: Diagnosis not present

## 2016-09-24 DIAGNOSIS — R2689 Other abnormalities of gait and mobility: Secondary | ICD-10-CM | POA: Diagnosis not present

## 2016-09-24 DIAGNOSIS — M62561 Muscle wasting and atrophy, not elsewhere classified, right lower leg: Secondary | ICD-10-CM | POA: Diagnosis not present

## 2016-09-24 NOTE — Progress Notes (Signed)
Location:  Occupational psychologist of Service:  SNF (31)  Provider:  Cindi Carbon, ANP [place] [phone]   PCP: Hollace Kinnier, DO Patient Care Team: Gayland Curry, DO as PCP - General (Geriatric Medicine) Sharyne Peach, MD as Consulting Physician (Ophthalmology) Tania Ade, MD as Consulting Physician (Orthopedic Surgery)  Extended Emergency Contact Information Primary Emergency Contact: Wadley,Burt Address: [place] [address] [place] Phone: [phone] Relation: None  Code Status: DNR Goals of care:  Advanced Directive information Advanced Directives 09/11/2016  Does Patient Have a Medical Advance Directive? Yes  Type of Paramedic of [place];Living will  Does patient want to make changes to medical advance directive? -  Copy of [place] in Chart? Yes     Allergies  Allergen Reactions  . Amoxicillin     UNSPECIFIED REACTION   Has patient had a PCN reaction causing immediate rash, facial/tongue/throat swelling, SOB or lightheadedness with hypotension: Unknown Has patient had a PCN reaction causing severe rash involving mucus membranes or skin necrosis: Unknown Has patient had a PCN reaction that required hospitalization No Has patient had a PCN reaction occurring within the last 10 years: No If all of the above answers are "NO", then may proceed with Cephalosporin use.     Chief Complaint  Patient presents with  . Discharge Note    HPI:  83 y.o. female  Admitted to rehab after a Left total hip arthroplasty with posterior approach/nail removal on 08/20/16 due to traumatic arthritis by Dr. Mayer Camel.  During her stay she refused to wear compression hose and had increased edema. A left lower ext doppler was ordered on 3/29 which was negative for acute DVT.  She tried compression wraps but did not like this either. She has been elevating  her legs but continues with edema to the LLE with mild erythema. No fever, calf tenderness, fever, or chest pain.  She is ready for discharge. She still requires some help with dressing the lower half and therefore caregivers will be with her at [place] home when she is discharged She continues to work with PT and OT and has made gains. She is no longer using percocet for pain. She reports regular BMs and a normal appetite. VS WNL   Past Medical History:  Diagnosis Date  . Arthritis   . Closed left femoral fracture ([place]) 2012   s/p ORIF, Dr. Tamera Punt  . Closed left radial fracture 2012   s/p reduction  . Complication of anesthesia 08/14/2016   not able to think clear for a long time afterwards  . Essential hypertension   . Hyperlipidemia   . Hypothyroidism     Past Surgical History:  Procedure Laterality Date  . ABDOMINAL HYSTERECTOMY    . CATARACT EXTRACTION W/ INTRAOCULAR LENS  IMPLANT, BILATERAL Bilateral 2014  . ORIF FEMUR FRACTURE Left 04/08/2011   Dr. Tamera Punt  . TOTAL HIP ARTHROPLASTY Left 08/20/2016   Procedure: TOTAL HIP ARTHROPLASTY POSTERIOR REMOVE [place] NAIL;  Surgeon: Frederik Pear, MD;  Location: [facility];  Service: Orthopedics;  Laterality: Left;      reports that she has never smoked. She has never used smokeless tobacco. She reports that she does not drink alcohol or use drugs. Social History   Social History  . Marital status: Married    Spouse name: N/A  . Number of children: N/A  . Years of  education: N/A   Occupational History  . Not on file.   Social History Main Topics  . Smoking status: Never Smoker  . Smokeless tobacco: Never Used  . Alcohol use No  . Drug use: No  . Sexual activity: No   Other Topics Concern  . Not on file   Social History Narrative   Tobacco use, amount per day now: NONE   Past tobacco use, amount per day: NONE   How many years did you use tobacco: NONE   Alcohol use (drinks per week): 1   Diet: ONGOING   Do you drink/eat  things with caffeine: YES   Marital status: WIDOWED                       What year were you married? 1956   Do you live in a house, apartment, assisted living, condo, trailer, etc.? [place] ([place])   Is it one or more stories? ONE   How many persons live in your home? ONE   Do you have pets in your home?( please list) 3 DOGS   Current or past profession: PUBLISHER   Do you exercise?   YES                               Type and how often? SWIM DAILY   Do you have a living will? YES   Do you have a DNR form?   YES                                If not, do you want to discuss one?   Do you have signed POA/HPOA forms?  YES                      If so, please bring to you appointment      Functional Status Survey:    Allergies  Allergen Reactions  . Amoxicillin     UNSPECIFIED REACTION   Has patient had a PCN reaction causing immediate rash, facial/tongue/throat swelling, SOB or lightheadedness with hypotension: Unknown Has patient had a PCN reaction causing severe rash involving mucus membranes or skin necrosis: Unknown Has patient had a PCN reaction that required hospitalization No Has patient had a PCN reaction occurring within the last 10 years: No If all of the above answers are "NO", then may proceed with Cephalosporin use.     Pertinent  Health Maintenance Due  Topic Date Due  . DEXA SCAN  06/23/1995  . PNA vac Low Risk Adult (1 of 2 - PCV13) 06/23/1995  . INFLUENZA VACCINE  01/16/2017    Medications: Allergies as of 09/13/2016      Reactions   Amoxicillin    UNSPECIFIED REACTION  Has patient had a PCN reaction causing immediate rash, facial/tongue/throat swelling, SOB or lightheadedness with hypotension: Unknown Has patient had a PCN reaction causing severe rash involving mucus membranes or skin necrosis: Unknown Has patient had a PCN reaction that required hospitalization No Has patient had a PCN reaction occurring within the last 10 years: No If all of  the above answers are "NO", then may proceed with Cephalosporin use.      Medication List       Accurate as of 09/13/16 11:59 PM. Always use your most recent med list.  acetaminophen 500 MG tablet Commonly known as:  TYLENOL Take 500 mg by mouth 3 (three) times daily. As needed at bedtime   aspirin EC 81 MG tablet Take 81 mg by mouth daily.   Cholecalciferol 2000 units Caps Take 1 capsule (2,000 Units total) by mouth daily.   lactose free nutrition Liqd Take 237 mLs by mouth daily.   levothyroxine 25 MCG tablet Commonly known as:  SYNTHROID, LEVOTHROID Take 1 tablet (25 mcg total) by mouth daily before breakfast.   lovastatin 20 MG tablet Commonly known as:  MEVACOR Take 20 mg by mouth at bedtime.   Melatonin 5 MG Tabs Take 1 tablet by mouth at bedtime.   polyethylene glycol packet Commonly known as:  MIRALAX / GLYCOLAX Take 17 g by mouth daily.   potassium chloride SA 20 MEQ tablet Commonly known as:  K-DUR,KLOR-CON Take 20 mEq by mouth daily.   traMADol 50 MG tablet Commonly known as:  ULTRAM Take 50 mg by mouth every 6 (six) hours as needed.   triamterene-hydrochlorothiazide 37.5-25 MG tablet Commonly known as:  MAXZIDE-25 Take 1 tablet by mouth daily.       Review of Systems  Constitutional: Negative for activity change, appetite change, chills, diaphoresis, fatigue, fever and unexpected weight change.  HENT: Negative for congestion.   Respiratory: Negative for cough, shortness of breath and wheezing.   Cardiovascular: Positive for leg swelling. Negative for chest pain and palpitations.  Gastrointestinal: Negative for abdominal distention, abdominal pain, constipation and diarrhea.  Genitourinary: Negative for difficulty urinating and dysuria.  Musculoskeletal: Positive for arthralgias and gait problem (uses walker). Negative for back pain, joint swelling and myalgias.  Neurological: Negative for dizziness, tremors, seizures, syncope, facial  asymmetry, speech difficulty, weakness, light-headedness, numbness and headaches.  Psychiatric/Behavioral: Negative for agitation, behavioral problems and confusion.    Vitals:   09/13/16 1134  BP: (!) 102/55  Pulse: 71  Resp: 20  Temp: 97.1 F (36.2 C)  SpO2: 97%  Weight: 181 lb (82.1 kg)   Body mass index is 30.12 kg/m. Physical Exam  Constitutional: She is oriented to person, place, and time. No distress.  HENT:  Head: Normocephalic and atraumatic.  Neck: No JVD present.  Cardiovascular: Normal rate and regular rhythm.   No murmur heard. Pulmonary/Chest: Effort normal and breath sounds normal. No respiratory distress. She has no wheezes.  Abdominal: Soft. Bowel sounds are normal. She exhibits no distension.  Musculoskeletal:  LLE edema +1, mild erythema. NO calf tenderness or warmth  Neurological: She is alert and oriented to person, place, and time.  Skin: Skin is warm and dry. She is not diaphoretic.  Psychiatric: She has a normal mood and affect.  Nursing note and vitals reviewed.   Labs reviewed: Basic Metabolic Panel:  Recent Labs  04/16/16 08/14/16 1057 08/29/16 0208  NA 143 141 140  K 4.6 4.1 4.1  CL  --  105  --   CO2  --  28  --   GLUCOSE  --  93  --   BUN 21 21* 19  CREATININE 0.9 0.87 0.8  CALCIUM  --  9.8  --    Liver Function Tests:  Recent Labs  04/16/16  AST 16  ALT 9  ALKPHOS 91   No results for input(s): LIPASE, AMYLASE in the last 8760 hours. No results for input(s): AMMONIA in the last 8760 hours. CBC:  Recent Labs  08/14/16 1057 08/21/16 0619 08/22/16 0635  WBC 6.8 8.4 11.8*  NEUTROABS 4.4  --   --  HGB 12.1 9.4* 9.5*  HCT 37.5 29.4* 29.6*  MCV 94.9 94.5 94.6  PLT 255 190 186   Cardiac Enzymes: No results for input(s): CKTOTAL, CKMB, CKMBINDEX, TROPONINI in the last 8760 hours. BNP: Invalid input(s): POCBNP CBG: No results for input(s): GLUCAP in the last 8760 hours.  Procedures and Imaging Studies During  Stay: No results found.  Assessment/Plan:    1. Traumatic arthritis of hip, left s/p arthroplasty Making improvements F/U with ortho as indicated Continue PT and OT  2. Essential hypertension Controlled Continue maxzide 37.5/25  3. Drug-induced constipation Continue miralax 17 grams qd  4. Hypothyroidism, unspecified type Continue synthroid 25 mcg qd  5. Hyperlipidemia, unspecified hyperlipidemia type Continue mevacor 20 mg qd  6. Constipation Continue miralax 17 grams qd  7. Edema Recommended compression but she can not wear it due to discomfort Elevate legs Education provided regarding notifying her physician if edema increase, calf pain, increased redness, fever, sob, or chest apin  Patient is being discharged with the following home health services:  PT and OT  Patient is being discharged with the following durable medical equipment:  NA  Patient has been advised to f/u with their PCP in 1-2 weeks to for a transitions of care visit.  Social services at their facility was responsible for arranging this appointment.  Pt was provided with adequate prescriptions of noncontrolled medications to reach the scheduled appointment .  For controlled substances, a limited supply was provided as appropriate for the individual patient.  If the pt normally receives these medications from a pain clinic or has a contract with another physician, these medications should be received from that clinic or physician only).    Future labs/tests needed:  NA

## 2016-09-25 DIAGNOSIS — M62562 Muscle wasting and atrophy, not elsewhere classified, left lower leg: Secondary | ICD-10-CM | POA: Diagnosis not present

## 2016-09-25 DIAGNOSIS — Z9181 History of falling: Secondary | ICD-10-CM | POA: Diagnosis not present

## 2016-09-25 DIAGNOSIS — R2689 Other abnormalities of gait and mobility: Secondary | ICD-10-CM | POA: Diagnosis not present

## 2016-09-25 DIAGNOSIS — R278 Other lack of coordination: Secondary | ICD-10-CM | POA: Diagnosis not present

## 2016-09-25 DIAGNOSIS — M62561 Muscle wasting and atrophy, not elsewhere classified, right lower leg: Secondary | ICD-10-CM | POA: Diagnosis not present

## 2016-09-25 DIAGNOSIS — M6289 Other specified disorders of muscle: Secondary | ICD-10-CM | POA: Diagnosis not present

## 2016-09-26 DIAGNOSIS — M62562 Muscle wasting and atrophy, not elsewhere classified, left lower leg: Secondary | ICD-10-CM | POA: Diagnosis not present

## 2016-09-26 DIAGNOSIS — Z9181 History of falling: Secondary | ICD-10-CM | POA: Diagnosis not present

## 2016-09-26 DIAGNOSIS — R278 Other lack of coordination: Secondary | ICD-10-CM | POA: Diagnosis not present

## 2016-09-26 DIAGNOSIS — M6289 Other specified disorders of muscle: Secondary | ICD-10-CM | POA: Diagnosis not present

## 2016-09-26 DIAGNOSIS — R2689 Other abnormalities of gait and mobility: Secondary | ICD-10-CM | POA: Diagnosis not present

## 2016-09-26 DIAGNOSIS — M62561 Muscle wasting and atrophy, not elsewhere classified, right lower leg: Secondary | ICD-10-CM | POA: Diagnosis not present

## 2016-09-27 DIAGNOSIS — R278 Other lack of coordination: Secondary | ICD-10-CM | POA: Diagnosis not present

## 2016-09-27 DIAGNOSIS — M6289 Other specified disorders of muscle: Secondary | ICD-10-CM | POA: Diagnosis not present

## 2016-09-27 DIAGNOSIS — M62562 Muscle wasting and atrophy, not elsewhere classified, left lower leg: Secondary | ICD-10-CM | POA: Diagnosis not present

## 2016-09-27 DIAGNOSIS — R2689 Other abnormalities of gait and mobility: Secondary | ICD-10-CM | POA: Diagnosis not present

## 2016-09-27 DIAGNOSIS — M62561 Muscle wasting and atrophy, not elsewhere classified, right lower leg: Secondary | ICD-10-CM | POA: Diagnosis not present

## 2016-09-27 DIAGNOSIS — Z9181 History of falling: Secondary | ICD-10-CM | POA: Diagnosis not present

## 2016-10-01 DIAGNOSIS — R278 Other lack of coordination: Secondary | ICD-10-CM | POA: Diagnosis not present

## 2016-10-01 DIAGNOSIS — Z9181 History of falling: Secondary | ICD-10-CM | POA: Diagnosis not present

## 2016-10-01 DIAGNOSIS — M62562 Muscle wasting and atrophy, not elsewhere classified, left lower leg: Secondary | ICD-10-CM | POA: Diagnosis not present

## 2016-10-01 DIAGNOSIS — M6289 Other specified disorders of muscle: Secondary | ICD-10-CM | POA: Diagnosis not present

## 2016-10-01 DIAGNOSIS — R2689 Other abnormalities of gait and mobility: Secondary | ICD-10-CM | POA: Diagnosis not present

## 2016-10-01 DIAGNOSIS — M62561 Muscle wasting and atrophy, not elsewhere classified, right lower leg: Secondary | ICD-10-CM | POA: Diagnosis not present

## 2016-10-02 ENCOUNTER — Other Ambulatory Visit (HOSPITAL_COMMUNITY): Payer: Self-pay | Admitting: Orthopedic Surgery

## 2016-10-02 DIAGNOSIS — R52 Pain, unspecified: Secondary | ICD-10-CM

## 2016-10-02 DIAGNOSIS — Z96642 Presence of left artificial hip joint: Secondary | ICD-10-CM | POA: Diagnosis not present

## 2016-10-03 DIAGNOSIS — R278 Other lack of coordination: Secondary | ICD-10-CM | POA: Diagnosis not present

## 2016-10-03 DIAGNOSIS — R2689 Other abnormalities of gait and mobility: Secondary | ICD-10-CM | POA: Diagnosis not present

## 2016-10-03 DIAGNOSIS — Z9181 History of falling: Secondary | ICD-10-CM | POA: Diagnosis not present

## 2016-10-03 DIAGNOSIS — M62562 Muscle wasting and atrophy, not elsewhere classified, left lower leg: Secondary | ICD-10-CM | POA: Diagnosis not present

## 2016-10-03 DIAGNOSIS — M6289 Other specified disorders of muscle: Secondary | ICD-10-CM | POA: Diagnosis not present

## 2016-10-03 DIAGNOSIS — M62561 Muscle wasting and atrophy, not elsewhere classified, right lower leg: Secondary | ICD-10-CM | POA: Diagnosis not present

## 2016-10-04 ENCOUNTER — Ambulatory Visit (HOSPITAL_COMMUNITY)
Admission: RE | Admit: 2016-10-04 | Discharge: 2016-10-04 | Disposition: A | Discharge status: Home / Self Care | Payer: Medicare Other | Source: Ambulatory Visit | Attending: Internal Medicine | Admitting: Internal Medicine

## 2016-10-04 DIAGNOSIS — R52 Pain, unspecified: Secondary | ICD-10-CM

## 2016-10-04 DIAGNOSIS — M79605 Pain in left leg: Secondary | ICD-10-CM | POA: Diagnosis not present

## 2016-10-04 DIAGNOSIS — M62561 Muscle wasting and atrophy, not elsewhere classified, right lower leg: Secondary | ICD-10-CM | POA: Diagnosis not present

## 2016-10-04 DIAGNOSIS — M6289 Other specified disorders of muscle: Secondary | ICD-10-CM | POA: Diagnosis not present

## 2016-10-04 DIAGNOSIS — M62562 Muscle wasting and atrophy, not elsewhere classified, left lower leg: Secondary | ICD-10-CM | POA: Diagnosis not present

## 2016-10-04 DIAGNOSIS — M7989 Other specified soft tissue disorders: Secondary | ICD-10-CM | POA: Insufficient documentation

## 2016-10-04 DIAGNOSIS — Z9181 History of falling: Secondary | ICD-10-CM | POA: Diagnosis not present

## 2016-10-04 DIAGNOSIS — R2689 Other abnormalities of gait and mobility: Secondary | ICD-10-CM | POA: Diagnosis not present

## 2016-10-04 DIAGNOSIS — R278 Other lack of coordination: Secondary | ICD-10-CM | POA: Diagnosis not present

## 2016-10-04 NOTE — Progress Notes (Signed)
VASCULAR LAB PRELIMINARY  PRELIMINARY  PRELIMINARY  PRELIMINARY  Left lower extremity venous duplex completed.    Preliminary report:  Left:  No evidence of DVT, superficial thrombosis, or Baker's cyst.  Erikka Follmer, RVS 10/04/2016, 2:55 PM

## 2016-10-05 DIAGNOSIS — M6289 Other specified disorders of muscle: Secondary | ICD-10-CM | POA: Diagnosis not present

## 2016-10-05 DIAGNOSIS — R2689 Other abnormalities of gait and mobility: Secondary | ICD-10-CM | POA: Diagnosis not present

## 2016-10-05 DIAGNOSIS — M62561 Muscle wasting and atrophy, not elsewhere classified, right lower leg: Secondary | ICD-10-CM | POA: Diagnosis not present

## 2016-10-05 DIAGNOSIS — M62562 Muscle wasting and atrophy, not elsewhere classified, left lower leg: Secondary | ICD-10-CM | POA: Diagnosis not present

## 2016-10-05 DIAGNOSIS — Z9181 History of falling: Secondary | ICD-10-CM | POA: Diagnosis not present

## 2016-10-05 DIAGNOSIS — R278 Other lack of coordination: Secondary | ICD-10-CM | POA: Diagnosis not present

## 2016-10-08 DIAGNOSIS — R2689 Other abnormalities of gait and mobility: Secondary | ICD-10-CM | POA: Diagnosis not present

## 2016-10-08 DIAGNOSIS — R278 Other lack of coordination: Secondary | ICD-10-CM | POA: Diagnosis not present

## 2016-10-08 DIAGNOSIS — M62562 Muscle wasting and atrophy, not elsewhere classified, left lower leg: Secondary | ICD-10-CM | POA: Diagnosis not present

## 2016-10-08 DIAGNOSIS — M62561 Muscle wasting and atrophy, not elsewhere classified, right lower leg: Secondary | ICD-10-CM | POA: Diagnosis not present

## 2016-10-08 DIAGNOSIS — M6289 Other specified disorders of muscle: Secondary | ICD-10-CM | POA: Diagnosis not present

## 2016-10-08 DIAGNOSIS — Z9181 History of falling: Secondary | ICD-10-CM | POA: Diagnosis not present

## 2016-10-09 DIAGNOSIS — M62562 Muscle wasting and atrophy, not elsewhere classified, left lower leg: Secondary | ICD-10-CM | POA: Diagnosis not present

## 2016-10-09 DIAGNOSIS — R2689 Other abnormalities of gait and mobility: Secondary | ICD-10-CM | POA: Diagnosis not present

## 2016-10-09 DIAGNOSIS — Z9181 History of falling: Secondary | ICD-10-CM | POA: Diagnosis not present

## 2016-10-09 DIAGNOSIS — M62561 Muscle wasting and atrophy, not elsewhere classified, right lower leg: Secondary | ICD-10-CM | POA: Diagnosis not present

## 2016-10-09 DIAGNOSIS — R278 Other lack of coordination: Secondary | ICD-10-CM | POA: Diagnosis not present

## 2016-10-09 DIAGNOSIS — M6289 Other specified disorders of muscle: Secondary | ICD-10-CM | POA: Diagnosis not present

## 2016-10-10 ENCOUNTER — Encounter: Payer: Self-pay | Admitting: Internal Medicine

## 2016-10-10 ENCOUNTER — Non-Acute Institutional Stay: Payer: Medicare Other | Admitting: Internal Medicine

## 2016-10-10 VITALS — BP 118/70 | HR 74 | Temp 98.0°F | Wt 182.0 lb

## 2016-10-10 DIAGNOSIS — M810 Age-related osteoporosis without current pathological fracture: Secondary | ICD-10-CM

## 2016-10-10 DIAGNOSIS — Z96642 Presence of left artificial hip joint: Secondary | ICD-10-CM

## 2016-10-10 DIAGNOSIS — E785 Hyperlipidemia, unspecified: Secondary | ICD-10-CM | POA: Diagnosis not present

## 2016-10-10 DIAGNOSIS — I10 Essential (primary) hypertension: Secondary | ICD-10-CM | POA: Diagnosis not present

## 2016-10-10 DIAGNOSIS — R600 Localized edema: Secondary | ICD-10-CM | POA: Diagnosis not present

## 2016-10-10 DIAGNOSIS — M12552 Traumatic arthropathy, left hip: Secondary | ICD-10-CM

## 2016-10-10 DIAGNOSIS — E039 Hypothyroidism, unspecified: Secondary | ICD-10-CM

## 2016-10-10 DIAGNOSIS — M62562 Muscle wasting and atrophy, not elsewhere classified, left lower leg: Secondary | ICD-10-CM | POA: Diagnosis not present

## 2016-10-10 DIAGNOSIS — R278 Other lack of coordination: Secondary | ICD-10-CM | POA: Diagnosis not present

## 2016-10-10 DIAGNOSIS — R2689 Other abnormalities of gait and mobility: Secondary | ICD-10-CM | POA: Diagnosis not present

## 2016-10-10 DIAGNOSIS — M6289 Other specified disorders of muscle: Secondary | ICD-10-CM | POA: Diagnosis not present

## 2016-10-10 DIAGNOSIS — M62561 Muscle wasting and atrophy, not elsewhere classified, right lower leg: Secondary | ICD-10-CM | POA: Diagnosis not present

## 2016-10-10 DIAGNOSIS — Z9181 History of falling: Secondary | ICD-10-CM | POA: Diagnosis not present

## 2016-10-10 NOTE — Progress Notes (Signed)
Location:  Occupational psychologist of Service:  Clinic (12)  Provider: Trenee Igoe L. Mariea Clonts, D.O., C.M.D.  Code Status: DNR Goals of Care:  Advanced Directives 10/10/2016  Does Patient Have a Medical Advance Directive? Yes  Type of Advance Directive [place]  Does patient want to make changes to medical advance directive? -  Copy of [place] in Chart? -   Chief Complaint  Patient presents with  . Medical Management of Chronic Issues    24mth follow-up from rehab    HPI: Patient is a 83 y.o. female seen today for medical management of chronic diseases and 1 month f/u from rehab for left hip replacement.  She is still ambulating with her walker.  She has trouble getting up and down out of chairs.  Not having pain except in the leg itself.  She went to get another vascular study on her left leg and it was negative for DVT.  She reports having had vein ligations may be why she has more swelling and pain in the lower leg.  She's using a cut stocking on the lower part of her leg.  She feels a poking sensation in the leg.  It's gradually going down.  She is elevating the left foot, she's doing the exercises.  She's been more sedentary and she's the heaviest she's ever been.  She says she's also old.  Had been swimming before but not there yet.  She can walk with the other walker with a seat.  She can walk down to the gym with that.  Says she'd like to try walking in the water.  Past Medical History:  Diagnosis Date  . Arthritis   . Closed left femoral fracture ([place]) 2012   s/p ORIF, Dr. Tamera Punt  . Closed left radial fracture 2012   s/p reduction  . Complication of anesthesia 08/14/2016   not able to think clear for a long time afterwards  . Essential hypertension   . Hyperlipidemia   . Hypothyroidism     Past Surgical History:  Procedure Laterality Date  . ABDOMINAL HYSTERECTOMY    . CATARACT EXTRACTION W/ INTRAOCULAR LENS   IMPLANT, BILATERAL Bilateral 2014  . ORIF FEMUR FRACTURE Left 04/08/2011   Dr. Tamera Punt  . TOTAL HIP ARTHROPLASTY Left 08/20/2016   Procedure: TOTAL HIP ARTHROPLASTY POSTERIOR REMOVE [place] NAIL;  Surgeon: Frederik Pear, MD;  Location: [facility];  Service: Orthopedics;  Laterality: Left;    Allergies  Allergen Reactions  . Amoxicillin     UNSPECIFIED REACTION   Has patient had a PCN reaction causing immediate rash, facial/tongue/throat swelling, SOB or lightheadedness with hypotension: Unknown Has patient had a PCN reaction causing severe rash involving mucus membranes or skin necrosis: Unknown Has patient had a PCN reaction that required hospitalization No Has patient had a PCN reaction occurring within the last 10 years: No If all of the above answers are "NO", then may proceed with Cephalosporin use.     Allergies as of 10/10/2016      Reactions   Amoxicillin    UNSPECIFIED REACTION  Has patient had a PCN reaction causing immediate rash, facial/tongue/throat swelling, SOB or lightheadedness with hypotension: Unknown Has patient had a PCN reaction causing severe rash involving mucus membranes or skin necrosis: Unknown Has patient had a PCN reaction that required hospitalization No Has patient had a PCN reaction occurring within the last 10 years: No If all of the above answers are "NO", then may  proceed with Cephalosporin use.      Medication List       Accurate as of 10/10/16  9:41 AM. Always use your most recent med list.          acetaminophen 500 MG tablet Commonly known as:  TYLENOL Take 500 mg by mouth 3 (three) times daily. As needed at bedtime   aspirin EC 81 MG tablet Take 81 mg by mouth daily.   Cholecalciferol 2000 units Caps Take 1 capsule (2,000 Units total) by mouth daily.   lactose free nutrition Liqd Take 237 mLs by mouth daily.   levothyroxine 25 MCG tablet Commonly known as:  SYNTHROID, LEVOTHROID Take 1 tablet (25 mcg total) by mouth daily before  breakfast.   lovastatin 20 MG tablet Commonly known as:  MEVACOR Take 20 mg by mouth at bedtime.   potassium chloride SA 20 MEQ tablet Commonly known as:  K-DUR,KLOR-CON Take 20 mEq by mouth daily.   triamterene-hydrochlorothiazide 37.5-25 MG tablet Commonly known as:  MAXZIDE-25 Take 1 tablet by mouth daily.       Review of Systems:  Review of Systems  Constitutional: Negative for chills, fever and malaise/fatigue.       Wt gain  HENT: Positive for hearing loss. Negative for tinnitus.   Eyes: Negative for blurred vision.  Respiratory: Negative for shortness of breath.   Cardiovascular: Negative for chest pain and palpitations.  Gastrointestinal: Negative for abdominal pain, blood in stool, constipation and melena.  Genitourinary: Negative for dysuria.  Musculoskeletal: Negative for falls.       Left lower leg pain ongoing, using walker  Skin: Negative for itching and rash.  Neurological: Negative for dizziness.  Endo/Heme/Allergies: Bruises/bleeds easily.  Psychiatric/Behavioral: Negative for depression and memory loss. The patient is nervous/anxious.        Some confusion about taking pills and boost this am?    Health Maintenance  Topic Date Due  . TETANUS/TDAP  06/22/1947  . DEXA SCAN  06/23/1995  . PNA vac Low Risk Adult (1 of 2 - PCV13) 06/23/1995  . INFLUENZA VACCINE  01/16/2017    Physical Exam: Vitals:   10/10/16 0929  BP: 118/70  Pulse: 74  Temp: 98 F (36.7 C)  TempSrc: Oral  SpO2: 97%  Weight: 182 lb (82.6 kg)   Body mass index is 30.29 kg/m. Physical Exam  Constitutional: She is oriented to person, place, and time. She appears well-developed and well-nourished. No distress.  Cardiovascular: Normal rate, regular rhythm, normal heart sounds and intact distal pulses.   Pulmonary/Chest: Effort normal and breath sounds normal. No respiratory distress. She has no wheezes.  Abdominal: Bowel sounds are normal.  Musculoskeletal: She exhibits no edema  or tenderness.  Still dragging her left leg some, walking with rolling walker with skis  Neurological: She is alert and oriented to person, place, and time.  Skin: Skin is warm and dry.  Psychiatric: She has a normal mood and affect.    Labs reviewed: Basic Metabolic Panel:  Recent Labs  04/16/16 08/14/16 1057 08/29/16 0208  NA 143 141 140  K 4.6 4.1 4.1  CL  --  105  --   CO2  --  28  --   GLUCOSE  --  93  --   BUN 21 21* 19  CREATININE 0.9 0.87 0.8  CALCIUM  --  9.8  --   TSH 2.50  --   --    Liver Function Tests:  Recent Labs  04/16/16  AST 16  ALT 9  ALKPHOS 91   No results for input(s): LIPASE, AMYLASE in the last 8760 hours. No results for input(s): AMMONIA in the last 8760 hours. CBC:  Recent Labs  08/14/16 1057 08/21/16 0619 08/22/16 0635  WBC 6.8 8.4 11.8*  NEUTROABS 4.4  --   --   HGB 12.1 9.4* 9.5*  HCT 37.5 29.4* 29.6*  MCV 94.9 94.5 94.6  PLT 255 190 186   Lipid Panel:  Recent Labs  04/16/16  CHOL 191  HDL 75*  LDLCALC 104  TRIG 61   Lab Results  Component Value Date   HGBA1C 5.7 (H) 04/10/2011    Assessment/Plan 1. Traumatic arthritis of hip, left -better s/p left hip replacement  2. Status post total replacement of left hip -still recovering from this, walking with rolling walker with skis and also has rollator with seat at home -still has swelling of left leg with two negative doppler studies -also has had postop anemia--I couldn't figure out if she actually got transfused or just typed and screened based on the lab section--needs f/u cbc either way and bmp  3. Localized edema -ongoing, but improving gradually, cont compression and elevation of her left leg at rest -no signs of cellulitis -suspect due to vein stripping she had in the past  4. Hypothyroidism, unspecified type -seems she is not taking her thyroid medication based on what she told me--advised that boost does not contain this and she must take it each am or she  will have weight gain and other complications -f/u tsh next week  5. Senile osteoporosis -cont vitamin D therapy and walking, try to get back to the pool as soon as she can safely do so, still using walker   6. Hyperlipidemia, unspecified hyperlipidemia type -lipids were satisfactory for an 83 yo female w/o any KNOWN CAD  7. Essential hypertension -bp well controlled on triamterene-hctz daily  Labs/tests ordered: cbc, bmp, tsh next week Next appt:  01/09/2017  Laure Leone L. Nettye Flegal, D.O. [facility] [address], [place] [address] Cell Phone (Mon-Fri 8am-5pm):  [phone] On Call:  [phone] & follow prompts after 5pm & weekends Office Phone:  [phone] Office Fax:  [phone]

## 2016-10-11 DIAGNOSIS — R278 Other lack of coordination: Secondary | ICD-10-CM | POA: Diagnosis not present

## 2016-10-11 DIAGNOSIS — M62562 Muscle wasting and atrophy, not elsewhere classified, left lower leg: Secondary | ICD-10-CM | POA: Diagnosis not present

## 2016-10-11 DIAGNOSIS — R2689 Other abnormalities of gait and mobility: Secondary | ICD-10-CM | POA: Diagnosis not present

## 2016-10-11 DIAGNOSIS — Z9181 History of falling: Secondary | ICD-10-CM | POA: Diagnosis not present

## 2016-10-11 DIAGNOSIS — M6289 Other specified disorders of muscle: Secondary | ICD-10-CM | POA: Diagnosis not present

## 2016-10-11 DIAGNOSIS — M62561 Muscle wasting and atrophy, not elsewhere classified, right lower leg: Secondary | ICD-10-CM | POA: Diagnosis not present

## 2016-10-12 DIAGNOSIS — Z9181 History of falling: Secondary | ICD-10-CM | POA: Diagnosis not present

## 2016-10-12 DIAGNOSIS — M62562 Muscle wasting and atrophy, not elsewhere classified, left lower leg: Secondary | ICD-10-CM | POA: Diagnosis not present

## 2016-10-12 DIAGNOSIS — M62561 Muscle wasting and atrophy, not elsewhere classified, right lower leg: Secondary | ICD-10-CM | POA: Diagnosis not present

## 2016-10-12 DIAGNOSIS — R2689 Other abnormalities of gait and mobility: Secondary | ICD-10-CM | POA: Diagnosis not present

## 2016-10-12 DIAGNOSIS — R278 Other lack of coordination: Secondary | ICD-10-CM | POA: Diagnosis not present

## 2016-10-12 DIAGNOSIS — M6289 Other specified disorders of muscle: Secondary | ICD-10-CM | POA: Diagnosis not present

## 2016-10-16 DIAGNOSIS — R2689 Other abnormalities of gait and mobility: Secondary | ICD-10-CM | POA: Diagnosis not present

## 2016-10-16 DIAGNOSIS — M62562 Muscle wasting and atrophy, not elsewhere classified, left lower leg: Secondary | ICD-10-CM | POA: Diagnosis not present

## 2016-10-16 DIAGNOSIS — M62561 Muscle wasting and atrophy, not elsewhere classified, right lower leg: Secondary | ICD-10-CM | POA: Diagnosis not present

## 2016-10-16 DIAGNOSIS — R2681 Unsteadiness on feet: Secondary | ICD-10-CM | POA: Diagnosis not present

## 2016-10-16 DIAGNOSIS — R278 Other lack of coordination: Secondary | ICD-10-CM | POA: Diagnosis not present

## 2016-10-16 DIAGNOSIS — Z4732 Aftercare following explantation of hip joint prosthesis: Secondary | ICD-10-CM | POA: Diagnosis not present

## 2016-10-17 DIAGNOSIS — M62562 Muscle wasting and atrophy, not elsewhere classified, left lower leg: Secondary | ICD-10-CM | POA: Diagnosis not present

## 2016-10-17 DIAGNOSIS — M62561 Muscle wasting and atrophy, not elsewhere classified, right lower leg: Secondary | ICD-10-CM | POA: Diagnosis not present

## 2016-10-17 DIAGNOSIS — Z4732 Aftercare following explantation of hip joint prosthesis: Secondary | ICD-10-CM | POA: Diagnosis not present

## 2016-10-17 DIAGNOSIS — R2681 Unsteadiness on feet: Secondary | ICD-10-CM | POA: Diagnosis not present

## 2016-10-17 DIAGNOSIS — R278 Other lack of coordination: Secondary | ICD-10-CM | POA: Diagnosis not present

## 2016-10-17 DIAGNOSIS — R2689 Other abnormalities of gait and mobility: Secondary | ICD-10-CM | POA: Diagnosis not present

## 2016-10-18 ENCOUNTER — Other Ambulatory Visit: Payer: Self-pay | Admitting: *Deleted

## 2016-10-18 DIAGNOSIS — I10 Essential (primary) hypertension: Secondary | ICD-10-CM | POA: Diagnosis not present

## 2016-10-18 DIAGNOSIS — E039 Hypothyroidism, unspecified: Secondary | ICD-10-CM | POA: Diagnosis not present

## 2016-10-18 DIAGNOSIS — E785 Hyperlipidemia, unspecified: Secondary | ICD-10-CM | POA: Diagnosis not present

## 2016-10-18 DIAGNOSIS — Z96642 Presence of left artificial hip joint: Secondary | ICD-10-CM | POA: Diagnosis not present

## 2016-10-18 DIAGNOSIS — M12552 Traumatic arthropathy, left hip: Secondary | ICD-10-CM | POA: Diagnosis not present

## 2016-10-18 DIAGNOSIS — R600 Localized edema: Secondary | ICD-10-CM | POA: Diagnosis not present

## 2016-10-18 DIAGNOSIS — D649 Anemia, unspecified: Secondary | ICD-10-CM | POA: Diagnosis not present

## 2016-10-18 LAB — CBC AND DIFFERENTIAL
HCT: 37 % (ref 36–46)
Hemoglobin: 11.5 g/dL — AB (ref 12.0–16.0)
Platelets: 277 10*3/uL (ref 150–399)
WBC: 6.1 10^3/mL

## 2016-10-18 LAB — BASIC METABOLIC PANEL
BUN: 18 mg/dL (ref 4–21)
Creatinine: 0.8 mg/dL (ref 0.5–1.1)
Glucose: 96 mg/dL
Potassium: 4.1 mmol/L (ref 3.4–5.3)
Sodium: 142 mmol/L (ref 137–147)

## 2016-10-18 LAB — TSH: TSH: 3.77 u[IU]/mL (ref 0.41–5.90)

## 2016-10-18 MED ORDER — LOVASTATIN 20 MG PO TABS: 20.0000 mg | ORAL_TABLET | Freq: Every day | ORAL | 0 refills | Status: DC

## 2016-10-19 DIAGNOSIS — M62562 Muscle wasting and atrophy, not elsewhere classified, left lower leg: Secondary | ICD-10-CM | POA: Diagnosis not present

## 2016-10-19 DIAGNOSIS — Z4732 Aftercare following explantation of hip joint prosthesis: Secondary | ICD-10-CM | POA: Diagnosis not present

## 2016-10-19 DIAGNOSIS — R2689 Other abnormalities of gait and mobility: Secondary | ICD-10-CM | POA: Diagnosis not present

## 2016-10-19 DIAGNOSIS — R2681 Unsteadiness on feet: Secondary | ICD-10-CM | POA: Diagnosis not present

## 2016-10-19 DIAGNOSIS — M62561 Muscle wasting and atrophy, not elsewhere classified, right lower leg: Secondary | ICD-10-CM | POA: Diagnosis not present

## 2016-10-19 DIAGNOSIS — R278 Other lack of coordination: Secondary | ICD-10-CM | POA: Diagnosis not present

## 2016-10-22 DIAGNOSIS — M62562 Muscle wasting and atrophy, not elsewhere classified, left lower leg: Secondary | ICD-10-CM | POA: Diagnosis not present

## 2016-10-22 DIAGNOSIS — M62561 Muscle wasting and atrophy, not elsewhere classified, right lower leg: Secondary | ICD-10-CM | POA: Diagnosis not present

## 2016-10-22 DIAGNOSIS — Z4732 Aftercare following explantation of hip joint prosthesis: Secondary | ICD-10-CM | POA: Diagnosis not present

## 2016-10-22 DIAGNOSIS — R2689 Other abnormalities of gait and mobility: Secondary | ICD-10-CM | POA: Diagnosis not present

## 2016-10-22 DIAGNOSIS — R2681 Unsteadiness on feet: Secondary | ICD-10-CM | POA: Diagnosis not present

## 2016-10-22 DIAGNOSIS — R278 Other lack of coordination: Secondary | ICD-10-CM | POA: Diagnosis not present

## 2016-10-24 ENCOUNTER — Encounter: Payer: Self-pay | Admitting: Internal Medicine

## 2016-10-25 DIAGNOSIS — R2681 Unsteadiness on feet: Secondary | ICD-10-CM | POA: Diagnosis not present

## 2016-10-25 DIAGNOSIS — R278 Other lack of coordination: Secondary | ICD-10-CM | POA: Diagnosis not present

## 2016-10-25 DIAGNOSIS — M62561 Muscle wasting and atrophy, not elsewhere classified, right lower leg: Secondary | ICD-10-CM | POA: Diagnosis not present

## 2016-10-25 DIAGNOSIS — R2689 Other abnormalities of gait and mobility: Secondary | ICD-10-CM | POA: Diagnosis not present

## 2016-10-25 DIAGNOSIS — Z4732 Aftercare following explantation of hip joint prosthesis: Secondary | ICD-10-CM | POA: Diagnosis not present

## 2016-10-25 DIAGNOSIS — M62562 Muscle wasting and atrophy, not elsewhere classified, left lower leg: Secondary | ICD-10-CM | POA: Diagnosis not present

## 2016-10-29 DIAGNOSIS — R278 Other lack of coordination: Secondary | ICD-10-CM | POA: Diagnosis not present

## 2016-10-29 DIAGNOSIS — R2681 Unsteadiness on feet: Secondary | ICD-10-CM | POA: Diagnosis not present

## 2016-10-29 DIAGNOSIS — M62561 Muscle wasting and atrophy, not elsewhere classified, right lower leg: Secondary | ICD-10-CM | POA: Diagnosis not present

## 2016-10-29 DIAGNOSIS — M62562 Muscle wasting and atrophy, not elsewhere classified, left lower leg: Secondary | ICD-10-CM | POA: Diagnosis not present

## 2016-10-29 DIAGNOSIS — Z4732 Aftercare following explantation of hip joint prosthesis: Secondary | ICD-10-CM | POA: Diagnosis not present

## 2016-10-29 DIAGNOSIS — R2689 Other abnormalities of gait and mobility: Secondary | ICD-10-CM | POA: Diagnosis not present

## 2016-10-31 DIAGNOSIS — R2689 Other abnormalities of gait and mobility: Secondary | ICD-10-CM | POA: Diagnosis not present

## 2016-10-31 DIAGNOSIS — M62562 Muscle wasting and atrophy, not elsewhere classified, left lower leg: Secondary | ICD-10-CM | POA: Diagnosis not present

## 2016-10-31 DIAGNOSIS — Z4732 Aftercare following explantation of hip joint prosthesis: Secondary | ICD-10-CM | POA: Diagnosis not present

## 2016-10-31 DIAGNOSIS — R278 Other lack of coordination: Secondary | ICD-10-CM | POA: Diagnosis not present

## 2016-10-31 DIAGNOSIS — R2681 Unsteadiness on feet: Secondary | ICD-10-CM | POA: Diagnosis not present

## 2016-10-31 DIAGNOSIS — M62561 Muscle wasting and atrophy, not elsewhere classified, right lower leg: Secondary | ICD-10-CM | POA: Diagnosis not present

## 2016-11-02 DIAGNOSIS — Z4732 Aftercare following explantation of hip joint prosthesis: Secondary | ICD-10-CM | POA: Diagnosis not present

## 2016-11-02 DIAGNOSIS — R2689 Other abnormalities of gait and mobility: Secondary | ICD-10-CM | POA: Diagnosis not present

## 2016-11-02 DIAGNOSIS — M62561 Muscle wasting and atrophy, not elsewhere classified, right lower leg: Secondary | ICD-10-CM | POA: Diagnosis not present

## 2016-11-02 DIAGNOSIS — R278 Other lack of coordination: Secondary | ICD-10-CM | POA: Diagnosis not present

## 2016-11-02 DIAGNOSIS — R2681 Unsteadiness on feet: Secondary | ICD-10-CM | POA: Diagnosis not present

## 2016-11-02 DIAGNOSIS — M62562 Muscle wasting and atrophy, not elsewhere classified, left lower leg: Secondary | ICD-10-CM | POA: Diagnosis not present

## 2016-11-05 DIAGNOSIS — Z4732 Aftercare following explantation of hip joint prosthesis: Secondary | ICD-10-CM | POA: Diagnosis not present

## 2016-11-05 DIAGNOSIS — R278 Other lack of coordination: Secondary | ICD-10-CM | POA: Diagnosis not present

## 2016-11-05 DIAGNOSIS — R2681 Unsteadiness on feet: Secondary | ICD-10-CM | POA: Diagnosis not present

## 2016-11-05 DIAGNOSIS — M62562 Muscle wasting and atrophy, not elsewhere classified, left lower leg: Secondary | ICD-10-CM | POA: Diagnosis not present

## 2016-11-05 DIAGNOSIS — R2689 Other abnormalities of gait and mobility: Secondary | ICD-10-CM | POA: Diagnosis not present

## 2016-11-05 DIAGNOSIS — M62561 Muscle wasting and atrophy, not elsewhere classified, right lower leg: Secondary | ICD-10-CM | POA: Diagnosis not present

## 2016-11-07 DIAGNOSIS — M62562 Muscle wasting and atrophy, not elsewhere classified, left lower leg: Secondary | ICD-10-CM | POA: Diagnosis not present

## 2016-11-07 DIAGNOSIS — R2681 Unsteadiness on feet: Secondary | ICD-10-CM | POA: Diagnosis not present

## 2016-11-07 DIAGNOSIS — R2689 Other abnormalities of gait and mobility: Secondary | ICD-10-CM | POA: Diagnosis not present

## 2016-11-07 DIAGNOSIS — R278 Other lack of coordination: Secondary | ICD-10-CM | POA: Diagnosis not present

## 2016-11-07 DIAGNOSIS — M62561 Muscle wasting and atrophy, not elsewhere classified, right lower leg: Secondary | ICD-10-CM | POA: Diagnosis not present

## 2016-11-07 DIAGNOSIS — Z4732 Aftercare following explantation of hip joint prosthesis: Secondary | ICD-10-CM | POA: Diagnosis not present

## 2016-11-08 DIAGNOSIS — M62561 Muscle wasting and atrophy, not elsewhere classified, right lower leg: Secondary | ICD-10-CM | POA: Diagnosis not present

## 2016-11-08 DIAGNOSIS — Z4732 Aftercare following explantation of hip joint prosthesis: Secondary | ICD-10-CM | POA: Diagnosis not present

## 2016-11-08 DIAGNOSIS — R2689 Other abnormalities of gait and mobility: Secondary | ICD-10-CM | POA: Diagnosis not present

## 2016-11-08 DIAGNOSIS — M62562 Muscle wasting and atrophy, not elsewhere classified, left lower leg: Secondary | ICD-10-CM | POA: Diagnosis not present

## 2016-11-08 DIAGNOSIS — R2681 Unsteadiness on feet: Secondary | ICD-10-CM | POA: Diagnosis not present

## 2016-11-08 DIAGNOSIS — R278 Other lack of coordination: Secondary | ICD-10-CM | POA: Diagnosis not present

## 2016-11-13 ENCOUNTER — Telehealth: Payer: Self-pay

## 2016-11-13 DIAGNOSIS — R2689 Other abnormalities of gait and mobility: Secondary | ICD-10-CM | POA: Diagnosis not present

## 2016-11-13 DIAGNOSIS — R278 Other lack of coordination: Secondary | ICD-10-CM | POA: Diagnosis not present

## 2016-11-13 DIAGNOSIS — Z4732 Aftercare following explantation of hip joint prosthesis: Secondary | ICD-10-CM | POA: Diagnosis not present

## 2016-11-13 DIAGNOSIS — Z96652 Presence of left artificial knee joint: Secondary | ICD-10-CM | POA: Diagnosis not present

## 2016-11-13 DIAGNOSIS — M62561 Muscle wasting and atrophy, not elsewhere classified, right lower leg: Secondary | ICD-10-CM | POA: Diagnosis not present

## 2016-11-13 DIAGNOSIS — M62562 Muscle wasting and atrophy, not elsewhere classified, left lower leg: Secondary | ICD-10-CM | POA: Diagnosis not present

## 2016-11-13 DIAGNOSIS — M1612 Unilateral primary osteoarthritis, left hip: Secondary | ICD-10-CM | POA: Diagnosis not present

## 2016-11-13 DIAGNOSIS — R2681 Unsteadiness on feet: Secondary | ICD-10-CM | POA: Diagnosis not present

## 2016-11-13 DIAGNOSIS — Z471 Aftercare following joint replacement surgery: Secondary | ICD-10-CM | POA: Diagnosis not present

## 2016-11-13 NOTE — Telephone Encounter (Signed)
Patient called c/o bilateral leg swelling since surgery in March 2018 (Left Hip Replacement). Patient seen Ortho today and was told to follow-up with Hollace Kinnier L, DO. Patient c/o pain associated with swelling off/on, slight redness, and tightness. Patient denies warmth, and no drainage. Patient on all medication as listed on medication list, no new medications.  Side notes: Patient states she had 2 dopplers (neg)  Please advise

## 2016-11-13 NOTE — Telephone Encounter (Signed)
Pt also has refused use of compression hose.  I am not sure if she is properly elevating her feet at rest at home and avoiding adding salt to food (and avoiding foods already high in sodium like canned and packaged frozen meals.  Can you check on those things?  She had vein stripping in the past and has chronic venous insufficiency.

## 2016-11-14 MED ORDER — FUROSEMIDE 40 MG PO TABS: 40.0000 mg | ORAL_TABLET | Freq: Every day | ORAL | 0 refills | Status: DC

## 2016-11-14 NOTE — Telephone Encounter (Signed)
Patient notified and agreed. Medication faxed to pharmacy. Confirmed appointment with Patient.

## 2016-11-14 NOTE — Telephone Encounter (Signed)
Patient stated that she does not want to wear compression hoses. She stated that she is elevating her feet and legs at rest and she is watching her salt intake. Patient stated that she wants a diuretic called to her pharmacy to help with the swelling. Please Advise.

## 2016-11-14 NOTE — Telephone Encounter (Signed)
She is already on hctz in her one blood pressure pill.  As we discussed in rehab, I do not recommend she take lasix along with hctz.  We can send in just 3 tablets of lasix 40mg  to help get her fluid down temporarily, but will make adjustments to her regimen at her next appt so we can keep the edema under control.

## 2016-11-22 DIAGNOSIS — M62561 Muscle wasting and atrophy, not elsewhere classified, right lower leg: Secondary | ICD-10-CM | POA: Diagnosis not present

## 2016-11-22 DIAGNOSIS — R278 Other lack of coordination: Secondary | ICD-10-CM | POA: Diagnosis not present

## 2016-11-22 DIAGNOSIS — R2689 Other abnormalities of gait and mobility: Secondary | ICD-10-CM | POA: Diagnosis not present

## 2016-11-22 DIAGNOSIS — M62562 Muscle wasting and atrophy, not elsewhere classified, left lower leg: Secondary | ICD-10-CM | POA: Diagnosis not present

## 2016-11-22 DIAGNOSIS — Z4732 Aftercare following explantation of hip joint prosthesis: Secondary | ICD-10-CM | POA: Diagnosis not present

## 2016-11-22 DIAGNOSIS — R2681 Unsteadiness on feet: Secondary | ICD-10-CM | POA: Diagnosis not present

## 2016-12-17 DIAGNOSIS — L821 Other seborrheic keratosis: Secondary | ICD-10-CM | POA: Diagnosis not present

## 2016-12-17 DIAGNOSIS — L570 Actinic keratosis: Secondary | ICD-10-CM | POA: Diagnosis not present

## 2016-12-17 DIAGNOSIS — I872 Venous insufficiency (chronic) (peripheral): Secondary | ICD-10-CM | POA: Diagnosis not present

## 2016-12-17 DIAGNOSIS — I8312 Varicose veins of left lower extremity with inflammation: Secondary | ICD-10-CM | POA: Diagnosis not present

## 2016-12-17 DIAGNOSIS — I8311 Varicose veins of right lower extremity with inflammation: Secondary | ICD-10-CM | POA: Diagnosis not present

## 2017-01-01 ENCOUNTER — Non-Acute Institutional Stay: Payer: Medicare Other

## 2017-01-01 VITALS — BP 122/70 | HR 73 | Temp 97.5°F | Ht 67.0 in | Wt 182.0 lb

## 2017-01-01 DIAGNOSIS — Z0000 Encounter for general adult medical examination without abnormal findings: Secondary | ICD-10-CM

## 2017-01-01 DIAGNOSIS — Z23 Encounter for immunization: Secondary | ICD-10-CM | POA: Diagnosis not present

## 2017-01-01 MED ORDER — ZOSTER VAC RECOMB ADJUVANTED 50 MCG/0.5ML IM SUSR: 0.5000 mL | Freq: Once | INTRAMUSCULAR | 0 refills | Status: AC

## 2017-01-01 NOTE — Progress Notes (Signed)
Subjective:   Amber Bryant is a 83 y.o. female who presents for Medicare Annual (Subsequent) preventive examination at [facility]  Last AWV: 04/16/2016       Objective:     Vitals: BP 122/70 (BP Location: Left Arm, Patient Position: Sitting)   Pulse 73   Temp (!) 97.5 F (36.4 C) (Oral)   Ht 5' 7"  (1.702 m)   Wt 182 lb (82.6 kg)   SpO2 97%   BMI 28.51 kg/m   Body mass index is 28.51 kg/m.   Tobacco History  Smoking Status  . Never Smoker  Smokeless Tobacco  . Never Used     Counseling given: Not Answered   Past Medical History:  Diagnosis Date  . Arthritis   . Closed left femoral fracture ([place]) 2012   s/p ORIF, Dr. Tamera Punt  . Closed left radial fracture 2012   s/p reduction  . Complication of anesthesia 08/14/2016   not able to think clear for a long time afterwards  . Essential hypertension   . Hyperlipidemia   . Hypothyroidism    Past Surgical History:  Procedure Laterality Date  . ABDOMINAL HYSTERECTOMY    . CATARACT EXTRACTION W/ INTRAOCULAR LENS  IMPLANT, BILATERAL Bilateral 2014  . ORIF FEMUR FRACTURE Left 04/08/2011   Dr. Tamera Punt  . TOTAL HIP ARTHROPLASTY Left 08/20/2016   Procedure: TOTAL HIP ARTHROPLASTY POSTERIOR REMOVE [place] NAIL;  Surgeon: Frederik Pear, MD;  Location: [facility];  Service: Orthopedics;  Laterality: Left;   Family History  Problem Relation Age of Onset  . Cancer Mother   . Heart disease Father    History  Sexual Activity  . Sexual activity: No    Outpatient Encounter Prescriptions as of 01/01/2017  Medication Sig  . aspirin EC 81 MG tablet Take 81 mg by mouth daily.  . Cholecalciferol 2000 units CAPS Take 1 capsule (2,000 Units total) by mouth daily.  . furosemide (LASIX) 40 MG tablet Take 1 tablet (40 mg total) by mouth daily.  Marland Kitchen levothyroxine (SYNTHROID, LEVOTHROID) 25 MCG tablet Take 1 tablet (25 mcg total) by mouth daily before breakfast.  . lovastatin (MEVACOR) 20 MG tablet Take 1 tablet  (20 mg total) by mouth at bedtime.  . potassium chloride SA (K-DUR,KLOR-CON) 20 MEQ tablet Take 20 mEq by mouth daily.  Marland Kitchen triamterene-hydrochlorothiazide (MAXZIDE-25) 37.5-25 MG tablet Take 1 tablet by mouth daily.  Marland Kitchen Zoster Vac Recomb Adjuvanted [facility]) injection Inject 0.5 mLs into the muscle once.  . [DISCONTINUED] Zoster Vac Recomb Adjuvanted (SHINGRIX) injection Inject 0.5 mLs into the muscle once.  Marland Kitchen acetaminophen (TYLENOL) 500 MG tablet Take 500 mg by mouth 3 (three) times daily. As needed at bedtime   No facility-administered encounter medications on file as of 01/01/2017.     Activities of Daily Living In your present state of health, do you have any difficulty performing the following activities: 01/01/2017 08/22/2016  Hearing? N N  Vision? N N  Difficulty concentrating or making decisions? N N  Walking or climbing stairs? Y Y  Dressing or bathing? N Y  Doing errands, shopping? Tempie Donning  Preparing Food and eating ? N -  Using the Toilet? N -  In the past six months, have you accidently leaked urine? N -  Do you have problems with loss of bowel control? N -  Managing your Medications? N -  Managing your Finances? N -  Housekeeping or managing your Housekeeping? N -  Some recent data might be hidden  Patient Care Team: Gayland Curry, DO as PCP - General (Geriatric Medicine) Sharyne Peach, MD as Consulting Physician (Ophthalmology) Tania Ade, MD as Consulting Physician (Orthopedic Surgery)    Assessment:     Exercise Activities and Dietary recommendations Current Exercise Habits: Home exercise routine, Type of exercise: Other - see comments;walking (swimming), Time (Minutes): 40, Frequency (Times/Week): 7, Weekly Exercise (Minutes/Week): 280, Intensity: Mild, Exercise limited by: None identified  Goals    . Increase water and decrease salt          Pt will increase awareness of salt and water intake       Fall Risk Fall Risk  01/01/2017 10/10/2016 06/27/2016  04/18/2016  Falls in the past year? No No No No   Depression Screen PHQ 2/9 Scores 01/01/2017 10/10/2016 06/27/2016 04/18/2016  PHQ - 2 Score 0 0 0 0     Cognitive Function MMSE - Mini Mental State Exam 01/01/2017  Orientation to time 5  Orientation to Place 5  Registration 3  Attention/ Calculation 5  Recall 3  Language- name 2 objects 2  Language- repeat 1  Language- follow 3 step command 3  Language- read & follow direction 1  Write a sentence 1  Copy design 1  Total score 30        Immunization History  Administered Date(s) Administered  . H1N1 05/27/2008  . Influenza, High Dose Seasonal PF 03/23/2016  . Influenza-Unspecified 03/18/2016   Screening Tests Health Maintenance  Topic Date Due  . INFLUENZA VACCINE  01/16/2017  . TETANUS/TDAP  04/02/2022  . DEXA SCAN  Completed  . PNA vac Low Risk Adult  Completed      Plan:    I have personally reviewed and addressed the Medicare Annual Wellness questionnaire and have noted the following in the patient's chart:  A. Medical and social history B. Use of alcohol, tobacco or illicit drugs  C. Current medications and supplements D. Functional ability and status E.  Nutritional status F.  Physical activity G. Advance directives H. List of other physicians I.  Hospitalizations, surgeries, and ER visits in previous 12 months J.  [place] to include hearing, vision, cognitive, depression L. Referrals and appointments - none  In addition, I have reviewed and discussed with patient certain preventive protocols, quality metrics, and best practice recommendations. A written personalized care plan for preventive services as well as general preventive health recommendations were provided to patient.  See attached scanned questionnaire for additional information.   Signed,   Rich Reining, RN Nurse Health Advisor   Quick Notes   Health Maintenance: Shingles vaccine ordered.     Abnormal Screen: MMSE  30/30. Passed clock drawing     Patient Concerns: Wondering if she should see a dietician.     Nurse Concerns: Swelling in legs. Pt doesn't know which medications she is taking/their purposes.

## 2017-01-01 NOTE — Patient Instructions (Addendum)
Amber Bryant , Thank you for taking time to come for your Medicare Wellness Visit. I appreciate your ongoing commitment to your health goals. Please review the following plan we discussed and let me know if I can assist you in the future.   Screening recommendations/referrals: Colonoscopy up to date, pt over age 81 Mammogram up to date, pt over age 36 Bone Density up to date Recommended yearly ophthalmology/optometry visit for glaucoma screening and checkup Recommended yearly dental visit for hygiene and checkup  Vaccinations: Influenza vaccine due this upcoming fall season Pneumococcal vaccine up to date Tdap vaccine up to date. Due 04/02/2022 Shingles vaccine prescription sent to [facility]  Advanced directives: In Chart  Conditions/risks identified: None  Next appointment: 01/09/17 Dr. Mariea Clonts @ 1:30pm    Preventive Care 65 Years and Older, Female Preventive care refers to lifestyle choices and visits with your health care provider that can promote health and wellness. What does preventive care include?  A yearly physical exam. This is also called an annual well check.  Dental exams once or twice a year.  Routine eye exams. Ask your health care provider how often you should have your eyes checked.  Personal lifestyle choices, including:  Daily care of your teeth and gums.  Regular physical activity.  Eating a healthy diet.  Avoiding tobacco and drug use.  Limiting alcohol use.  Practicing safe sex.  Taking low-dose aspirin every day.  Taking vitamin and mineral supplements as recommended by your health care provider. What happens during an annual well check? The services and screenings done by your health care provider during your annual well check will depend on your age, overall health, lifestyle risk factors, and family history of disease. Counseling  Your health care provider may ask you questions about your:  Alcohol use.  Tobacco use.  Drug  use.  Emotional well-being.  Home and relationship well-being.  Sexual activity.  Eating habits.  History of falls.  Memory and ability to understand (cognition).  Work and work Statistician.  Reproductive health. Screening  You may have the following tests or measurements:  Height, weight, and BMI.  Blood pressure.  Lipid and cholesterol levels. These may be checked every 5 years, or more frequently if you are over 22 years old.  Skin check.  Lung cancer screening. You may have this screening every year starting at age 53 if you have a 30-pack-year history of smoking and currently smoke or have quit within the past 15 years.  Fecal occult blood test (FOBT) of the stool. You may have this test every year starting at age 38.  Flexible sigmoidoscopy or colonoscopy. You may have a sigmoidoscopy every 5 years or a colonoscopy every 10 years starting at age 66.  Hepatitis C blood test.  Hepatitis B blood test.  Sexually transmitted disease (STD) testing.  Diabetes screening. This is done by checking your blood sugar (glucose) after you have not eaten for a while (fasting). You may have this done every 1-3 years.  Bone density scan. This is done to screen for osteoporosis. You may have this done starting at age 1.  Mammogram. This may be done every 1-2 years. Talk to your health care provider about how often you should have regular mammograms. Talk with your health care provider about your test results, treatment options, and if necessary, the need for more tests. Vaccines  Your health care provider may recommend certain vaccines, such as:  Influenza vaccine. This is recommended every year.  Tetanus, diphtheria, and  acellular pertussis (Tdap, Td) vaccine. You may need a Td booster every 10 years.  Zoster vaccine. You may need this after age 61.  Pneumococcal 13-valent conjugate (PCV13) vaccine. One dose is recommended after age 25.  Pneumococcal polysaccharide  (PPSV23) vaccine. One dose is recommended after age 71. Talk to your health care provider about which screenings and vaccines you need and how often you need them. This information is not intended to replace advice given to you by your health care provider. Make sure you discuss any questions you have with your health care provider. Document Released: 07/01/2015 Document Revised: 02/22/2016 Document Reviewed: 04/05/2015 Elsevier Interactive Patient Education  2017 [place] Prevention in the Home Falls can cause injuries. They can happen to people of all ages. There are many things you can do to make your home safe and to help prevent falls. What can I do on the outside of my home?  Regularly fix the edges of walkways and driveways and fix any cracks.  Remove anything that might make you trip as you walk through a door, such as a raised step or threshold.  Trim any bushes or trees on the path to your home.  Use bright outdoor lighting.  Clear any walking paths of anything that might make someone trip, such as rocks or tools.  Regularly check to see if handrails are loose or broken. Make sure that both sides of any steps have handrails.  Any raised decks and porches should have guardrails on the edges.  Have any leaves, snow, or ice cleared regularly.  Use sand or salt on walking paths during winter.  Clean up any spills in your garage right away. This includes oil or grease spills. What can I do in the bathroom?  Use night lights.  Install grab bars by the toilet and in the tub and shower. Do not use towel bars as grab bars.  Use non-skid mats or decals in the tub or shower.  If you need to sit down in the shower, use a plastic, non-slip stool.  Keep the floor dry. Clean up any water that spills on the floor as soon as it happens.  Remove soap buildup in the tub or shower regularly.  Attach bath mats securely with double-sided non-slip rug tape.  Do not have  throw rugs and other things on the floor that can make you trip. What can I do in the bedroom?  Use night lights.  Make sure that you have a light by your bed that is easy to reach.  Do not use any sheets or blankets that are too big for your bed. They should not hang down onto the floor.  Have a firm chair that has side arms. You can use this for support while you get dressed.  Do not have throw rugs and other things on the floor that can make you trip. What can I do in the kitchen?  Clean up any spills right away.  Avoid walking on wet floors.  Keep items that you use a lot in easy-to-reach places.  If you need to reach something above you, use a strong step stool that has a grab bar.  Keep electrical cords out of the way.  Do not use floor polish or wax that makes floors slippery. If you must use wax, use non-skid floor wax.  Do not have throw rugs and other things on the floor that can make you trip. What can I do with my stairs?  Do not leave any items on the stairs.  Make sure that there are handrails on both sides of the stairs and use them. Fix handrails that are broken or loose. Make sure that handrails are as long as the stairways.  Check any carpeting to make sure that it is firmly attached to the stairs. Fix any carpet that is loose or worn.  Avoid having throw rugs at the top or bottom of the stairs. If you do have throw rugs, attach them to the floor with carpet tape.  Make sure that you have a light switch at the top of the stairs and the bottom of the stairs. If you do not have them, ask someone to add them for you. What else can I do to help prevent falls?  Wear shoes that:  Do not have high heels.  Have rubber bottoms.  Are comfortable and fit you well.  Are closed at the toe. Do not wear sandals.  If you use a stepladder:  Make sure that it is fully opened. Do not climb a closed stepladder.  Make sure that both sides of the stepladder are  locked into place.  Ask someone to hold it for you, if possible.  Clearly mark and make sure that you can see:  Any grab bars or handrails.  First and last steps.  Where the edge of each step is.  Use tools that help you move around (mobility aids) if they are needed. These include:  Canes.  Walkers.  Scooters.  Crutches.  Turn on the lights when you go into a dark area. Replace any light bulbs as soon as they burn out.  Set up your furniture so you have a clear path. Avoid moving your furniture around.  If any of your floors are uneven, fix them.  If there are any pets around you, be aware of where they are.  Review your medicines with your doctor. Some medicines can make you feel dizzy. This can increase your chance of falling. Ask your doctor what other things that you can do to help prevent falls. This information is not intended to replace advice given to you by your health care provider. Make sure you discuss any questions you have with your health care provider. Document Released: 03/31/2009 Document Revised: 11/10/2015 Document Reviewed: 07/09/2014 Elsevier Interactive Patient Education  2017 [employer].

## 2017-01-09 ENCOUNTER — Non-Acute Institutional Stay: Payer: Medicare Other | Admitting: Internal Medicine

## 2017-01-09 ENCOUNTER — Encounter: Payer: Self-pay | Admitting: Internal Medicine

## 2017-01-09 VITALS — BP 122/70 | HR 76 | Temp 97.9°F | Wt 180.0 lb

## 2017-01-09 DIAGNOSIS — I872 Venous insufficiency (chronic) (peripheral): Secondary | ICD-10-CM | POA: Diagnosis not present

## 2017-01-09 DIAGNOSIS — M2170 Unequal limb length (acquired), unspecified site: Secondary | ICD-10-CM | POA: Diagnosis not present

## 2017-01-09 DIAGNOSIS — Z96642 Presence of left artificial hip joint: Secondary | ICD-10-CM

## 2017-01-09 DIAGNOSIS — I10 Essential (primary) hypertension: Secondary | ICD-10-CM | POA: Diagnosis not present

## 2017-01-09 DIAGNOSIS — E785 Hyperlipidemia, unspecified: Secondary | ICD-10-CM | POA: Diagnosis not present

## 2017-01-09 DIAGNOSIS — E039 Hypothyroidism, unspecified: Secondary | ICD-10-CM | POA: Diagnosis not present

## 2017-01-09 MED ORDER — DOXYCYCLINE HYCLATE 100 MG PO TABS: 100.0000 mg | ORAL_TABLET | Freq: Two times a day (BID) | ORAL | 0 refills | Status: DC

## 2017-01-09 NOTE — Progress Notes (Signed)
Location:  Occupational psychologist of Service:  Clinic (12)  Provider: Rakhi Romagnoli L. Mariea Clonts, D.O., C.M.D.  Code Status: DNR Goals of Care:  Advanced Directives 01/01/2017  Does Patient Have a Medical Advance Directive? Yes  Type of Paramedic of [place];Living will  Does patient want to make changes to medical advance directive? No - Patient declined  Copy of [place] in Chart? Yes   Chief Complaint  Patient presents with  . Medical Management of Chronic Issues    31mth follow-up    HPI: Patient is a 83 y.o. female seen today for medical management of chronic diseases.    It's now been 4 months since her left hip replacement surgery.  She is still walking with a walker.  She has a prominent leg length discrepancy.  She has a pain that keeps coming back.  She'd had a rod in there and had a plate put in in its place.  Down her anterior thigh from her groin.  Took an aleve today.  Usually waits it out.  Still having difficulty with walking up stairs.  Has a fear of falling w/o the walker and doesn't feel ready for a cane.  She continued with some PT at her home for another month after she left [facility].    Swimming daily.  Does not bother her there.    Still has her chronic edema of the legs.  Does have ankles again now.  Does not want compression hose.  No longer has pain in the feet or ankles.  Has difficulty with width of calf to get them up and over and has sensitive skin.  She even considered the zipper variety but they didn't have an extra large.  She does elevate her feet at night.    Is down to only one dog now and missed the other two terribly.  He looks for the others and does enjoy being spoiled.   BP at goal.    Weight still high.  Not eating much, is swimming.  She is somewhat interested in seeing a dietitian, but then has to make the right choices in the dining room.    [facility] on back order--going to  check with [facility].  She reports her husband died from the shingles.  He'd had a stroke and did not feel well--she had noticed the rash.  It had attacked his nervous system and he was in terrible pain and passed way in 2 weeks.    She went to the dentist and was told she was supposed to have taken pills before her appt.    Past Medical History:  Diagnosis Date  . Arthritis   . Closed left femoral fracture ([place]) 2012   s/p ORIF, Dr. Tamera Punt  . Closed left radial fracture 2012   s/p reduction  . Complication of anesthesia 08/14/2016   not able to think clear for a long time afterwards  . Essential hypertension   . Hyperlipidemia   . Hypothyroidism     Past Surgical History:  Procedure Laterality Date  . ABDOMINAL HYSTERECTOMY    . CATARACT EXTRACTION W/ INTRAOCULAR LENS  IMPLANT, BILATERAL Bilateral 2014  . ORIF FEMUR FRACTURE Left 04/08/2011   Dr. Tamera Punt  . TOTAL HIP ARTHROPLASTY Left 08/20/2016   Procedure: TOTAL HIP ARTHROPLASTY POSTERIOR REMOVE [place] NAIL;  Surgeon: Frederik Pear, MD;  Location: [place];  Service: Orthopedics;  Laterality: Left;    Allergies  Allergen Reactions  .  Amoxicillin     UNSPECIFIED REACTION   Has patient had a PCN reaction causing immediate rash, facial/tongue/throat swelling, SOB or lightheadedness with hypotension: Unknown Has patient had a PCN reaction causing severe rash involving mucus membranes or skin necrosis: Unknown Has patient had a PCN reaction that required hospitalization No Has patient had a PCN reaction occurring within the last 10 years: No If all of the above answers are "NO", then may proceed with Cephalosporin use.     Allergies as of 01/09/2017      Reactions   Amoxicillin    UNSPECIFIED REACTION  Has patient had a PCN reaction causing immediate rash, facial/tongue/throat swelling, SOB or lightheadedness with hypotension: Unknown Has patient had a PCN reaction causing severe rash involving mucus membranes or skin necrosis:  Unknown Has patient had a PCN reaction that required hospitalization No Has patient had a PCN reaction occurring within the last 10 years: No If all of the above answers are "NO", then may proceed with Cephalosporin use.      Medication List       Accurate as of 01/09/17  1:36 PM. Always use your most recent med list.          acetaminophen 500 MG tablet Commonly known as:  TYLENOL Take 500 mg by mouth 3 (three) times daily. As needed at bedtime   aspirin EC 81 MG tablet Take 81 mg by mouth daily.   Cholecalciferol 2000 units Caps Take 1 capsule (2,000 Units total) by mouth daily.   furosemide 40 MG tablet Commonly known as:  LASIX Take 1 tablet (40 mg total) by mouth daily.   levothyroxine 25 MCG tablet Commonly known as:  SYNTHROID, LEVOTHROID Take 1 tablet (25 mcg total) by mouth daily before breakfast.   lovastatin 20 MG tablet Commonly known as:  MEVACOR Take 1 tablet (20 mg total) by mouth at bedtime.   potassium chloride SA 20 MEQ tablet Commonly known as:  K-DUR,KLOR-CON Take 20 mEq by mouth daily.   triamterene-hydrochlorothiazide 37.5-25 MG tablet Commonly known as:  MAXZIDE-25 Take 1 tablet by mouth daily.       Review of Systems:  Review of Systems  Constitutional: Negative for chills, fever and malaise/fatigue.  HENT: Positive for hearing loss. Negative for congestion.   Eyes: Negative for blurred vision.  Respiratory: Negative for cough and shortness of breath.   Cardiovascular: Positive for leg swelling. Negative for chest pain, palpitations, orthopnea and PND.  Gastrointestinal: Negative for abdominal pain, blood in stool, constipation and melena.  Genitourinary: Negative for dysuria.  Musculoskeletal: Negative for falls and myalgias.       Left thigh pain  Neurological: Negative for dizziness, loss of consciousness and weakness.  Endo/Heme/Allergies: Does not bruise/bleed easily.  Psychiatric/Behavioral: Positive for memory loss. Negative  for depression. The patient is nervous/anxious.     Health Maintenance  Topic Date Due  . INFLUENZA VACCINE  01/16/2017  . TETANUS/TDAP  04/02/2022  . DEXA SCAN  Completed  . PNA vac Low Risk Adult  Completed    Physical Exam: Vitals:   01/09/17 1329  BP: 122/70  Pulse: 76  Temp: 97.9 F (36.6 C)  TempSrc: Oral  SpO2: 98%  Weight: 180 lb (81.6 kg)   Body mass index is 28.19 kg/m. Physical Exam  Constitutional: She is oriented to person, place, and time. She appears well-developed and well-nourished. No distress.  Cardiovascular: Normal rate, regular rhythm, normal heart sounds and intact distal pulses.   Bilateral LE edema with thickened  skin, chronic erythema  Pulmonary/Chest: Effort normal and breath sounds normal. No respiratory distress.  Abdominal: Bowel sounds are normal.  Musculoskeletal: Normal range of motion.  Neurological: She is alert and oriented to person, place, and time.  Skin: Skin is warm and dry. Capillary refill takes less than 2 seconds.  Psychiatric: She has a normal mood and affect.    Labs reviewed: Basic Metabolic Panel:  Recent Labs  04/16/16 08/14/16 1057 08/29/16 0208 10/18/16  NA 143 141 140 142  K 4.6 4.1 4.1 4.1  CL  --  105  --   --   CO2  --  28  --   --   GLUCOSE  --  93  --   --   BUN 21 21* 19 18  CREATININE 0.9 0.87 0.8 0.8  CALCIUM  --  9.8  --   --   TSH 2.50  --   --  3.77   Liver Function Tests:  Recent Labs  04/16/16  AST 16  ALT 9  ALKPHOS 91   No results for input(s): LIPASE, AMYLASE in the last 8760 hours. No results for input(s): AMMONIA in the last 8760 hours. CBC:  Recent Labs  08/14/16 1057 08/21/16 0619 08/22/16 0635 10/18/16  WBC 6.8 8.4 11.8* 6.1  NEUTROABS 4.4  --   --   --   HGB 12.1 9.4* 9.5* 11.5*  HCT 37.5 29.4* 29.6* 37  MCV 94.9 94.5 94.6  --   PLT 255 190 186 277   Lipid Panel:  Recent Labs  04/16/16  CHOL 191  HDL 75*  LDLCALC 104  TRIG 61   Lab Results  Component Value  Date   HGBA1C 5.7 (H) 04/10/2011    Assessment/Plan 1. Acquired leg length discrepancy -discussed that this may be playing a role in her pain in her left thigh, she had not noticed that the left leg is shorter than the right and she is walking with a scoliotic curve when watched with her walker from behind  2. Chronic venous insufficiency -edema bilaterally has been ongoing, is on a diuretic in her bp medication so not safe to add lasix which will cause electrolyte depletion -needs to find compression hose that fit and elevate her feet at rest, avoid high sodium items  3. Essential hypertension -bp at goal with current therapy, cont same meds and monitor  4. Hypothyroidism, unspecified type -last tsh wnl, cont levothyroxine  5. Hyperlipidemia, unspecified hyperlipidemia type -is trying to do better with her diet to lose weight and lower bad cholesterol, cont mevacor  6. History of left hip replacement - went to the dentist and did not have abx to take perioperatively and it's been less than 6 mos since her surgery so needs peri-procedure abx--is allergic to amoxicillin so will give doxy - doxycycline (VIBRA-TABS) 100 MG tablet; Take 1 tablet (100 mg total) by mouth 2 (two) times daily. Take before any dental work for 6 mos after hip replacement surgery  Dispense: 20 tablet; Refill: 0  Labs/tests ordered:  No new Next appt:  05/15/2017 med mgt   Erhard Senske L. Kadar Chance, D.O. [facility] [address], [place] [address] Cell Phone (Mon-Fri 8am-5pm):  [phone] On Call:  [phone] & follow prompts after 5pm & weekends Office Phone:  [phone] Office Fax:  [phone]

## 2017-02-13 DIAGNOSIS — Z96652 Presence of left artificial knee joint: Secondary | ICD-10-CM | POA: Diagnosis not present

## 2017-02-13 DIAGNOSIS — Z09 Encounter for follow-up examination after completed treatment for conditions other than malignant neoplasm: Secondary | ICD-10-CM | POA: Diagnosis not present

## 2017-02-13 DIAGNOSIS — M25552 Pain in left hip: Secondary | ICD-10-CM | POA: Diagnosis not present

## 2017-04-03 DIAGNOSIS — Z23 Encounter for immunization: Secondary | ICD-10-CM | POA: Diagnosis not present

## 2017-04-10 DIAGNOSIS — H26491 Other secondary cataract, right eye: Secondary | ICD-10-CM | POA: Diagnosis not present

## 2017-05-15 ENCOUNTER — Non-Acute Institutional Stay: Payer: Medicare Other | Admitting: Internal Medicine

## 2017-05-15 ENCOUNTER — Encounter: Payer: Self-pay | Admitting: Internal Medicine

## 2017-05-15 VITALS — BP 120/80 | HR 73 | Temp 97.5°F | Wt 171.0 lb

## 2017-05-15 DIAGNOSIS — K5903 Drug induced constipation: Secondary | ICD-10-CM

## 2017-05-15 DIAGNOSIS — E876 Hypokalemia: Secondary | ICD-10-CM

## 2017-05-15 DIAGNOSIS — I10 Essential (primary) hypertension: Secondary | ICD-10-CM

## 2017-05-15 DIAGNOSIS — H259 Unspecified age-related cataract: Secondary | ICD-10-CM | POA: Diagnosis not present

## 2017-05-15 DIAGNOSIS — E559 Vitamin D deficiency, unspecified: Secondary | ICD-10-CM | POA: Diagnosis not present

## 2017-05-15 DIAGNOSIS — E039 Hypothyroidism, unspecified: Secondary | ICD-10-CM | POA: Diagnosis not present

## 2017-05-15 DIAGNOSIS — E785 Hyperlipidemia, unspecified: Secondary | ICD-10-CM

## 2017-05-15 DIAGNOSIS — I872 Venous insufficiency (chronic) (peripheral): Secondary | ICD-10-CM

## 2017-05-15 NOTE — Progress Notes (Signed)
Location:  Occupational psychologist of Service:  Clinic (12)  Provider: Kaytlin Burklow L. Mariea Clonts, D.O., C.M.D.  Code Status: DNR Goals of Care:  Advanced Directives 05/15/2017  Does Patient Have a Medical Advance Directive? Yes  Type of Advance Directive [place]  Does patient want to make changes to medical advance directive? -  Copy of [place] in Chart? Yes   Chief Complaint  Patient presents with  . Medical Management of Chronic Issues    48mth follow-up    HPI: Patient is a 83 y.o. female seen today for medical management of chronic diseases.    Patient did not know which meds she was and was not taking.  She was not taking potassium and wasn't sure if she was taking lasix.  Says she'll call back and let us know.  Did not bring pills along.   Says she's doing really good.  She has started baking cornbread.  She is trying to eat less floury bread--she brought me a mini loaf.  She's 165 lbs at home first thing in am.  Goal is 156.  She's slowly losing.  She had been up to 182 lbs.    Finally without the walker most of the time.  Will use a cart to lean on when shopping.  Does take it the long trek to the dining room.  Then leaves it outside there.  No falls.  Chronic venous insufficiency:  Swelling is gone at last.  Had to elevate feet.  No pain.  She is unsure if she's taking her diuretic or her bp pill with hctz in it.  Only knows colors of her pills and says there's no round white one or bowtie shaped blue one.    BP at goal.  Unclear if on her medicine or not.  She is swimming each day at 6am.  Has the pool to herself and does laps for 30 mins.  Takes her dog for a walk in the evening to the gym area and she uses the nustep and he watches her.  Then they walk back.  This pool is not long enough with her backstroke.  The pool at the club is being fixed which is chlorine--she used to go there.    [email or web address] is her  email--told me and I added to demographics--advised to communicate using mychart.    She has a new book out--her science book--paperback, e-book and audiobook which she wants to get to [employer].    Needs right cataract surgery.  Has plans with Dr. Delman Cheadle for this.  Vitamin D deficiency and osteoporosis:  Is to be on vitamin D and says she is taking it.  Hypokalemia:  Was with diuretics, but not taking potassium now.  Hyperlipidemia:  Working on diet and exercise program.  Is on mevacor.  Very active for 35 almost 83 yo.  Past Medical History:  Diagnosis Date  . Arthritis   . Closed left femoral fracture ([place]) 2012   s/p ORIF, Dr. Tamera Punt  . Closed left radial fracture 2012   s/p reduction  . Complication of anesthesia 08/14/2016   not able to think clear for a long time afterwards  . Essential hypertension   . Hyperlipidemia   . Hypothyroidism     Past Surgical History:  Procedure Laterality Date  . ABDOMINAL HYSTERECTOMY    . CATARACT EXTRACTION W/ INTRAOCULAR LENS  IMPLANT, BILATERAL Bilateral 2014  . ORIF FEMUR FRACTURE Left 04/08/2011  Dr. Tamera Punt  . TOTAL HIP ARTHROPLASTY Left 08/20/2016   Procedure: TOTAL HIP ARTHROPLASTY POSTERIOR REMOVE [place] NAIL;  Surgeon: Frederik Pear, MD;  Location: [facility];  Service: Orthopedics;  Laterality: Left;    Allergies  Allergen Reactions  . Amoxicillin     UNSPECIFIED REACTION   Has patient had a PCN reaction causing immediate rash, facial/tongue/throat swelling, SOB or lightheadedness with hypotension: Unknown Has patient had a PCN reaction causing severe rash involving mucus membranes or skin necrosis: Unknown Has patient had a PCN reaction that required hospitalization No Has patient had a PCN reaction occurring within the last 10 years: No If all of the above answers are "NO", then may proceed with Cephalosporin use.     Outpatient Encounter Medications as of 05/15/2017  Medication Sig  . aspirin EC 81 MG tablet  Take 81 mg by mouth daily.  . Cholecalciferol 2000 units CAPS Take 1 capsule (2,000 Units total) by mouth daily.  . furosemide (LASIX) 40 MG tablet Take 1 tablet (40 mg total) by mouth daily.  Marland Kitchen levothyroxine (SYNTHROID, LEVOTHROID) 25 MCG tablet Take 1 tablet (25 mcg total) by mouth daily before breakfast.  . lovastatin (MEVACOR) 20 MG tablet Take 1 tablet (20 mg total) by mouth at bedtime.  . triamterene-hydrochlorothiazide (MAXZIDE-25) 37.5-25 MG tablet Take 1 tablet by mouth daily.  . [DISCONTINUED] acetaminophen (TYLENOL) 500 MG tablet Take 500 mg by mouth 3 (three) times daily. As needed at bedtime  . [DISCONTINUED] doxycycline (VIBRA-TABS) 100 MG tablet Take 1 tablet (100 mg total) by mouth 2 (two) times daily. Take before any dental work for 6 mos after hip replacement surgery  . [DISCONTINUED] potassium chloride SA (K-DUR,KLOR-CON) 20 MEQ tablet Take 20 mEq by mouth daily.   No facility-administered encounter medications on file as of 05/15/2017.     Review of Systems:  Review of Systems  Constitutional: Negative for chills, fever and malaise/fatigue.  HENT: Negative for congestion.   Eyes: Positive for blurred vision.       Getting right cataract surgery, also needing magnifying glass now to see thermostat  Respiratory: Negative for cough and shortness of breath.   Cardiovascular: Positive for leg swelling. Negative for chest pain and palpitations.  Gastrointestinal: Negative for abdominal pain, blood in stool, constipation and melena.  Genitourinary: Positive for frequency. Negative for dysuria.       Up 3-4 times at night sometimes  Musculoskeletal: Negative for falls and joint pain.  Skin: Negative for itching and rash.  Neurological: Negative for dizziness, loss of consciousness and weakness.  Endo/Heme/Allergies: Bruises/bleeds easily.  Psychiatric/Behavioral: Positive for memory loss. Negative for depression. The patient is not nervous/anxious and does not have insomnia.       Health Maintenance  Topic Date Due  . INFLUENZA VACCINE  01/16/2017  . TETANUS/TDAP  04/02/2022  . DEXA SCAN  Completed  . PNA vac Low Risk Adult  Completed    Physical Exam: Vitals:   05/15/17 1141  BP: 120/80  Pulse: 73  Temp: (!) 97.5 F (36.4 C)  TempSrc: Oral  SpO2: 97%  Weight: 171 lb (77.6 kg)   Body mass index is 26.78 kg/m. Physical Exam  Constitutional: She is oriented to person, place, and time. She appears well-developed and well-nourished. No distress.  HENT:  Head: Normocephalic and atraumatic.  Cardiovascular: Normal rate, regular rhythm, normal heart sounds and intact distal pulses.  Pulmonary/Chest: Effort normal and breath sounds normal. No respiratory distress.  Abdominal: Bowel sounds are normal.  Musculoskeletal: Normal  range of motion.  "knock-kneed"  Neurological: She is alert and oriented to person, place, and time.  Put poor historian with meds  Skin: Skin is warm and dry. Capillary refill takes less than 2 seconds.  Psychiatric: She has a normal mood and affect. Her behavior is normal.    Labs reviewed: Basic Metabolic Panel: Recent Labs    08/14/16 1057 08/29/16 0208 10/18/16  NA 141 140 142  K 4.1 4.1 4.1  CL 105  --   --   CO2 28  --   --   GLUCOSE 93  --   --   BUN 21* 19 18  CREATININE 0.87 0.8 0.8  CALCIUM 9.8  --   --   TSH  --   --  3.77   Liver Function Tests: No results for input(s): AST, ALT, ALKPHOS, BILITOT, PROT, ALBUMIN in the last 8760 hours. No results for input(s): LIPASE, AMYLASE in the last 8760 hours. No results for input(s): AMMONIA in the last 8760 hours. CBC: Recent Labs    08/14/16 1057 08/21/16 0619 08/22/16 0635 10/18/16  WBC 6.8 8.4 11.8* 6.1  NEUTROABS 4.4  --   --   --   HGB 12.1 9.4* 9.5* 11.5*  HCT 37.5 29.4* 29.6* 37  MCV 94.9 94.5 94.6  --   PLT 255 190 186 277   Lipid Panel: No results for input(s): CHOL, HDL, LDLCALC, TRIG, CHOLHDL, LDLDIRECT in the last 8760 hours. Lab Results   Component Value Date   HGBA1C 5.7 (H) 04/10/2011   Assessment/Plan 1. Essential hypertension -bp at goal, but unclear if taking meds  2. Chronic venous insufficiency -edema much better, but not clear if on diuretics  3. Hypothyroidism, unspecified type -cont levothyroxine, check tsh before next visit  4. Drug-induced constipation -resolved, off pain medicines for a long time now  5. Hyperlipidemia, unspecified hyperlipidemia type -continues on mevacor, f/u flp before next visit  6. Hypokalemia -check bmp before next visit, but pt no longer taking potassium and may or may not be on diuretic  7. Vitamin D deficiency -says she is taking her vitamin D, has osteoporosis also--doing walking and swimming  8. Senile cataract of right eye, unspecified age-related cataract type -for surgery  PT IS TO SEND MYCHART MESSAGE TO LET ME KNOW WHICH MEDS SHE IS ACTUALLY TAKING AND NOT TAKING SO WE CAN UPDATE THE LIST.  SHE MAY HAVE STOPPED SOME ON HER OWN.  Labs/tests ordered:  Cbc, cmp, flp, vitamin D, TSH Next appt:  6 mos for CPE, labs before  Kenai Fluegel L. Seniah Lawrence, D.O. [facility] [address], [place] [address] Cell Phone (Mon-Fri 8am-5pm):  [phone] On Call:  [phone] & follow prompts after 5pm & weekends Office Phone:  [phone] Office Fax:  [phone]

## 2017-05-15 NOTE — Patient Instructions (Addendum)
Please send me a message in [place] with your correct medications that you are actually taking.

## 2017-05-20 DIAGNOSIS — H26491 Other secondary cataract, right eye: Secondary | ICD-10-CM | POA: Diagnosis not present

## 2017-07-17 ENCOUNTER — Other Ambulatory Visit: Payer: Self-pay | Admitting: Internal Medicine

## 2017-08-27 DIAGNOSIS — M25552 Pain in left hip: Secondary | ICD-10-CM | POA: Diagnosis not present

## 2017-08-27 DIAGNOSIS — Z96642 Presence of left artificial hip joint: Secondary | ICD-10-CM | POA: Diagnosis not present

## 2017-09-01 IMAGING — CR DG PORTABLE PELVIS
1 series · 1 of 1 positions shown · non-contrast
Comparison: None.

CLINICAL DATA: Left total hip replacement. Arthritis of the left
hip.

EXAM:
PORTABLE PELVIS 1-2 VIEWS

[AP]
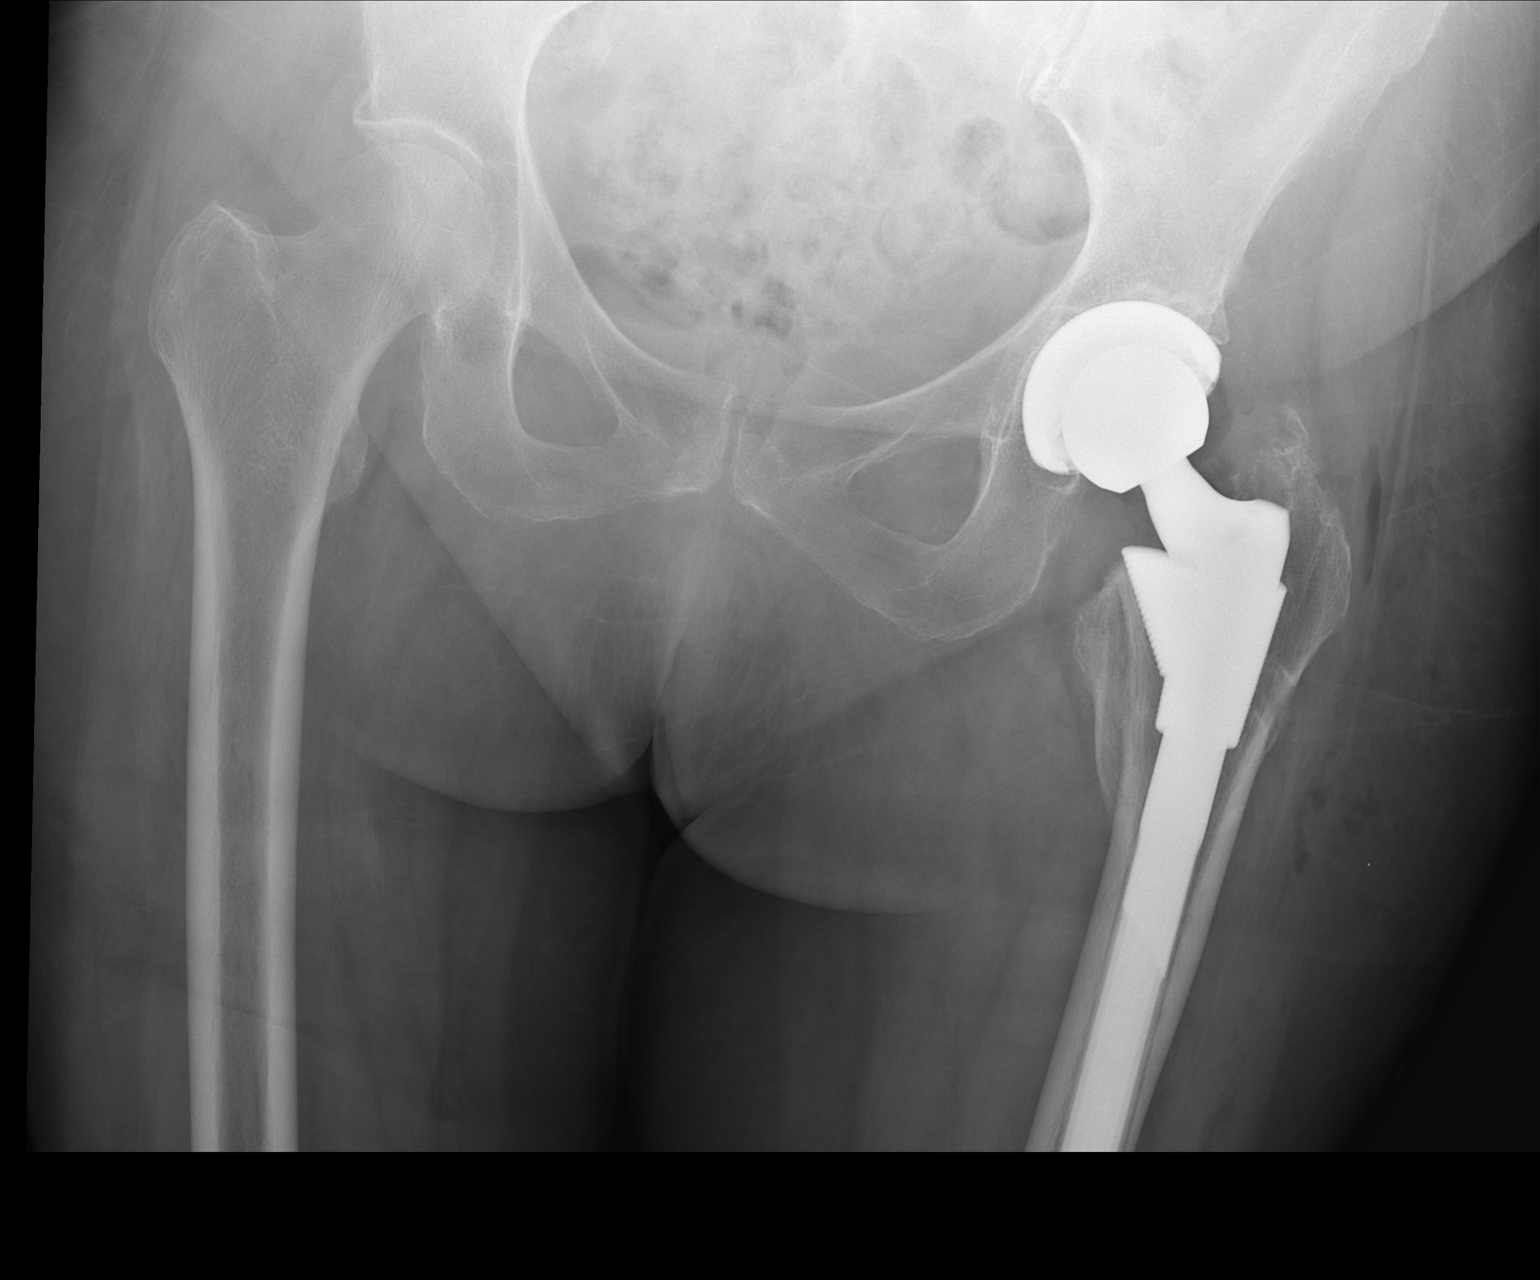

[1 of 1 positions shown; findings below may reference images not displayed]

FINDINGS: Acetabular and femoral components of the total hip prosthesis appear
in excellent position in the AP projection. Old deformity of the
proximal femur secondary to remote intertrochanteric fracture.
Thinning of the lateral cortex of the midshaft of the left femur.
IMPRESSION: Satisfactory appearance of the left hip prosthesis in the AP
projection.

## 2017-10-07 ENCOUNTER — Other Ambulatory Visit: Payer: Self-pay | Admitting: Internal Medicine

## 2017-10-07 NOTE — Telephone Encounter (Signed)
Patient is not taking this medication

## 2017-10-07 NOTE — Telephone Encounter (Signed)
.  left message to have patient return my call, need to know if she's taking this medication

## 2017-11-05 DIAGNOSIS — K5903 Drug induced constipation: Secondary | ICD-10-CM | POA: Diagnosis not present

## 2017-11-05 DIAGNOSIS — I872 Venous insufficiency (chronic) (peripheral): Secondary | ICD-10-CM | POA: Diagnosis not present

## 2017-11-05 DIAGNOSIS — E785 Hyperlipidemia, unspecified: Secondary | ICD-10-CM | POA: Diagnosis not present

## 2017-11-05 DIAGNOSIS — D649 Anemia, unspecified: Secondary | ICD-10-CM | POA: Diagnosis not present

## 2017-11-05 DIAGNOSIS — I10 Essential (primary) hypertension: Secondary | ICD-10-CM | POA: Diagnosis not present

## 2017-11-05 DIAGNOSIS — E039 Hypothyroidism, unspecified: Secondary | ICD-10-CM | POA: Diagnosis not present

## 2017-11-05 DIAGNOSIS — E559 Vitamin D deficiency, unspecified: Secondary | ICD-10-CM | POA: Diagnosis not present

## 2017-11-05 LAB — HEPATIC FUNCTION PANEL
ALT: 8 (ref 7–35)
AST: 18 (ref 13–35)
Alkaline Phosphatase: 98 (ref 25–125)
Bilirubin, Total: 0.4

## 2017-11-05 LAB — CBC AND DIFFERENTIAL
HCT: 39 (ref 36–46)
Hemoglobin: 13.1 (ref 12.0–16.0)
Platelets: 243 (ref 150–399)
WBC: 6.5

## 2017-11-05 LAB — BASIC METABOLIC PANEL
BUN: 23 — AB (ref 4–21)
Creatinine: 0.8 (ref 0.5–1.1)
Glucose: 91
Potassium: 4 (ref 3.4–5.3)
Sodium: 139 (ref 137–147)

## 2017-11-05 LAB — LIPID PANEL
Cholesterol: 215 — AB (ref 0–200)
HDL: 70 (ref 35–70)
LDL Cholesterol: 128
Triglycerides: 82 (ref 40–160)

## 2017-11-05 LAB — VITAMIN D 25 HYDROXY (VIT D DEFICIENCY, FRACTURES): Vit D, 25-Hydroxy: 35.48

## 2017-11-08 ENCOUNTER — Encounter: Payer: Self-pay | Admitting: Internal Medicine

## 2017-11-13 ENCOUNTER — Encounter: Payer: Self-pay | Admitting: Internal Medicine

## 2017-11-13 ENCOUNTER — Non-Acute Institutional Stay: Payer: Medicare Other | Admitting: Internal Medicine

## 2017-11-13 VITALS — BP 120/70 | HR 63 | Temp 97.6°F | Ht 67.0 in | Wt 176.0 lb

## 2017-11-13 DIAGNOSIS — E663 Overweight: Secondary | ICD-10-CM

## 2017-11-13 DIAGNOSIS — M2041 Other hammer toe(s) (acquired), right foot: Secondary | ICD-10-CM | POA: Diagnosis not present

## 2017-11-13 DIAGNOSIS — E039 Hypothyroidism, unspecified: Secondary | ICD-10-CM

## 2017-11-13 DIAGNOSIS — E559 Vitamin D deficiency, unspecified: Secondary | ICD-10-CM | POA: Diagnosis not present

## 2017-11-13 DIAGNOSIS — E785 Hyperlipidemia, unspecified: Secondary | ICD-10-CM | POA: Diagnosis not present

## 2017-11-13 DIAGNOSIS — I872 Venous insufficiency (chronic) (peripheral): Secondary | ICD-10-CM | POA: Diagnosis not present

## 2017-11-13 DIAGNOSIS — M2042 Other hammer toe(s) (acquired), left foot: Secondary | ICD-10-CM | POA: Diagnosis not present

## 2017-11-13 DIAGNOSIS — M810 Age-related osteoporosis without current pathological fracture: Secondary | ICD-10-CM

## 2017-11-13 DIAGNOSIS — Z0000 Encounter for general adult medical examination without abnormal findings: Secondary | ICD-10-CM | POA: Diagnosis not present

## 2017-11-13 DIAGNOSIS — I10 Essential (primary) hypertension: Secondary | ICD-10-CM

## 2017-11-13 NOTE — Progress Notes (Signed)
Provider:  Rexene Edison. Mariea Clonts, D.O., C.M.D. Location:  Occupational psychologist of Service:  Clinic (12)  Previous PCP: Gayland Curry, DO Patient Care Team: Gayland Curry, DO as PCP - General (Geriatric Medicine) Sharyne Peach, MD as Consulting Physician (Ophthalmology) Tania Ade, MD as Consulting Physician (Orthopedic Surgery)  Extended Emergency Contact Information Primary Emergency Contact: [place] Phone: [phone] Relation: Daughter  Code Status: DNR Goals of Care: Advanced Directive information Advanced Directives 11/13/2017  Does Patient Have a Medical Advance Directive? Yes  Type of Advance Directive [place]  Does patient want to make changes to medical advance directive? No - Patient declined  Copy of [place] in Chart? Yes   Chief Complaint  Patient presents with  . Annual Exam    CPE    HPI: Patient is a 84 y.o. female seen today for an annual physical exam.  Is upset about her wife. Eats fish and occasionally chicken.  Mostly does not eat the desserts.  Munches on banana chips, nuts, etc.  She's not a kitchen person.  Talking about food bores her.  Likes to go out for dinner.  Met with a dietitian a long time ago.  She says the food upstairs is too salty.  She gets sauce on the side or skips it.  Had bread this am (45 cal), an avocado spread on it and a cup of coffee.  Does not follow a regular meal schedule.  Ate just fish in [employer] fish sandwich yesterday.  Ended up with a frozen meal last night with vegetarian meatballs.  Says she's not big on a lot of the meal conversation and her friends tend to eat dessert and she's left sitting watching them and wondering why she came out.    Swimming 30 mins again 5 days per week (skips weekend).    She takes her 49 yo dog for a walk and uses her rollator walker to make sure he does not pull her over.  He did pull her  over once when he was young and broke her leg.  Walks him late evening in the cooler temp.  Goes up the hill.  They go to the main lobby and read the newspaper.  He sits on the floor next to her.  She's thinking of getting a second dog.  Wants one she can hold and current dog does not allow this.  She's getting ready to do an art show and another book so she's going to wait until one of those is done to get a dog.    Outside of easier fatigue, she's doing well.  Thinks it may be her poor diet.    BP is well controlled.  Swelling has improved after hip surgery.  Still has some mild edema.  Had prior vein injections to collapse the veins.   Osteoporosis and D deficiency:  On Vitamin D 2000 units daily and D level still less than 40-50 which is recommended in geriatrics.  Does not want any medication for osteoporosis.    Hyperlipidemia:  Knows she's not eating right.  Discussed myfitnesspal as a recording option and wrote down for her.    Has hammertoes so shoes don't fit well and wears sandals.  Then does not walk well.  Has been to a podiatrist, but she did not like that one.    Past Medical History:  Diagnosis Date  . Arthritis   . Closed left  femoral fracture ([place]) 2012   s/p ORIF, Dr. Tamera Punt  . Closed left radial fracture 2012   s/p reduction  . Complication of anesthesia 08/14/2016   not able to think clear for a long time afterwards  . Essential hypertension   . Hyperlipidemia   . Hypothyroidism    Past Surgical History:  Procedure Laterality Date  . ABDOMINAL HYSTERECTOMY    . CATARACT EXTRACTION W/ INTRAOCULAR LENS  IMPLANT, BILATERAL Bilateral 2014  . ORIF FEMUR FRACTURE Left 04/08/2011   Dr. Tamera Punt  . TOTAL HIP ARTHROPLASTY Left 08/20/2016   Procedure: TOTAL HIP ARTHROPLASTY POSTERIOR REMOVE [place] NAIL;  Surgeon: Frederik Pear, MD;  Location: [facility];  Service: Orthopedics;  Laterality: Left;    reports that she has never smoked. She has never used smokeless tobacco. She  reports that she does not drink alcohol or use drugs.  Functional Status Survey:    Family History  Problem Relation Age of Onset  . Cancer Mother   . Heart disease Father     Health Maintenance  Topic Date Due  . INFLUENZA VACCINE  01/16/2018  . TETANUS/TDAP  04/02/2022  . DEXA SCAN  Completed  . PNA vac Low Risk Adult  Completed    Allergies  Allergen Reactions  . Amoxicillin     UNSPECIFIED REACTION   Has patient had a PCN reaction causing immediate rash, facial/tongue/throat swelling, SOB or lightheadedness with hypotension: Unknown Has patient had a PCN reaction causing severe rash involving mucus membranes or skin necrosis: Unknown Has patient had a PCN reaction that required hospitalization No Has patient had a PCN reaction occurring within the last 10 years: No If all of the above answers are "NO", then may proceed with Cephalosporin use.     Outpatient Encounter Medications as of 11/13/2017  Medication Sig  . aspirin EC 81 MG tablet Take 81 mg by mouth daily.  . Cholecalciferol 2000 units CAPS Take 1 capsule (2,000 Units total) by mouth daily.  . furosemide (LASIX) 40 MG tablet Take 1 tablet (40 mg total) by mouth daily.  Marland Kitchen levothyroxine (SYNTHROID, LEVOTHROID) 25 MCG tablet TAKE ONE TABLET BY MOUTH ONCE DAILY BEFORE BREAKFAST  . lovastatin (MEVACOR) 20 MG tablet TAKE 1 TABLET BY MOUTH AT BEDTIME  . triamterene-hydrochlorothiazide (MAXZIDE-25) 37.5-25 MG tablet TAKE ONE TABLET BY MOUTH ONCE DAILY   No facility-administered encounter medications on file as of 11/13/2017.     Review of Systems  Constitutional: Positive for malaise/fatigue. Negative for chills and fever.  HENT: Positive for hearing loss. Negative for congestion.   Eyes: Negative for blurred vision.  Respiratory: Negative for cough and shortness of breath.   Cardiovascular: Positive for leg swelling. Negative for chest pain and palpitations.  Gastrointestinal: Negative for abdominal pain, blood in  stool, constipation, diarrhea and melena.  Genitourinary: Negative for dysuria.  Musculoskeletal: Negative for falls and joint pain.       Hammertoes  Neurological: Negative for dizziness, loss of consciousness and headaches.       Word-finding difficulty  Endo/Heme/Allergies: Does not bruise/bleed easily.  Psychiatric/Behavioral: Positive for memory loss. Negative for depression. The patient is not nervous/anxious and does not have insomnia.     Vitals:   11/13/17 0951  BP: 120/70  Pulse: 63  Temp: 97.6 F (36.4 C)  TempSrc: Oral  SpO2: 98%  Weight: 176 lb (79.8 kg)  Height: 5' 7"  (1.702 m)   Body mass index is 27.57 kg/m. Physical Exam  Constitutional: She is oriented to person, place,  and time. She appears well-developed and well-nourished. No distress.  HENT:  Head: Normocephalic and atraumatic.  Right Ear: External ear normal.  Left Ear: External ear normal.  Nose: Nose normal.  Mouth/Throat: Oropharynx is clear and moist. No oropharyngeal exudate.  Eyes: Pupils are equal, round, and reactive to light. Conjunctivae and EOM are normal.  Neck: Neck supple. No JVD present.  Cardiovascular: Normal rate, regular rhythm, normal heart sounds and intact distal pulses.  Chronic nonpitting edema; prominent spider veins and varicosities  Pulmonary/Chest: Effort normal and breath sounds normal. No respiratory distress.  Abdominal: Soft. Bowel sounds are normal. She exhibits no distension. There is no tenderness.  Musculoskeletal: Normal range of motion. She exhibits no tenderness.  Lymphadenopathy:    She has no cervical adenopathy.  Neurological: She is alert and oriented to person, place, and time. No cranial nerve deficit.  Skin: Skin is warm and dry. Capillary refill takes less than 2 seconds.  Psychiatric: She has a normal mood and affect.    Labs reviewed: Basic Metabolic Panel: Recent Labs    11/05/17 0600  NA 139  K 4.0  BUN 23*  CREATININE 0.8   Liver Function  Tests: Recent Labs    11/05/17 0600  AST 18  ALT 8  ALKPHOS 98   No results for input(s): LIPASE, AMYLASE in the last 8760 hours. No results for input(s): AMMONIA in the last 8760 hours. CBC: Recent Labs    11/05/17 0600  WBC 6.5  HGB 13.1  HCT 39  PLT 243   Cardiac Enzymes: No results for input(s): CKTOTAL, CKMB, CKMBINDEX, TROPONINI in the last 8760 hours. BNP: Invalid input(s): POCBNP Lab Results  Component Value Date   HGBA1C 5.7 (H) 04/10/2011   Lab Results  Component Value Date   TSH 3.77 10/18/2016     Assessment/Plan 1. Annual physical exam -performed today, pt doing well overall -aware she needs to lose weight and get her cholesterol down  2. Essential hypertension -bp at goal with current therapy, cont same regimen--previously tried to get her off dual diuretics, but she has wanted to be back on them   3. Chronic venous insufficiency -elevate feet at rest, avoid high sodium foods, refuses compression hose as they were painful for her  4. Hypothyroidism, unspecified type -cont current levothyroxine, tsh at goal  5. Hyperlipidemia, unspecified hyperlipidemia type -LDL above goal, eating poorly, is going to work on weight and dietary choices, cont mevacor -pt is a young 44   6. Senile osteoporosis -cont with vitamin D 2000 units daily, add caltrate with D just once a day (written down for her)  7. Vitamin D deficiency -again add ca with D to D therapy  8.  Overweight -ongoing, BMI remains above goal, she is not eating right, is swimming 5 days a week for 30 mins which is great -suggested myfitnesspal (wrote down for her) to document her foods and track intake of carbs/proteins/fats since in the past weight watchers worked for her but she does not want to do that now  9.  Hammertoes -worse on left foot -mentions she can only wear sandals and has tried all kinds of shoes -limits exercise considerably -suggested she see Dr. Buelah Manis here who could  help her with footwear and possibly bioprosthetic suggestions/inserts, modifications for the hammertoes to make walking easier and more stable  Labs/tests ordered:  No new  F/u 6 mos on weight  Albin Duckett L. Gene Glazebrook, D.O. [facility] [address], [place] [address] Cell Phone (Mon-Fri 8am-5pm):  [phone] On Call:  [phone] & follow prompts after 5pm & weekends Office Phone:  [phone] Office Fax:  [phone]

## 2018-01-01 ENCOUNTER — Telehealth: Payer: Self-pay | Admitting: Internal Medicine

## 2018-01-01 NOTE — Telephone Encounter (Signed)
I left a message asking the patient to call me at [phone] to schedule AWV with Clarise Cruz at [facility] on 01/07/18 if available. VDM (DD)

## 2018-01-09 DIAGNOSIS — M205X9 Other deformities of toe(s) (acquired), unspecified foot: Secondary | ICD-10-CM | POA: Diagnosis not present

## 2018-01-09 DIAGNOSIS — B351 Tinea unguium: Secondary | ICD-10-CM | POA: Diagnosis not present

## 2018-01-24 NOTE — Telephone Encounter (Signed)
I left a message asking the patient to call me at [phone] to schedule AWV with Clarise Cruz at [facility] on afternoon of 02/04/18 if available. VDM (DD)

## 2018-02-24 DIAGNOSIS — Z23 Encounter for immunization: Secondary | ICD-10-CM | POA: Diagnosis not present

## 2018-05-14 ENCOUNTER — Encounter: Payer: Self-pay | Admitting: Internal Medicine

## 2018-05-20 DIAGNOSIS — H26492 Other secondary cataract, left eye: Secondary | ICD-10-CM | POA: Diagnosis not present

## 2018-05-21 ENCOUNTER — Encounter: Payer: Self-pay | Admitting: Internal Medicine

## 2018-05-21 ENCOUNTER — Non-Acute Institutional Stay: Payer: Medicare Other | Admitting: Internal Medicine

## 2018-05-21 VITALS — BP 128/70 | HR 76 | Temp 97.6°F | Ht 67.0 in | Wt 172.0 lb

## 2018-05-21 DIAGNOSIS — E039 Hypothyroidism, unspecified: Secondary | ICD-10-CM | POA: Diagnosis not present

## 2018-05-21 DIAGNOSIS — E785 Hyperlipidemia, unspecified: Secondary | ICD-10-CM | POA: Diagnosis not present

## 2018-05-21 DIAGNOSIS — M810 Age-related osteoporosis without current pathological fracture: Secondary | ICD-10-CM | POA: Diagnosis not present

## 2018-05-21 DIAGNOSIS — E663 Overweight: Secondary | ICD-10-CM | POA: Diagnosis not present

## 2018-05-21 DIAGNOSIS — Z6826 Body mass index (BMI) 26.0-26.9, adult: Secondary | ICD-10-CM

## 2018-05-21 DIAGNOSIS — F4329 Adjustment disorder with other symptoms: Secondary | ICD-10-CM

## 2018-05-21 DIAGNOSIS — F4381 Prolonged grief disorder: Secondary | ICD-10-CM

## 2018-05-21 DIAGNOSIS — I10 Essential (primary) hypertension: Secondary | ICD-10-CM

## 2018-05-21 NOTE — Progress Notes (Signed)
Location:  Occupational psychologist of Service:  Clinic (12)  Provider: Nero Sawatzky L. Mariea Clonts, D.O., C.M.D.  Code Status: DNR Goals of Care:  Advanced Directives 11/13/2017  Does Patient Have a Medical Advance Directive? Yes  Type of Advance Directive [place]  Does patient want to make changes to medical advance directive? No - Patient declined  Copy of [place] in Chart? Yes   Chief Complaint  Patient presents with  . Medical Management of Chronic Issues    4mth follow-up    HPI: Patient is a 84 y.o. female seen today for medical management of chronic diseases.    Pt has not scheduled AWV despite two phone calls.  BP at goal.    Hypothyroidism:  Needs new tsh.  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  43 pieces.  She is getting it photographed and going to make a coffee table book of the art.  She says she is displeased with her weight.  She tries to eat better.  She has been drinking some boost.    She admits to still mourning over the loss of her husband.  She woke up talking to him one morning.  That doesn't happen real often.  It's been three years and some.  She is also going to have some work dedicated to him at [employer] for [employer].  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  She is very lonely.  She keeps busy.  She's with her blind, deaf 64 yo dog.  She misses her cuddly dog she used to have.  Wanted to get a little dog.  She was told she was too old.  She struggles to bend over and pick things up. She gets tired easier.  she only can do a few things per day or she's exhausted the next day.  She stopped swimming, but she does do the nustep daily and brings her dog along to the center.  She knows she should walk more.  She admits to being depressed.  The other day it was particularly bad and she didn't do anything that day and felt ok the next day.   She says she is not going to take anything for it.  She knows she just has to relax and get over it.  She doesn't drink.  She tries no to overeat.  Goes out with other people at lunch and knows she's eating what she shouldn't then.   Her power of attorney documents do not have the living will portion completed.  Past Medical History:  Diagnosis Date  . Arthritis   . Closed left femoral fracture ([place]) 2012   s/p ORIF, Dr. Tamera Punt  . Closed left radial fracture 2012   s/p reduction  . Complication of anesthesia 08/14/2016   not able to think clear for a long time afterwards  . Essential hypertension   . Hyperlipidemia   . Hypothyroidism     Past Surgical History:  Procedure Laterality Date  . ABDOMINAL HYSTERECTOMY    . CATARACT EXTRACTION W/ INTRAOCULAR LENS  IMPLANT, BILATERAL Bilateral 2014  . ORIF FEMUR FRACTURE Left 04/08/2011   Dr. Tamera Punt  . TOTAL HIP ARTHROPLASTY Left 08/20/2016   Procedure: TOTAL HIP ARTHROPLASTY POSTERIOR REMOVE [place] NAIL;  Surgeon: Frederik Pear, MD;  Location: [facility];  Service: Orthopedics;  Laterality: Left;    Allergies  Allergen Reactions  . Amoxicillin  UNSPECIFIED REACTION   Has patient had a PCN reaction causing immediate rash, facial/tongue/throat swelling, SOB or lightheadedness with hypotension: Unknown Has patient had a PCN reaction causing severe rash involving mucus membranes or skin necrosis: Unknown Has patient had a PCN reaction that required hospitalization No Has patient had a PCN reaction occurring within the last 10 years: No If all of the above answers are "NO", then may proceed with Cephalosporin use.     Outpatient Encounter Medications as of 05/21/2018  Medication Sig  . aspirin EC 81 MG tablet Take 81 mg by mouth daily.  . Cholecalciferol 2000 units CAPS Take 1 capsule (2,000 Units total) by mouth daily.  . furosemide (LASIX) 40 MG tablet Take 1 tablet (40 mg total) by mouth daily.  Marland Kitchen levothyroxine (SYNTHROID, LEVOTHROID)  25 MCG tablet TAKE ONE TABLET BY MOUTH ONCE DAILY BEFORE BREAKFAST  . lovastatin (MEVACOR) 20 MG tablet TAKE 1 TABLET BY MOUTH AT BEDTIME  . triamterene-hydrochlorothiazide (MAXZIDE-25) 37.5-25 MG tablet TAKE ONE TABLET BY MOUTH ONCE DAILY   No facility-administered encounter medications on file as of 05/21/2018.     Review of Systems:  Review of Systems  Constitutional: Negative for chills, fever and malaise/fatigue.  HENT: Positive for hearing loss. Negative for congestion.   Eyes: Negative for blurred vision.  Respiratory: Negative for shortness of breath.   Cardiovascular: Positive for leg swelling. Negative for chest pain and palpitations.  Gastrointestinal: Negative for abdominal pain, blood in stool, constipation, diarrhea and melena.  Genitourinary: Negative for dysuria.  Musculoskeletal: Negative for falls and joint pain.  Skin: Negative for itching and rash.  Neurological: Negative for dizziness and loss of consciousness.  Endo/Heme/Allergies: Does not bruise/bleed easily.  Psychiatric/Behavioral: Positive for depression. Negative for memory loss. The patient is not nervous/anxious and does not have insomnia.     Health Maintenance  Topic Date Due  . TETANUS/TDAP  04/02/2022  . INFLUENZA VACCINE  Completed  . DEXA SCAN  Completed  . PNA vac Low Risk Adult  Completed    Physical Exam: Vitals:   05/21/18 1358  BP: 128/70  Pulse: 76  Temp: 97.6 F (36.4 C)  TempSrc: Oral  SpO2: 98%  Weight: 172 lb (78 kg)  Height: 5\' 7"  (1.702 m)   Body mass index is 26.94 kg/m. Physical Exam  Constitutional: She is oriented to person, place, and time. She appears well-developed and well-nourished. No distress.  HENT:  Head: Normocephalic and atraumatic.  Cardiovascular: Normal rate, regular rhythm, normal heart sounds and intact distal pulses.  Swollen, nonpitting legs  Pulmonary/Chest: Effort normal and breath sounds normal. No respiratory distress.  Abdominal: Bowel sounds  are normal.  Musculoskeletal: Normal range of motion.  Neurological: She is alert and oriented to person, place, and time. No cranial nerve deficit.  Skin: Skin is warm and dry. Capillary refill takes less than 2 seconds.  Psychiatric: She has a normal mood and affect.    Labs reviewed: Basic Metabolic Panel: Recent Labs    11/05/17 0600  NA 139  K 4.0  BUN 23*  CREATININE 0.8   Liver Function Tests: Recent Labs    11/05/17 0600  AST 18  ALT 8  ALKPHOS 98   No results for input(s): LIPASE, AMYLASE in the last 8760 hours. No results for input(s): AMMONIA in the last 8760 hours. CBC: Recent Labs    11/05/17  WBC 6.5  HGB 13.1  HCT 39  PLT 243   Lipid Panel: Recent Labs    11/05/17  CHOL 215*  HDL 70  LDLCALC 128  TRIG 82   Lab Results  Component Value Date   HGBA1C 5.7 (H) 04/10/2011    Assessment/Plan 1. Hypothyroidism, unspecified type -cont current levothyroxine; f/u tsh before next visit in 6 mos  2. Essential hypertension -bp at goal, cont same regimen, nustep for exercise  3. Hyperlipidemia, unspecified hyperlipidemia type -f/u lipids before next visit, is trying to do right with her dietary choices  4. Senile osteoporosis -refuses medication for this besides vitamins  5. Prolonged grief reaction -has been several years since her husband passed away--is talking about this more today -refuses medication for depression or grief and counseling  6. Overweight (BMI 25.0-29.9) -ongoing, has switched from swimming to nustep and weight stable, but not dropping  7. BMI 26.0-26.9,adult -counseled on healthy diet and continued exercise  Labs/tests ordered:  Cbc, cmp, flp, tsh before Next appt:  6 mos med mgt, fasting labs before    Valetta Mulroy L. Greysyn Vanderberg, D.O. [facility] [address], [place] [address] Cell Phone (Mon-Fri 8am-5pm):  [phone] On Call:  [phone] & follow prompts after 5pm &  weekends Office Phone:  [phone] Office Fax:  [phone]

## 2018-07-28 ENCOUNTER — Other Ambulatory Visit: Payer: Self-pay | Admitting: Internal Medicine

## 2018-10-12 ENCOUNTER — Encounter: Payer: Self-pay | Admitting: Internal Medicine

## 2018-10-29 ENCOUNTER — Other Ambulatory Visit: Payer: Self-pay | Admitting: Internal Medicine

## 2018-10-29 MED ORDER — LEVOTHYROXINE SODIUM 25 MCG PO TABS: 25.0000 ug | ORAL_TABLET | Freq: Every day | ORAL | 0 refills | Status: DC

## 2018-11-05 ENCOUNTER — Other Ambulatory Visit: Payer: Self-pay | Admitting: *Deleted

## 2018-11-05 MED ORDER — CHOLECALCIFEROL 50 MCG (2000 UT) PO CAPS: 2000.0000 [IU] | ORAL_CAPSULE | Freq: Every day | ORAL | 3 refills | Status: DC

## 2018-11-05 MED ORDER — LEVOTHYROXINE SODIUM 25 MCG PO TABS: 25.0000 ug | ORAL_TABLET | Freq: Every day | ORAL | 3 refills | Status: DC

## 2018-11-05 MED ORDER — LOVASTATIN 20 MG PO TABS: 20.0000 mg | ORAL_TABLET | Freq: Every day | ORAL | 3 refills | Status: DC

## 2018-11-05 MED ORDER — TRIAMTERENE-HCTZ 37.5-25 MG PO TABS: 1.0000 | ORAL_TABLET | Freq: Every day | ORAL | 3 refills | Status: DC

## 2018-11-18 DIAGNOSIS — E785 Hyperlipidemia, unspecified: Secondary | ICD-10-CM | POA: Diagnosis not present

## 2018-11-18 DIAGNOSIS — I10 Essential (primary) hypertension: Secondary | ICD-10-CM | POA: Diagnosis not present

## 2018-11-18 DIAGNOSIS — D649 Anemia, unspecified: Secondary | ICD-10-CM | POA: Diagnosis not present

## 2018-11-18 DIAGNOSIS — E039 Hypothyroidism, unspecified: Secondary | ICD-10-CM | POA: Diagnosis not present

## 2018-11-18 LAB — BASIC METABOLIC PANEL
BUN: 25 — AB (ref 4–21)
Creatinine: 0.9 (ref 0.5–1.1)
Glucose: 102
Potassium: 4.1 (ref 3.4–5.3)
Sodium: 141 (ref 137–147)

## 2018-11-18 LAB — HEPATIC FUNCTION PANEL
ALT: 10 (ref 7–35)
AST: 20 (ref 13–35)
Alkaline Phosphatase: 110 (ref 25–125)
Bilirubin, Total: 0.4

## 2018-11-18 LAB — LIPID PANEL
Cholesterol: 193 (ref 0–200)
HDL: 61 (ref 35–70)
LDL Cholesterol: 112
Triglycerides: 99 (ref 40–160)

## 2018-11-18 LAB — TSH: TSH: 3.6 (ref 0.41–5.90)

## 2018-11-18 LAB — CBC AND DIFFERENTIAL
HCT: 36 (ref 36–46)
Hemoglobin: 12.1 (ref 12.0–16.0)
Platelets: 235 (ref 150–399)
WBC: 5.8

## 2018-11-21 ENCOUNTER — Encounter: Payer: Self-pay | Admitting: Internal Medicine

## 2018-11-26 ENCOUNTER — Encounter: Payer: Medicare Other | Admitting: Internal Medicine

## 2018-11-26 ENCOUNTER — Other Ambulatory Visit: Payer: Self-pay

## 2019-01-07 ENCOUNTER — Non-Acute Institutional Stay: Payer: Medicare Other | Admitting: Internal Medicine

## 2019-01-07 ENCOUNTER — Encounter: Payer: Self-pay | Admitting: Internal Medicine

## 2019-01-07 ENCOUNTER — Other Ambulatory Visit: Payer: Self-pay

## 2019-01-07 VITALS — BP 118/70 | HR 69 | Temp 98.0°F | Ht 67.0 in | Wt 166.0 lb

## 2019-01-07 DIAGNOSIS — I10 Essential (primary) hypertension: Secondary | ICD-10-CM | POA: Diagnosis not present

## 2019-01-07 DIAGNOSIS — E7800 Pure hypercholesterolemia, unspecified: Secondary | ICD-10-CM

## 2019-01-07 DIAGNOSIS — I872 Venous insufficiency (chronic) (peripheral): Secondary | ICD-10-CM

## 2019-01-07 DIAGNOSIS — E039 Hypothyroidism, unspecified: Secondary | ICD-10-CM

## 2019-01-07 DIAGNOSIS — M810 Age-related osteoporosis without current pathological fracture: Secondary | ICD-10-CM | POA: Diagnosis not present

## 2019-01-07 NOTE — Progress Notes (Signed)
Location:  Occupational psychologist of Service:  Clinic (12)  Provider: Daily Doe L. Mariea Clonts, D.O., C.M.D.  Code Status: DNR Goals of Care:  Advanced Directives 11/13/2017  Does Patient Have a Medical Advance Directive? Yes  Type of Advance Directive [place]  Does patient want to make changes to medical advance directive? No - Patient declined  Copy of [place] in Chart? Yes   Chief Complaint  Patient presents with  . Medical Management of Chronic Issues    59mh follow-up    HPI: Patient is a 85y.o. female seen today for medical management of chronic diseases.    She has lost 6 lbs since December.  She's not getting there as fast as she'd like.  She stopped eating breakfast.  She gets lunch early.  Does not eat after 7pm.  She had previously been having egg and avocado for breakfast.  Now has veggies early afternoon, then the rest of the meal at 6pm.  That keeps her filled up.  She's gotten used to that.  She stopped eating desserts.  She's not been back to the pool.  She has a little dog she's fostering.  They go to the dog park twice a day so she's walking--uses rollator for that.    cholesterol has come down quite a bit.  She ha stopped making cornbread.    Thyroid at goal with current levothyroxine.    She finished a new book.  She also did an art class for seniors.  She put the art work from the class into the book.  Keeping projects going is  Very important for her.  Has some days that are hard.  When the gym opens up, she will go back to using the nustep.    Discussed idea of other medications for her bones to keep them stronger.  She's not interested.  She agrees to increase milk products (lactose-free).    Past Medical History:  Diagnosis Date  . Arthritis   . Closed left femoral fracture ([place] 2012   s/p ORIF, Dr. CTamera Punt . Closed left radial fracture 2012   s/p reduction  . Complication of anesthesia  08/14/2016   not able to think clear for a long time afterwards  . Essential hypertension   . Hyperlipidemia   . Hypothyroidism     Past Surgical History:  Procedure Laterality Date  . ABDOMINAL HYSTERECTOMY    . CATARACT EXTRACTION W/ INTRAOCULAR LENS  IMPLANT, BILATERAL Bilateral 2014  . ORIF FEMUR FRACTURE Left 04/08/2011   Dr. CTamera Punt . TOTAL HIP ARTHROPLASTY Left 08/20/2016   Procedure: TOTAL H[place]LASTY [place]IOR [place];  Surgeon: FFrederik Pear MD;  [place] [facility]  Service: Orthopedics;  Laterality: Left;    Allergies  Allergen Reactions  . Amoxicillin     UNSPECIFIED REACTION   Has patient had a PCN reaction causing immediate rash, facial/tongue/throat swelling, SOB or lightheadedness with hypotension: Unknown Has patient had a PCN reaction causing severe rash involving mucus membranes or skin necrosis: Unknown Has patient had a PCN reaction that required hospitalization No Has patient had a PCN reaction occurring within the last 10 years: No If all of the above answers are "NO", then may proceed with Cephalosporin use.     Outpatient Encounter Medications as of 01/07/2019  Medication Sig  . aspirin EC 81 MG tablet Take 81 mg by mouth daily.  . Cholecalciferol 50 MCG (2000 UT) CAPS Take 1  capsule (2,000 Units total) by mouth daily.  Marland Kitchen levothyroxine (SYNTHROID) 25 MCG tablet Take 1 tablet (25 mcg total) by mouth daily before breakfast.  . lovastatin (MEVACOR) 20 MG tablet Take 1 tablet (20 mg total) by mouth at bedtime.  . triamterene-hydrochlorothiazide (MAXZIDE-25) 37.5-25 MG tablet Take 1 tablet by mouth daily.  . [DISCONTINUED] furosemide (LASIX) 40 MG tablet Take 1 tablet (40 mg total) by mouth daily.   No facility-administered encounter medications on file as of 01/07/2019.     Review of Systems:  Review of Systems  Constitutional: Negative for chills, fever and malaise/fatigue.  HENT: Positive for hearing loss. Negative for congestion.   Eyes:  Negative for blurred vision.  Respiratory: Negative for cough and shortness of breath.   Cardiovascular: Positive for leg swelling. Negative for chest pain and palpitations.  Gastrointestinal: Negative for abdominal pain, blood in stool, constipation, diarrhea and melena.  Genitourinary: Negative for dysuria.  Skin: Negative for itching and rash.  Neurological: Negative for dizziness and loss of consciousness.  Endo/Heme/Allergies: Bruises/bleeds easily.  Psychiatric/Behavioral: Positive for memory loss. Negative for depression. The patient is not nervous/anxious and does not have insomnia.        Mild memory loss and some word-finding struggles    Health Maintenance  Topic Date Due  . INFLUENZA VACCINE  01/17/2019  . TETANUS/TDAP  04/02/2022  . DEXA SCAN  Completed  . PNA vac Low Risk Adult  Completed    Physical Exam: Vitals:   01/07/19 1034  BP: 118/70  Pulse: 69  Temp: 98 F (36.7 C)  TempSrc: Oral  SpO2: 96%  Weight: 166 lb (75.3 kg)  Height: 5' 7"  (1.702 m)   Body mass index is 26 kg/m. Physical Exam Vitals signs reviewed.  Constitutional:      General: She is not in acute distress.    Appearance: Normal appearance. She is not ill-appearing or toxic-appearing.  HENT:     Head: Normocephalic and atraumatic.  Eyes:     Extraocular Movements: Extraocular movements intact.     Pupils: Pupils are equal, round, and reactive to light.  Cardiovascular:     Rate and Rhythm: Normal rate and regular rhythm.     Pulses: Normal pulses.     Heart sounds: Normal heart sounds.  Pulmonary:     Effort: Pulmonary effort is normal.     Breath sounds: Normal breath sounds. No wheezing, rhonchi or rales.  Musculoskeletal: Normal range of motion.     Right lower leg: Edema present.     Left lower leg: Edema present.     Comments: Nonpitting edema; pt had vein-stripping   Skin:    General: Skin is warm and dry.     Capillary Refill: Capillary refill takes less than 2 seconds.    Neurological:     General: No focal deficit present.     Mental Status: She is alert and oriented to person, place, and time.     Comments: Occasional difficulty finding a word  Psychiatric:        Mood and Affect: Mood normal.     Labs reviewed: Basic Metabolic Panel: Recent Labs    11/18/18 11/18/18 0800  NA 141  --   K 4.1  --   BUN 25*  --   CREATININE 0.9  --   TSH  --  3.60   Liver Function Tests: Recent Labs    11/18/18 0800  AST 20  ALT 10  ALKPHOS 110   No results for  input(s): LIPASE, AMYLASE in the last 8760 hours. No results for input(s): AMMONIA in the last 8760 hours. CBC: Recent Labs    11/18/18  WBC 5.8  HGB 12.1  HCT 36  PLT 235   Lipid Panel: Recent Labs    11/18/18  CHOL 193  HDL 61  LDLCALC 112  TRIG 99   Lab Results  Component Value Date   HGBA1C 5.7 (H) 04/10/2011    Assessment/Plan 1. Hypothyroidism, unspecified type -tsh at goal; cont levothyroxine 4mg daily  2. Senile osteoporosis -is not interested in medications for bone density--does not want more pills -continue vitamin D3 and weightbearing exercise with walking and eventually getting back on nustep when gym reopens  3. Essential hypertension -bp at goal with current regimen which includes diuretic  4. Chronic venous insufficiency -ongoing, does not want to wear compression hose; does try to avoid sodium, encouraged elevation of feet at rest and regular exercise  5. Pure hypercholesterolemia -cont lovastatin 238m-she did not want to go up on this either, continue dietary improvements (fewer eggs, avoiding desserts)  Labs/tests ordered:  Cbc, fmp, flp, tsh before Next appt:  6 mos EV with labs before  Phenix Vandermeulen L. Linsi Humann, D.O. [facility] [address] [place] [address] Phone (Mon-Fri 8am-5pm):  [phone] Call:  [phone] follow prompts after 5pm & weekends Office Phone:  [phone]  Fax:  [phone]

## 2019-01-14 ENCOUNTER — Encounter: Payer: Medicare Other | Admitting: Internal Medicine

## 2019-03-17 DIAGNOSIS — Z23 Encounter for immunization: Secondary | ICD-10-CM | POA: Diagnosis not present

## 2019-04-21 ENCOUNTER — Encounter: Payer: Self-pay | Admitting: Nurse Practitioner

## 2019-04-21 ENCOUNTER — Ambulatory Visit (INDEPENDENT_AMBULATORY_CARE_PROVIDER_SITE_OTHER): Payer: Medicare Other | Admitting: Nurse Practitioner

## 2019-04-21 ENCOUNTER — Other Ambulatory Visit: Payer: Self-pay

## 2019-04-21 VITALS — Ht 67.0 in | Wt 160.0 lb

## 2019-04-21 DIAGNOSIS — E2839 Other primary ovarian failure: Secondary | ICD-10-CM

## 2019-04-21 DIAGNOSIS — Z0000 Encounter for general adult medical examination without abnormal findings: Secondary | ICD-10-CM | POA: Diagnosis not present

## 2019-04-21 NOTE — Progress Notes (Signed)
Subjective:   Amber Bryant is a 85 y.o. female who presents for Medicare Annual (Subsequent) preventive examination.  Review of Systems:   Cardiac Risk Factors include: advanced age (>32men, >73 women);hypertension;dyslipidemia     Objective:     Vitals: Ht 5\' 7"  (1.702 m)   Wt 160 lb (72.6 kg)   BMI 25.06 kg/m   Body mass index is 25.06 kg/m.  Advanced Directives 04/21/2019 04/21/2019 11/13/2017 05/15/2017 01/01/2017 10/10/2016 09/11/2016  Does Patient Have a Medical Advance Directive? Yes Yes Yes Yes Yes Yes Yes  Type of Arts administrator Power of [place] of [place];Living will Healthcare Power of [place];Living will  Does patient want to make changes to medical advance directive? No - Patient declined No - Patient declined No - Patient declined - No - Patient declined - -  Copy of [place] in Chart? Yes - validated most recent copy scanned in chart (See row information) Yes - validated most recent copy scanned in chart (See row information) Yes Yes Yes - Yes    Tobacco Social History   Tobacco Use  Smoking Status Never Smoker  Smokeless Tobacco Never Used     Counseling given: Not Answered   Clinical Intake:  Pre-visit preparation completed: Yes  Pain : No/denies pain     BMI - recorded: 26 Nutritional Status: BMI 25 -29 Overweight Diabetes: No  How often do you need to have someone help you when you read instructions, pamphlets, or other written materials from your doctor or pharmacy?: 1 - Never What is the last grade level you completed in school?: college  Interpreter Needed?: No     Past Medical History:  Diagnosis Date  . Arthritis   . Closed left femoral fracture ([place]) 2012   s/p ORIF, Dr. Tamera Punt  . Closed left radial fracture 2012   s/p reduction  . Complication of anesthesia  08/14/2016   not able to think clear for a long time afterwards  . Essential hypertension   . Hyperlipidemia   . Hypothyroidism    Past Surgical History:  Procedure Laterality Date  . ABDOMINAL HYSTERECTOMY    . CATARACT EXTRACTION W/ INTRAOCULAR LENS  IMPLANT, BILATERAL Bilateral 2014  . ORIF FEMUR FRACTURE Left 04/08/2011   Dr. Tamera Punt  . TOTAL HIP ARTHROPLASTY Left 08/20/2016   Procedure: TOTAL HIP ARTHROPLASTY POSTERIOR REMOVE [place] NAIL;  Surgeon: Frederik Pear, MD;  Location: [facility];  Service: Orthopedics;  Laterality: Left;   Family History  Problem Relation Age of Onset  . Cancer Mother   . Heart disease Father    Social History   Socioeconomic History  . Marital status: Married    Spouse name: Not on file  . Number of children: Not on file  . Years of education: Not on file  . Highest education level: Not on file  Occupational History  . Not on file  Social Needs  . Financial resource strain: Not on file  . Food insecurity    Worry: Not on file    Inability: Not on file  . Transportation needs    Medical: Not on file    Non-medical: Not on file  Tobacco Use  . Smoking status: Never Smoker  . Smokeless tobacco: Never Used  Substance and Sexual Activity  . Alcohol use: No  . Drug use: No  . Sexual activity: Never  Lifestyle  . Physical  activity    Days per week: Not on file    Minutes per session: Not on file  . Stress: Not on file  Relationships  . Social Herbalist on phone: Not on file    Gets together: Not on file    Attends religious service: Not on file    Active member of club or organization: Not on file    Attends meetings of clubs or organizations: Not on file    Relationship status: Not on file  Other Topics Concern  . Not on file  Social History Narrative   Tobacco use, amount per day now: NONE   Past tobacco use, amount per day: NONE   How many years did you use tobacco: NONE   Alcohol use (drinks per week): 1   Diet: ONGOING    Do you drink/eat things with caffeine: YES   Marital status: WIDOWED                       What year were you married? 1956   Do you live in a house, apartment, assisted living, condo, trailer, etc.? [place]   Is it one or more stories? ONE   How many persons live in your home? ONE   Do you have pets in your home?( please list) 3 DOGS   Current or past profession: PUBLISHER   Do you exercise?   YES                               Type and how often? SWIM DAILY   Do you have a living will? YES   Do you have a DNR form?   YES                                If not, do you want to discuss one?   Do you have signed POA/HPOA forms?  YES                      If so, please bring to you appointment    Outpatient Encounter Medications as of 04/21/2019  Medication Sig  . aspirin EC 81 MG tablet Take 81 mg by mouth daily.  . Biotin 10000 MCG TABS Take 1 tablet by mouth daily.  . Cholecalciferol 50 MCG (2000 UT) CAPS Take 1 capsule (2,000 Units total) by mouth daily.  Marland Kitchen levothyroxine (SYNTHROID) 25 MCG tablet Take 1 tablet (25 mcg total) by mouth daily before breakfast.  . lovastatin (MEVACOR) 20 MG tablet Take 1 tablet (20 mg total) by mouth at bedtime.  . triamterene-hydrochlorothiazide (MAXZIDE-25) 37.5-25 MG tablet Take 1 tablet by mouth daily.   No facility-administered encounter medications on file as of 04/21/2019.     Activities of Daily Living In your present state of health, do you have any difficulty performing the following activities: 04/21/2019  Hearing? Y  Vision? N  Difficulty concentrating or making decisions? N  Walking or climbing stairs? Y  Dressing or bathing? N  Doing errands, shopping? N  Preparing Food and eating ? N  Using the Toilet? N  In the past six months, have you accidently leaked urine? N  Do you have problems with loss of bowel control? N  Managing your Medications? N  Managing your Finances? N  Housekeeping or managing your  Housekeeping?  N  Some recent data might be hidden    Patient Care Team: Gayland Curry, DO as PCP - General (Geriatric Medicine) Sharyne Peach, MD as Consulting Physician (Ophthalmology) Tania Ade, MD as Consulting Physician (Orthopedic Surgery)    Assessment:   This is a routine wellness examination for Amber Bryant.  Exercise Activities and Dietary recommendations Current Exercise Habits: Home exercise routine;Structured exercise class, Type of exercise: walking;calisthenics, Frequency (Times/Week): 7, Intensity: Moderate  Goals    . Increase water and decrease salt     Pt will increase awareness of salt and water intake     . Patient Stated     Would like to live to be 61       Fall Risk Fall Risk  04/21/2019 01/07/2019 05/21/2018 05/21/2018 11/13/2017  Falls in the past year? 0 0 1 0 No  Number falls in past yr: - 0 0 0 -  Injury with Fall? - 0 0 0 -  Risk for fall due to : - - History of fall(s);Impaired mobility - -  Follow up - - Falls evaluation completed - -   Is the patient's home free of loose throw rugs in walkways, pet beds, electrical cords, etc?   yes      Grab bars in the bathroom? yes      Handrails on the stairs?   no stairs      Adequate lighting?   yes  Timed Get Up and Go performed: na  Depression Screen PHQ 2/9 Scores 04/21/2019 01/07/2019 05/21/2018 05/21/2018  PHQ - 2 Score 0 0 1 0     Cognitive Function MMSE - Mini Mental State Exam 01/01/2017  Orientation to time 5  Orientation to Place 5  Registration 3  Attention/ Calculation 5  Recall 3  Language- name 2 objects 2  Language- repeat 1  Language- follow 3 step command 3  Language- read & follow direction 1  Write a sentence 1  Copy design 1  Total score 30     6CIT Screen 04/21/2019  What Year? 0 points  What month? 0 points  What time? 0 points  Count back from 20 0 points  Months in reverse 4 points  Repeat phrase 0 points  Total Score 4    Immunization History   Administered Date(s) Administered  . H1N1 05/27/2008  . Influenza, High Dose Seasonal PF 03/23/2016, 04/03/2017, 02/24/2018  . Influenza-Unspecified 03/18/2016, 03/19/2019    Qualifies for Shingles Vaccine?yes  Screening Tests Health Maintenance  Topic Date Due  . TETANUS/TDAP  04/02/2022  . INFLUENZA VACCINE  Completed  . DEXA SCAN  Completed  . PNA vac Low Risk Adult  Completed    Cancer Screenings: Lung: Low Dose CT Chest recommended if Age 13-80 years, 30 pack-year currently smoking OR have quit w/in 15years. Patient does not qualify. Breast:  Up to date on Mammogram? Yes /aged out Up to date of Bone Density/Dexa? No Colorectal: aged out  Additional Screenings:  Hepatitis C Screening: na     Plan:      I have personally reviewed and noted the following in the patient's chart:   . Medical and social history . Use of alcohol, tobacco or illicit drugs  . Current medications and supplements . Functional ability and status . Nutritional status . Physical activity . Advanced directives . List of other physicians . Hospitalizations, surgeries, and ER visits in previous 12 months . Vitals . Screenings to include cognitive, depression, and falls . Referrals and appointments  In addition, I have reviewed and discussed with patient certain preventive protocols, quality metrics, and best practice recommendations. A written personalized care plan for preventive services as well as general preventive health recommendations were provided to patient.     Lauree Chandler, NP  04/21/2019

## 2019-04-21 NOTE — Patient Instructions (Addendum)
Amber Bryant , Thank you for taking time to come for your Medicare Wellness Visit. I appreciate your ongoing commitment to your health goals. Please review the following plan we discussed and let me know if I can assist you in the future.   Screening recommendations/referrals: Colonoscopy aged out Mammogram aged out Bone Density- ORDER PLACED - call [facility] if you do not hear from them.  Recommended yearly ophthalmology/optometry visit for glaucoma screening and checkup Recommended yearly dental visit for hygiene and checkup  Vaccinations: Influenza vaccine - completed Pneumococcal vaccine- you report you have had these- please see if you can get these records so we can update our files.  Tdap vaccine- please check your records to see if this is due  Shingles vaccine- recommend you get shingrix series at your local pharmacy.   Advanced directives: on file  Conditions/risks identified: none.  Next appointment: 1 year   Preventive Care 75 Years and Older, Female Preventive care refers to lifestyle choices and visits with your health care provider that can promote health and wellness. What does preventive care include?  A yearly physical exam. This is also called an annual well check.  Dental exams once or twice a year.  Routine eye exams. Ask your health care provider how often you should have your eyes checked.  Personal lifestyle choices, including:  Daily care of your teeth and gums.  Regular physical activity.  Eating a healthy diet.  Avoiding tobacco and drug use.  Limiting alcohol use.  Practicing safe sex.  Taking low-dose aspirin every day.  Taking vitamin and mineral supplements as recommended by your health care provider. What happens during an annual well check? The services and screenings done by your health care provider during your annual well check will depend on your age, overall health, lifestyle risk factors, and family  history of disease. Counseling  Your health care provider may ask you questions about your:  Alcohol use.  Tobacco use.  Drug use.  Emotional well-being.  Home and relationship well-being.  Sexual activity.  Eating habits.  History of falls.  Memory and ability to understand (cognition).  Work and work Statistician.  Reproductive health. Screening  You may have the following tests or measurements:  Height, weight, and BMI.  Blood pressure.  Lipid and cholesterol levels. These may be checked every 5 years, or more frequently if you are over 5 years old.  Skin check.  Lung cancer screening. You may have this screening every year starting at age 69 if you have a 30-pack-year history of smoking and currently smoke or have quit within the past 15 years.  Fecal occult blood test (FOBT) of the stool. You may have this test every year starting at age 84.  Flexible sigmoidoscopy or colonoscopy. You may have a sigmoidoscopy every 5 years or a colonoscopy every 10 years starting at age 33.  Hepatitis C blood test.  Hepatitis B blood test.  Sexually transmitted disease (STD) testing.  Diabetes screening. This is done by checking your blood sugar (glucose) after you have not eaten for a while (fasting). You may have this done every 1-3 years.  Bone density scan. This is done to screen for osteoporosis. You may have this done starting at age 62.  Mammogram. This may be done every 1-2 years. Talk to your health care provider about how often you should have regular mammograms. Talk with your health care provider about your test results, treatment options, and if necessary, the need for  more tests. Vaccines  Your health care provider may recommend certain vaccines, such as:  Influenza vaccine. This is recommended every year.  Tetanus, diphtheria, and acellular pertussis (Tdap, Td) vaccine. You may need a Td booster every 10 years.  Zoster vaccine. You may need this after  age 30.  Pneumococcal 13-valent conjugate (PCV13) vaccine. One dose is recommended after age 88.  Pneumococcal polysaccharide (PPSV23) vaccine. One dose is recommended after age 98. Talk to your health care provider about which screenings and vaccines you need and how often you need them. This information is not intended to replace advice given to you by your health care provider. Make sure you discuss any questions you have with your health care provider. Document Released: 07/01/2015 Document Revised: 02/22/2016 Document Reviewed: 04/05/2015 Elsevier Interactive Patient Education  2017 [place] Prevention in the Home Falls can cause injuries. They can happen to people of all ages. There are many things you can do to make your home safe and to help prevent falls. What can I do on the outside of my home?  Regularly fix the edges of walkways and driveways and fix any cracks.  Remove anything that might make you trip as you walk through a door, such as a raised step or threshold.  Trim any bushes or trees on the path to your home.  Use bright outdoor lighting.  Clear any walking paths of anything that might make someone trip, such as rocks or tools.  Regularly check to see if handrails are loose or broken. Make sure that both sides of any steps have handrails.  Any raised decks and porches should have guardrails on the edges.  Have any leaves, snow, or ice cleared regularly.  Use sand or salt on walking paths during winter.  Clean up any spills in your garage right away. This includes oil or grease spills. What can I do in the bathroom?  Use night lights.  Install grab bars by the toilet and in the tub and shower. Do not use towel bars as grab bars.  Use non-skid mats or decals in the tub or shower.  If you need to sit down in the shower, use a plastic, non-slip stool.  Keep the floor dry. Clean up any water that spills on the floor as soon as it happens.   Remove soap buildup in the tub or shower regularly.  Attach bath mats securely with double-sided non-slip rug tape.  Do not have throw rugs and other things on the floor that can make you trip. What can I do in the bedroom?  Use night lights.  Make sure that you have a light by your bed that is easy to reach.  Do not use any sheets or blankets that are too big for your bed. They should not hang down onto the floor.  Have a firm chair that has side arms. You can use this for support while you get dressed.  Do not have throw rugs and other things on the floor that can make you trip. What can I do in the kitchen?  Clean up any spills right away.  Avoid walking on wet floors.  Keep items that you use a lot in easy-to-reach places.  If you need to reach something above you, use a strong step stool that has a grab bar.  Keep electrical cords out of the way.  Do not use floor polish or wax that makes floors slippery. If you must use wax, use non-skid  floor wax.  Do not have throw rugs and other things on the floor that can make you trip. What can I do with my stairs?  Do not leave any items on the stairs.  Make sure that there are handrails on both sides of the stairs and use them. Fix handrails that are broken or loose. Make sure that handrails are as long as the stairways.  Check any carpeting to make sure that it is firmly attached to the stairs. Fix any carpet that is loose or worn.  Avoid having throw rugs at the top or bottom of the stairs. If you do have throw rugs, attach them to the floor with carpet tape.  Make sure that you have a light switch at the top of the stairs and the bottom of the stairs. If you do not have them, ask someone to add them for you. What else can I do to help prevent falls?  Wear shoes that:  Do not have high heels.  Have rubber bottoms.  Are comfortable and fit you well.  Are closed at the toe. Do not wear sandals.  If you use a  stepladder:  Make sure that it is fully opened. Do not climb a closed stepladder.  Make sure that both sides of the stepladder are locked into place.  Ask someone to hold it for you, if possible.  Clearly mark and make sure that you can see:  Any grab bars or handrails.  First and last steps.  Where the edge of each step is.  Use tools that help you move around (mobility aids) if they are needed. These include:  Canes.  Walkers.  Scooters.  Crutches.  Turn on the lights when you go into a dark area. Replace any light bulbs as soon as they burn out.  Set up your furniture so you have a clear path. Avoid moving your furniture around.  If any of your floors are uneven, fix them.  If there are any pets around you, be aware of where they are.  Review your medicines with your doctor. Some medicines can make you feel dizzy. This can increase your chance of falling. Ask your doctor what other things that you can do to help prevent falls. This information is not intended to replace advice given to you by your health care provider. Make sure you discuss any questions you have with your health care provider. Document Released: 03/31/2009 Document Revised: 11/10/2015 Document Reviewed: 07/09/2014 Elsevier Interactive Patient Education  2017 [employer].

## 2019-04-21 NOTE — Progress Notes (Signed)
This service is provided via telemedicine  No vital signs collected/recorded due to the encounter was a telemedicine visit.   Location of patient (ex: home, work):  Home  Patient consents to a telephone visit:  Yes  Location of the provider (ex: office, home): [facility]  Name of any referring provider:  Hollace Kinnier, DO  Names of all persons participating in the telemedicine service and their role in the encounter:  Bonney Leitz, Gillis; Sherrie Mustache, NP, patient   Time spent on call: 5.28  CMA time only

## 2019-06-30 DIAGNOSIS — Z23 Encounter for immunization: Secondary | ICD-10-CM | POA: Diagnosis not present

## 2019-07-02 ENCOUNTER — Encounter: Payer: Self-pay | Admitting: Internal Medicine

## 2019-07-02 DIAGNOSIS — I10 Essential (primary) hypertension: Secondary | ICD-10-CM | POA: Diagnosis not present

## 2019-07-02 DIAGNOSIS — E785 Hyperlipidemia, unspecified: Secondary | ICD-10-CM | POA: Diagnosis not present

## 2019-07-02 DIAGNOSIS — D649 Anemia, unspecified: Secondary | ICD-10-CM | POA: Diagnosis not present

## 2019-07-02 DIAGNOSIS — Z79899 Other long term (current) drug therapy: Secondary | ICD-10-CM | POA: Diagnosis not present

## 2019-07-02 DIAGNOSIS — E876 Hypokalemia: Secondary | ICD-10-CM | POA: Diagnosis not present

## 2019-07-02 DIAGNOSIS — E039 Hypothyroidism, unspecified: Secondary | ICD-10-CM | POA: Diagnosis not present

## 2019-07-02 LAB — HEPATIC FUNCTION PANEL
ALT: 9 (ref 7–35)
AST: 17 (ref 13–35)
Bilirubin, Total: 0.2

## 2019-07-02 LAB — BASIC METABOLIC PANEL
BUN: 21 (ref 4–21)
CO2: 26 — AB (ref 13–22)
Chloride: 105 (ref 99–108)
Creatinine: 0.8 (ref 0.5–1.1)
Potassium: 3.9 (ref 3.4–5.3)
Sodium: 141 (ref 137–147)

## 2019-07-02 LAB — CBC AND DIFFERENTIAL
HCT: 37 (ref 36–46)
Hemoglobin: 12.4 (ref 12.0–16.0)
Platelets: 217 (ref 150–399)
WBC: 5.2

## 2019-07-02 LAB — LIPID PANEL
HDL: 69 (ref 35–70)
LDL Cholesterol: 108
Triglycerides: 112 (ref 40–160)

## 2019-07-02 LAB — CBC: RBC: 3.97 (ref 3.87–5.11)

## 2019-07-02 LAB — COMPREHENSIVE METABOLIC PANEL
Albumin: 4 (ref 3.5–5.0)
Calcium: 9.5 (ref 8.7–10.7)

## 2019-07-02 LAB — TSH: TSH: 4.49 (ref 0.41–5.90)

## 2019-07-07 ENCOUNTER — Encounter: Payer: Self-pay | Admitting: Internal Medicine

## 2019-07-08 ENCOUNTER — Other Ambulatory Visit: Payer: Self-pay

## 2019-07-08 ENCOUNTER — Non-Acute Institutional Stay: Payer: Medicare Other | Admitting: Internal Medicine

## 2019-07-08 ENCOUNTER — Encounter: Payer: Self-pay | Admitting: Internal Medicine

## 2019-07-08 VITALS — BP 118/70 | HR 83 | Temp 97.5°F | Ht 67.0 in | Wt 160.0 lb

## 2019-07-08 DIAGNOSIS — E663 Overweight: Secondary | ICD-10-CM

## 2019-07-08 DIAGNOSIS — E039 Hypothyroidism, unspecified: Secondary | ICD-10-CM

## 2019-07-08 DIAGNOSIS — I10 Essential (primary) hypertension: Secondary | ICD-10-CM | POA: Diagnosis not present

## 2019-07-08 DIAGNOSIS — E7800 Pure hypercholesterolemia, unspecified: Secondary | ICD-10-CM

## 2019-07-08 DIAGNOSIS — M810 Age-related osteoporosis without current pathological fracture: Secondary | ICD-10-CM | POA: Diagnosis not present

## 2019-07-08 DIAGNOSIS — I872 Venous insufficiency (chronic) (peripheral): Secondary | ICD-10-CM | POA: Diagnosis not present

## 2019-07-08 MED ORDER — LOVASTATIN 20 MG PO TABS: 20.0000 mg | ORAL_TABLET | Freq: Every day | ORAL | 3 refills | Status: DC

## 2019-07-08 MED ORDER — LEVOTHYROXINE SODIUM 25 MCG PO TABS
25.0000 ug | ORAL_TABLET | Freq: Every day | ORAL | 3 refills | Status: DC
Start: 2019-07-08 — End: 2019-12-30

## 2019-07-08 MED ORDER — TRIAMTERENE-HCTZ 37.5-25 MG PO TABS: 1.0000 | ORAL_TABLET | Freq: Every day | ORAL | 3 refills | Status: DC

## 2019-07-08 NOTE — Progress Notes (Signed)
Location:  [facility]up[facility] psychologist of Service:  Clinic (12)  Provider: Azka Steger L. Mariea Clonts, D.O., C.M.D.  Code Status: DNR Goals of Care:  Advanced Directives 07/08/2019  Does Patient Have a Medical Advance Directive? Yes  Type of Advance Directive [place]  Does patient want to make changes to medical advance directive? No - Patient declined  Copy of [place] in Chart? -     Chief Complaint  Patient presents with  . Medical Management of Chronic Issues    6 month follow-up,     HPI: Patient is a 86 y.o. female seen today for medical management of chronic diseases.    Admits she's not exercising like she used to and was thinking of buying one of the foot peddler exercise devices.  I encouraged this.  Shows me pics of her dog--she walks him twice a day.  He's a stud and she has to give him up when they call for him to make babies.    She feels heavy in her upper body especially in her breasts.  She drinks coffee, uses fat free creamer.  She does not use milk products.  She does not eat much bread at all.    She's been good about following the covid isolation rules.  She does have to go to the store and the bank today.  She did get her first covid vaccine and waiting on her second now (when due).  Has a trigger finger--right middle finger locks up.  She has to pull it up and it hurts.  She will call back after her second vaccine and we will put in a referral to Dr. Amedeo Plenty at that time at [facility].   It locks when she's opeining and closing the doggy gate at the dog park.  Uses walker when walks her dog.    Cholesterol is a little bit above goal for her of less than 100 at 108.  Thinks she had walking pneumonia a few months ago--4-5 mos ago.  She took an aleve and rested.  She had some back discomfort again like then.  She's fine now except that each am, she has sputum that is pale yellow.    Past Medical History:    Diagnosis Date  . Arthritis   . Closed left femoral fracture ([place]) 2012   s/p ORIF, Dr. Tamera Punt  . Closed left radial fracture 2012   s/p reduction  . Complication of anesthesia 08/14/2016   not able to think clear for a long time afterwards  . Essential hypertension   . Hyperlipidemia   . Hypothyroidism     Past Surgical History:  Procedure Laterality Date  . ABDOMINAL HYSTERECTOMY    . CATARACT EXTRACTION W/ INTRAOCULAR LENS  IMPLANT, BILATERAL Bilateral 2014  . ORIF FEMUR FRACTURE Left 04/08/2011   Dr. Tamera Punt  . TOTAL HIP ARTHROPLASTY Left 08/20/2016   Procedure: TOTAL HIP ARTHROPLASTY POSTERIOR REMOVE [place] NAIL;  Surgeon: Frederik Pear, MD;  Location: [facility];  Service: Orthopedics;  Laterality: Left;    Allergies  Allergen Reactions  . Amoxicillin     UNSPECIFIED REACTION   Has patient had a PCN reaction causing immediate rash, facial/tongue/throat swelling, SOB or lightheadedness with hypotension: Unknown Has patient had a PCN reaction causing severe rash involving mucus membranes or skin necrosis: Unknown Has patient had a PCN reaction that required hospitalization No Has patient had a PCN reaction occurring within the last 10 years: No If all of  the above answers are "NO", then may proceed with Cephalosporin use.     Outpatient Encounter Medications as of 07/08/2019  Medication Sig  . Ascorbic Acid (VITAMIN C) 100 MG tablet Take 100 mg by mouth daily.  Marland Kitchen aspirin EC 81 MG tablet Take 81 mg by mouth daily.  . Biotin 10000 MCG TABS Take 1 tablet by mouth daily.  . Cholecalciferol 50 MCG (2000 UT) CAPS Take 1 capsule (2,000 Units total) by mouth daily.  Marland Kitchen levothyroxine (SYNTHROID) 25 MCG tablet Take 1 tablet (25 mcg total) by mouth daily before breakfast.  . lovastatin (MEVACOR) 20 MG tablet Take 1 tablet (20 mg total) by mouth at bedtime.  . triamterene-hydrochlorothiazide (MAXZIDE-25) 37.5-25 MG tablet Take 1 tablet by mouth daily.  Marland Kitchen zinc gluconate 50 MG tablet Take  50 mg by mouth daily.   No facility-administered encounter medications on file as of 07/08/2019.    Review of Systems:  Review of Systems  Constitutional: Negative for chills, fever and malaise/fatigue.  HENT: Negative for congestion.   Eyes: Negative for blurred vision.  Respiratory: Positive for cough and sputum production. Negative for shortness of breath.   Cardiovascular: Positive for leg swelling. Negative for chest pain and palpitations.  Gastrointestinal: Negative for abdominal pain, blood in stool, constipation, diarrhea and melena.  Genitourinary: Negative for dysuria.  Musculoskeletal:       Trigger finger  Skin: Negative for itching and rash.  Neurological: Negative for dizziness and loss of consciousness.  Endo/Heme/Allergies: Bruises/bleeds easily.  Psychiatric/Behavioral: Negative for depression. The patient is not nervous/anxious and does not have insomnia.     Health Maintenance  Topic Date Due  . TETANUS/TDAP  04/02/2022  . INFLUENZA VACCINE  Completed  . DEXA SCAN  Completed  . PNA vac Low Risk Adult  Completed    Physical Exam: Vitals:   07/08/19 0951  BP: 118/70  Pulse: 83  Temp: (!) 97.5 F (36.4 C)  TempSrc: Temporal  SpO2: 97%  Weight: 160 lb (72.6 kg)  Height: 5' 7"  (1.702 m)   Body mass index is 25.06 kg/m. Physical Exam Vitals reviewed.  Constitutional:      General: She is not in acute distress.    Appearance: Normal appearance. She is not toxic-appearing.  HENT:     Head: Normocephalic and atraumatic.  Cardiovascular:     Rate and Rhythm: Normal rate and regular rhythm.  Pulmonary:     Effort: Pulmonary effort is normal.     Breath sounds: Normal breath sounds. No wheezing, rhonchi or rales.  Abdominal:     General: Bowel sounds are normal. There is no distension.  Musculoskeletal:        General: Normal range of motion.     Right lower leg: Edema present.     Left lower leg: Edema present.  Skin:    General: Skin is warm and  dry.  Neurological:     General: No focal deficit present.     Mental Status: She is alert. Mental status is at baseline.  Psychiatric:        Mood and Affect: Mood normal.     Labs reviewed: Basic Metabolic Panel: Recent Labs    11/18/18 0000 11/18/18 0800 07/02/19 0500  NA 141  --  141  K 4.1  --  3.9  CL  --   --  105  CO2  --   --  26*  BUN 25*  --  21  CREATININE 0.9  --  0.8  CALCIUM  --   --  9.5  TSH  --  3.60 4.49   Liver Function Tests: Recent Labs    11/18/18 0800 07/02/19 0500  AST 20 17  ALT 10 9  ALKPHOS 110  --   ALBUMIN  --  4.0   No results for input(s): LIPASE, AMYLASE in the last 8760 hours. No results for input(s): AMMONIA in the last 8760 hours. CBC: Recent Labs    11/18/18 0000 07/02/19 0000  WBC 5.8 5.2  HGB 12.1 12.4  HCT 36 37  PLT 235 217   Lipid Panel: Recent Labs    11/18/18 0000 07/02/19 0500  CHOL 193  --   HDL 61 69  LDLCALC 112 108  TRIG 99 112   Lab Results  Component Value Date   HGBA1C 5.7 (H) 04/10/2011    Assessment/Plan 1. Hypothyroidism, unspecified type -last tsh wnl -she reports taking consistently but did not recall the instructions about taking it first thing in the morning on an empty stomach at least 30 mins before his meds and food  2. Chronic venous insufficiency -does not like compression hose -elevate feet at rest -get back to some exercise in addition to just taking her dog to the dog park bid  3. Pure hypercholesterolemia -cont statin therapy--LDL was improved   4. Essential hypertension -bp at goal with current therapy, cont same regimen  5. Senile osteoporosis -cont ca with D, D3 and walking  6. Overweight (BMI 25.0-29.9) -get back on track with exercise routine--counseling provided  Pt requested a switch of pharmacy to [facility] so three prescriptions sent there.  Labs/tests ordered:  Cbc, bmp, tsh  Next appt:  6 mos med mgt Charmagne Buhl L. Giorgia Wahler, D.O. [facility] [address], [place] [address] Cell Phone (Mon-Fri 8am-5pm):  [phone] On Call:  [phone] & follow prompts after 5pm & weekends Office Phone:  [phone] Office Fax:  [phone]

## 2019-07-29 DIAGNOSIS — Z23 Encounter for immunization: Secondary | ICD-10-CM | POA: Diagnosis not present

## 2019-11-05 DIAGNOSIS — H52203 Unspecified astigmatism, bilateral: Secondary | ICD-10-CM | POA: Diagnosis not present

## 2019-11-05 DIAGNOSIS — H26492 Other secondary cataract, left eye: Secondary | ICD-10-CM | POA: Diagnosis not present

## 2019-11-05 DIAGNOSIS — Z961 Presence of intraocular lens: Secondary | ICD-10-CM | POA: Diagnosis not present

## 2019-11-05 DIAGNOSIS — H524 Presbyopia: Secondary | ICD-10-CM | POA: Diagnosis not present

## 2019-12-22 LAB — CBC AND DIFFERENTIAL
HCT: 38 (ref 36–46)
Hemoglobin: 12.7 (ref 12.0–16.0)
Platelets: 201 (ref 150–399)
WBC: 4.5

## 2019-12-22 LAB — BASIC METABOLIC PANEL
BUN: 22 — AB (ref 4–21)
CO2: 25 — AB (ref 13–22)
Chloride: 107 (ref 99–108)
Creatinine: 0.8 (ref 0.5–1.1)
Glucose: 87
Potassium: 4.1 (ref 3.4–5.3)
Sodium: 142 (ref 137–147)

## 2019-12-22 LAB — COMPREHENSIVE METABOLIC PANEL: Calcium: 9.8 (ref 8.7–10.7)

## 2019-12-22 LAB — TSH: TSH: 5.04 (ref 0.41–5.90)

## 2019-12-22 LAB — CBC: RBC: 4.04 (ref 3.87–5.11)

## 2019-12-24 ENCOUNTER — Telehealth: Payer: Self-pay

## 2019-12-24 NOTE — Telephone Encounter (Signed)
Per Dr.Reed (hand written notes)  1.) Blood counts ok 2.) Electrolytes and Kidneys ok 3.) TSH High, suggest missed doses vs need for more medication, please verify if taking daily. If so, increase levothyroxine to 50 mcg daily.   Spoke with patient, patient states yes, she misses doses every now and again. Patient states she will further discuss with Dr.Reed at pending appointment on 12/30/2019.   Lab abstracted, copy mailed to patient and sent for scanning.

## 2019-12-30 ENCOUNTER — Other Ambulatory Visit: Payer: Self-pay

## 2019-12-30 ENCOUNTER — Encounter: Payer: Self-pay | Admitting: Internal Medicine

## 2019-12-30 ENCOUNTER — Non-Acute Institutional Stay: Payer: Medicare Other | Admitting: Internal Medicine

## 2019-12-30 VITALS — BP 138/74 | HR 73 | Temp 97.0°F | Ht 67.0 in | Wt 166.0 lb

## 2019-12-30 DIAGNOSIS — E039 Hypothyroidism, unspecified: Secondary | ICD-10-CM | POA: Diagnosis not present

## 2019-12-30 DIAGNOSIS — M25551 Pain in right hip: Secondary | ICD-10-CM

## 2019-12-30 DIAGNOSIS — I10 Essential (primary) hypertension: Secondary | ICD-10-CM | POA: Diagnosis not present

## 2019-12-30 DIAGNOSIS — E7800 Pure hypercholesterolemia, unspecified: Secondary | ICD-10-CM

## 2019-12-30 DIAGNOSIS — I872 Venous insufficiency (chronic) (peripheral): Secondary | ICD-10-CM

## 2019-12-30 DIAGNOSIS — M810 Age-related osteoporosis without current pathological fracture: Secondary | ICD-10-CM

## 2019-12-30 MED ORDER — LEVOTHYROXINE SODIUM 25 MCG PO TABS: 25.0000 ug | ORAL_TABLET | Freq: Every day | ORAL | 3 refills | Status: DC

## 2019-12-30 MED ORDER — TRIAMTERENE-HCTZ 37.5-25 MG PO TABS: 1.0000 | ORAL_TABLET | Freq: Every day | ORAL | 3 refills | Status: DC

## 2019-12-30 MED ORDER — LOVASTATIN 20 MG PO TABS: 20.0000 mg | ORAL_TABLET | Freq: Every day | ORAL | 3 refills | Status: DC

## 2019-12-30 NOTE — Progress Notes (Signed)
Location:  Occupational psychologist of Service:  Clinic (12)  Provider: Paije Goodhart L. Mariea Clonts, D.O., C.M.D.  Code Status: DNR Goals of Care:  Advanced Directives 12/30/2019  Does Patient Have a Medical Advance Directive? Yes  Type of Advance Directive Out of facility DNR (pink MOST or yellow form);Healthcare Power of Attorney  Does patient want to make changes to medical advance directive? No - Patient declined  Copy of [place] in Chart? Yes - validated most recent copy scanned in chart (See row information)  Pre-existing out of facility DNR order (yellow form or pink MOST form) Pink MOST/Yellow Form most recent copy in chart - Physician notified to receive inpatient order     Chief Complaint  Patient presents with  . Medical Management of Chronic Issues    6 month follow up     HPI: Patient is a 86 y.o. female seen today for medical management of chronic diseases.    She thinks her right hip is starting to wear out.  She wants to ignore it for a while.  She has some pain in her lower buttock and SI region.  Same hurt that she had in her left hip before it needed to be replaced.  She can take an aleve and that helps it subside.  Not taking it all of the time.  Steroid injections didn't help on the other side before.  She is walking everyday--dog to dog park and back.  Uses her rollator walker.    Weight is back up 6 lbs.    She was not taking her medication regularly.  Not taking every day.  Reinforced need to take her three meds consistently.    Wants to complete a project before her 89th bday in Jan.  She never thought she'd reach 89.    She does get salad bar for her meal at lunch.  Usually gets a tuna sandwich or eats part of a piece of chicken and shares the rest with her dog.  Not going out to restaurants.  Daughters are coming in at the end of this week so they will see each other in the middle.    She also keeps some type of taco bell wrap  on hand--freezes them.    BP was ok today despite this fast food.  138/74.  She does c/o not being able to lose weight down to 150s like she'd like.  She's gained weight again.   Past Medical History:  Diagnosis Date  . Arthritis   . Closed left femoral fracture ([place]) 2012   s/p ORIF, Dr. Tamera Punt  . Closed left radial fracture 2012   s/p reduction  . Complication of anesthesia 08/14/2016   not able to think clear for a long time afterwards  . Essential hypertension   . Hyperlipidemia   . Hypothyroidism     Past Surgical History:  Procedure Laterality Date  . ABDOMINAL HYSTERECTOMY    . CATARACT EXTRACTION W/ INTRAOCULAR LENS  IMPLANT, BILATERAL Bilateral 2014  . ORIF FEMUR FRACTURE Left 04/08/2011   Dr. Tamera Punt  . TOTAL HIP ARTHROPLASTY Left 08/20/2016   Procedure: TOTAL HIP ARTHROPLASTY POSTERIOR REMOVE [place] NAIL;  Surgeon: Frederik Pear, MD;  Location: [facility];  Service: Orthopedics;  Laterality: Left;    Allergies  Allergen Reactions  . Amoxicillin     UNSPECIFIED REACTION   Has patient had a PCN reaction causing immediate rash, facial/tongue/throat swelling, SOB or lightheadedness with hypotension: Unknown Has patient had  a PCN reaction causing severe rash involving mucus membranes or skin necrosis: Unknown Has patient had a PCN reaction that required hospitalization No Has patient had a PCN reaction occurring within the last 10 years: No If all of the above answers are "NO", then may proceed with Cephalosporin use.     Outpatient Encounter Medications as of 12/30/2019  Medication Sig  . Ascorbic Acid (VITAMIN C) 100 MG tablet Take 100 mg by mouth daily.  Marland Kitchen aspirin EC 81 MG tablet Take 81 mg by mouth daily.  . Biotin 10000 MCG TABS Take 1 tablet by mouth daily.  . Cholecalciferol 50 MCG (2000 UT) CAPS Take 1 capsule (2,000 Units total) by mouth daily.  Marland Kitchen levothyroxine (SYNTHROID) 25 MCG tablet Take 1 tablet (25 mcg total) by mouth daily before breakfast.  . lovastatin  (MEVACOR) 20 MG tablet Take 1 tablet (20 mg total) by mouth at bedtime.  . triamterene-hydrochlorothiazide (MAXZIDE-25) 37.5-25 MG tablet Take 1 tablet by mouth daily.  Marland Kitchen zinc gluconate 50 MG tablet Take 50 mg by mouth daily.  . [DISCONTINUED] levothyroxine (SYNTHROID) 25 MCG tablet Take 1 tablet (25 mcg total) by mouth daily before breakfast.  . [DISCONTINUED] lovastatin (MEVACOR) 20 MG tablet Take 1 tablet (20 mg total) by mouth at bedtime.  . [DISCONTINUED] triamterene-hydrochlorothiazide (MAXZIDE-25) 37.5-25 MG tablet Take 1 tablet by mouth daily.   No facility-administered encounter medications on file as of 12/30/2019.    Review of Systems:  Review of Systems  Constitutional: Negative for chills and fever.  HENT: Negative for congestion.   Eyes: Negative for blurred vision.  Respiratory: Negative for shortness of breath.   Cardiovascular: Negative for chest pain, palpitations and leg swelling.  Gastrointestinal: Negative for abdominal pain, blood in stool, constipation and melena.  Genitourinary: Negative for dysuria.  Musculoskeletal: Positive for joint pain. Negative for falls.  Neurological: Negative for dizziness and loss of consciousness.  Psychiatric/Behavioral: Positive for memory loss. Negative for depression.    Health Maintenance  Topic Date Due  . INFLUENZA VACCINE  01/17/2020  . TETANUS/TDAP  04/02/2022  . DEXA SCAN  Completed  . COVID-19 Vaccine  Completed  . PNA vac Low Risk Adult  Completed    Physical Exam: Vitals:   12/30/19 1131  BP: 138/74  Pulse: 73  Temp: (!) 97 F (36.1 C)  TempSrc: Temporal  SpO2: 96%  Weight: 166 lb (75.3 kg)  Height: 5' 7"  (1.702 m)   Body mass index is 26 kg/m. Physical Exam Vitals reviewed.  Constitutional:      General: She is not in acute distress.    Appearance: Normal appearance. She is not toxic-appearing.  HENT:     Head: Normocephalic and atraumatic.  Eyes:     Extraocular Movements: Extraocular movements  intact.     Pupils: Pupils are equal, round, and reactive to light.  Cardiovascular:     Rate and Rhythm: Normal rate and regular rhythm.     Pulses: Normal pulses.     Heart sounds: Normal heart sounds.  Pulmonary:     Effort: Pulmonary effort is normal.     Breath sounds: Normal breath sounds. No wheezing, rhonchi or rales.  Abdominal:     General: Bowel sounds are normal.     Palpations: Abdomen is soft.     Tenderness: There is no guarding or rebound.  Musculoskeletal:        General: Normal range of motion.     Right lower leg: Edema present.  Left lower leg: Edema present.     Comments: Some tenderness of right SI and acetabular region  Skin:    General: Skin is warm and dry.     Comments: Venous stasis changes of legs and feet; thickened toenails  Neurological:     General: No focal deficit present.     Mental Status: She is alert and oriented to person, place, and time.  Psychiatric:        Mood and Affect: Mood normal.        Behavior: Behavior normal.     Labs reviewed: Basic Metabolic Panel: Recent Labs    07/02/19 0500 12/22/19 0800  NA 141 142  K 3.9 4.1  CL 105 107  CO2 26* 25*  BUN 21 22*  CREATININE 0.8 0.8  CALCIUM 9.5 9.8  TSH 4.49 5.04   Liver Function Tests: Recent Labs    07/02/19 0500  AST 17  ALT 9  ALBUMIN 4.0   No results for input(s): LIPASE, AMYLASE in the last 8760 hours. No results for input(s): AMMONIA in the last 8760 hours. CBC: Recent Labs    07/02/19 0000 12/22/19 0800  WBC 5.2 4.5  HGB 12.4 12.7  HCT 37 38  PLT 217 201   Lipid Panel: Recent Labs    07/02/19 0500  HDL 69  LDLCALC 108  TRIG 112   Lab Results  Component Value Date   HGBA1C 5.7 (H) 04/10/2011    Assessment/Plan 1. Hypothyroidism, unspecified type - she has not been taking the levothyroxine faithfully nor her other meds for bp and cholesterol (sometimes skipping three days in a row--not clear why)--she reports having restarted now    -recheck tsh in 6 wks - levothyroxine (SYNTHROID) 25 MCG tablet; Take 1 tablet (25 mcg total) by mouth daily before breakfast.  Dispense: 90 tablet; Refill: 3  2. Pure hypercholesterolemia -restart medication daily -f/u lipid and other basic labs before next appt in 6 mos - lovastatin (MEVACOR) 20 MG tablet; Take 1 tablet (20 mg total) by mouth at bedtime.  Dispense: 180 tablet; Refill: 3  3. Essential hypertension -bp was satisfactory today with medication in her system; however, she has not been faithful with taking it consistently so suspect her BP has been running high--counseled about importance of taking medication daily - triamterene-hydrochlorothiazide (MAXZIDE-25) 37.5-25 MG tablet; Take 1 tablet by mouth daily.  Dispense: 90 tablet; Refill: 3  4. Right hip pain -suspect DJD since pain is similar to what she had before her left hip surgery  5. Chronic venous insufficiency -elevate feet at rest, is not wearing compression stockings as recommended, but edema is looking a lot better here lately  6. Senile osteoporosis Continue on vitamin D 2000 units daily Has refused Fosamax ,Prolia options   Labs/tests ordered:  tsh in 6 wks; Cbc, bmp, liver panel, lipids, hba1c, tsh before 6 mos visit  Next appt:  6 mos med mgt  Shoua Ulloa L. Jaser Fullen, D.O. [facility] [address], [place] [address] Cell Phone (Mon-Fri 8am-5pm):  [phone] On Call:  [phone] & follow prompts after 5pm & weekends Office Phone:  [phone] Office Fax:  [phone]

## 2020-02-11 DIAGNOSIS — E039 Hypothyroidism, unspecified: Secondary | ICD-10-CM | POA: Diagnosis not present

## 2020-02-11 LAB — TSH: TSH: 4.19 (ref 0.41–5.90)

## 2020-02-17 ENCOUNTER — Telehealth: Payer: Self-pay

## 2020-02-17 ENCOUNTER — Encounter: Payer: Self-pay | Admitting: Internal Medicine

## 2020-02-17 NOTE — Telephone Encounter (Signed)
TSH 4.19 is @ goal continue same dose of levothyroxine patient called with results.

## 2020-04-22 ENCOUNTER — Encounter: Payer: Medicare Other | Admitting: Nurse Practitioner

## 2020-04-25 ENCOUNTER — Encounter: Payer: Medicare Other | Admitting: Nurse Practitioner

## 2020-04-28 ENCOUNTER — Encounter: Payer: Self-pay | Admitting: Nurse Practitioner

## 2020-04-28 ENCOUNTER — Other Ambulatory Visit: Payer: Self-pay

## 2020-04-28 ENCOUNTER — Telehealth: Payer: Self-pay

## 2020-04-28 ENCOUNTER — Ambulatory Visit (INDEPENDENT_AMBULATORY_CARE_PROVIDER_SITE_OTHER): Payer: Medicare Other | Admitting: Nurse Practitioner

## 2020-04-28 DIAGNOSIS — Z0000 Encounter for general adult medical examination without abnormal findings: Secondary | ICD-10-CM

## 2020-04-28 NOTE — Progress Notes (Signed)
Subjective:   Amber Bryant is a 86 y.o. female who presents for Medicare Annual (Subsequent) preventive examination.  Review of Systems     Cardiac Risk Factors include: advanced age (>81mn, >>51women)     Objective:    There were no vitals filed for this visit. There is no height or weight on file to calculate BMI.  Advanced Directives 04/28/2020 12/30/2019 07/08/2019 04/21/2019 04/21/2019 11/13/2017 05/15/2017  Does Patient Have a Medical Advance Directive? Yes Yes Yes Yes Yes Yes Yes  Type of Advance Directive Healthcare Power of [place] DNR (pink MOST or yellow form);Healthcare Power of [place]of [place] Does patient want to make changes to medical advance directive? No - Patient declined No - Patient declined No - Patient declined No - Patient declined No - Patient declined No - Patient declined -  Copy of HCrystalin Chart? Yes - validated most recent copy scanned in chart (See row information) Yes - validated most recent copy scanned in chart (See row information) - Yes - validated most recent copy scanned in chart (See row information) Yes - validated most recent copy scanned in chart (See row information) Yes Yes  Pre-existing out of facility DNR order (yellow form or pink MOST form) - Pink MOST/Yellow Form most recent copy in chart - Physician notified to receive inpatient order - - - - -    Current Medications (verified) Outpatient Encounter Medications as of 04/28/2020  Medication Sig  . Ascorbic Acid (VITAMIN C) 100 MG tablet Take 100 mg by mouth daily.  .Marland Kitchenaspirin EC 81 MG tablet Take 81 mg by mouth daily.  . Biotin 10000 MCG TABS Take 1 tablet by mouth daily.  . Cholecalciferol 50 MCG (2000 UT) CAPS Take 1 capsule (2,000 Units total) by mouth daily.  .Marland Kitchenlevothyroxine (SYNTHROID) 25 MCG tablet Take 1  tablet (25 mcg total) by mouth daily before breakfast.  . lovastatin (MEVACOR) 20 MG tablet Take 1 tablet (20 mg total) by mouth at bedtime.  . triamterene-hydrochlorothiazide (MAXZIDE-25) 37.5-25 MG tablet Take 1 tablet by mouth daily.  .Marland Kitchenzinc gluconate 50 MG tablet Take 50 mg by mouth daily.   No facility-administered encounter medications on file as of 04/28/2020.    Allergies (verified) Amoxicillin   History: Past Medical History:  Diagnosis Date  . Arthritis   . Closed left femoral fracture ([place] 2012   s/p ORIF, Dr. CTamera Punt . Closed left radial fracture 2012   s/p reduction  . Complication of anesthesia 08/14/2016   not able to think clear for a long time afterwards  . Essential hypertension   . Hyperlipidemia   . Hypothyroidism    Past Surgical History:  Procedure Laterality Date  . ABDOMINAL HYSTERECTOMY    . CATARACT EXTRACTION W/ INTRAOCULAR LENS  IMPLANT, BILATERAL Bilateral 2014  . ORIF FEMUR FRACTURE Left 04/08/2011   Dr. CTamera Punt . TOTAL HIP ARTHROPLASTY Left 08/20/2016   Procedure: TOTAL HIP ARTHROPLASTY POSTERIOR REMOVE [place];  Surgeon: FFrederik Pear MD;  [facility]  Service: Orthopedics;  Laterality: Left;   Family History  Problem Relation Age of Onset  . Cancer Mother   . Heart disease Father    Social History   Socioeconomic History  . Marital status: Married    Spouse name: Not on file  . Number of children: Not on file  .  Years of education: Not on file  . Highest education level: Not on file  Occupational History  . Not on file  Tobacco Use  . Smoking status: Never Smoker  . Smokeless tobacco: Never Used  Substance and Sexual Activity  . Alcohol use: No  . Drug use: No  . Sexual activity: Never  Other Topics Concern  . Not on file  Social History Narrative   Tobacco use, amount per day now: NONE   Past tobacco use, amount per day: NONE   How many years did you use tobacco: NONE   Alcohol use (drinks per week): 1   Diet:  ONGOING   Do you drink/eat things with caffeine: YES   Marital status: WIDOWED                       What year were you married? 1956   Do you live in a house, apartment, assisted living, condo, trailer, etc.? [place] ([place])   Is it one or more stories? ONE   How many persons live in your home? ONE   Do you have pets in your home?( please list) 3 DOGS   Current or past profession: PUBLISHER   Do you exercise?   YES                               Type and how often? SWIM DAILY   Do you have a living will? YES   Do you have a DNR form?   YES                                If not, do you want to discuss one?   Do you have signed POA/HPOA forms?  YES                      If so, please bring to you appointment   Social Determinants of Health   Financial Resource Strain:   . Difficulty of Paying Living Expenses: Not on file  Food Insecurity:   . Worried About Charity fundraiser in the Last Year: Not on file  . Ran Out of Food in the Last Year: Not on file  Transportation Needs:   . Lack of Transportation (Medical): Not on file  . Lack of Transportation (Non-Medical): Not on file  Physical Activity:   . Days of Exercise per Week: Not on file  . Minutes of Exercise per Session: Not on file  Stress:   . Feeling of Stress : Not on file  Social Connections:   . Frequency of Communication with Friends and Family: Not on file  . Frequency of Social Gatherings with Friends and Family: Not on file  . Attends Religious Services: Not on file  . Active Member of Clubs or Organizations: Not on file  . Attends Archivist Meetings: Not on file  . Marital Status: Not on file    Tobacco Counseling Counseling given: Not Answered   Clinical Intake:  Pre-visit preparation completed: Yes  Pain : No/denies pain     BMI - recorded: 26 Nutritional Status: BMI 25 -29 Overweight Nutritional Risks: None Diabetes: No  How often do you need to have someone help you when  you read instructions, pamphlets, or other written materials from your doctor or pharmacy?: 1 - Never  Diabetic?no  Activities of Daily Living In your present state of health, do you have any difficulty performing the following activities: 04/28/2020  Hearing? Y  Comment minimal  Vision? N  Difficulty concentrating or making decisions? N  Walking or climbing stairs? Y  Comment does not do stairs  Dressing or bathing? N  Doing errands, shopping? N  Preparing Food and eating ? N  Using the Toilet? N  In the past six months, have you accidently leaked urine? N  Do you have problems with loss of bowel control? N  Managing your Medications? N  Managing your Finances? N  Housekeeping or managing your Housekeeping? N  Some recent data might be hidden    Patient Care Team: Gayland Curry, DO as PCP - General (Geriatric Medicine) Sharyne Peach, MD as Consulting Physician (Ophthalmology) Tania Ade, MD as Consulting Physician (Orthopedic Surgery)  Indicate any recent Medical Services you may have received from other than [facility] providers in the past year (date may be approximate).     Assessment:   This is a routine wellness examination for [place].  Hearing/Vision screen  Hearing Screening   125Hz  250Hz  500Hz  1000Hz  2000Hz  3000Hz  4000Hz  6000Hz  8000Hz   Right ear:           Left ear:           Comments: Patient has no hearing problems  Vision Screening Comments: Patient has no vision problems. Patient has eye exam about 5 months ago.   Dietary issues and exercise activities discussed: Current Exercise Habits: Home exercise routine, Type of exercise: walking, Time (Minutes): 40  Goals    . Increase water and decrease salt     Pt will increase awareness of salt and water intake     . Patient Stated     Would like to live to be 89      Depression Screen PHQ 2/9 Scores 04/28/2020 12/30/2019 04/21/2019 01/07/2019 05/21/2018 05/21/2018 11/13/2017  PHQ - 2 Score 0  0 0 0 1 0 0    Fall Risk Fall Risk  04/28/2020 12/30/2019 04/21/2019 01/07/2019 05/21/2018  Falls in the past year? 0 0 0 0 1  Number falls in past yr: 0 0 - 0 0  Injury with Fall? 0 0 - 0 0  Risk for fall due to : - - - - History of fall(s);Impaired mobility  Follow up - - - - Falls evaluation completed    Any stairs in or around the home? No  If so, are there any without handrails? No  Home free of loose throw rugs in walkways, pet beds, electrical cords, etc? Yes  Adequate lighting in your home to reduce risk of falls? Yes   ASSISTIVE DEVICES UTILIZED TO PREVENT FALLS:  Life alert? No  Use of a cane, walker or w/c? Yes  Grab bars in the bathroom? Yes  Shower chair or bench in shower? Yes  Elevated toilet seat or a handicapped toilet? Yes   TIMED UP AND GO:  Was the test performed? No .    Cognitive Function: MMSE - Mini Mental State Exam 01/01/2017  Orientation to time 5  Orientation to Place 5  Registration 3  Attention/ Calculation 5  Recall 3  Language- name 2 objects 2  Language- repeat 1  Language- follow 3 step command 3  Language- read & follow direction 1  Write a sentence 1  Copy design 1  Total score 30     6CIT Screen 04/28/2020 04/21/2019  What Year? 0 points  0 points  What month? 0 points 0 points  What time? 0 points 0 points  Count back from 20 0 points 0 points  Months in reverse 2 points 4 points  Repeat phrase 0 points 0 points  Total Score 2 4    Immunizations Immunization History  Administered Date(s) Administered  . H1N1 05/27/2008  . Influenza, High Dose Seasonal PF 03/23/2016, 04/03/2017, 02/24/2018  . Influenza-Unspecified 03/18/2016, 03/19/2019  . Moderna SARS-COVID-2 Vaccination 06/30/2019, 07/28/2019    TDAP status: Due, Education has been provided regarding the importance of this vaccine. Advised may receive this vaccine at local pharmacy or Health Dept. Aware to provide a copy of the vaccination record if obtained from local  pharmacy or Health Dept. Verbalized acceptance and understanding. Flu Vaccine status: Up to date Needs pneumonia vaccines, recommended and educated on how to get Covid-19 vaccine status: Completed vaccines  Qualifies for Shingles Vaccine? Yes   Zostavax completed Yes   Shingrix Completed?: No.    Education has been provided regarding the importance of this vaccine. Patient has been advised to call insurance company to determine out of pocket expense if they have not yet received this vaccine. Advised may also receive vaccine at local pharmacy or Health Dept. Verbalized acceptance and understanding.  Screening Tests Health Maintenance  Topic Date Due  . INFLUENZA VACCINE  01/17/2020  . TETANUS/TDAP  04/02/2022  . DEXA SCAN  Completed  . COVID-19 Vaccine  Completed  . PNA vac Low Risk Adult  Completed    Health Maintenance  Health Maintenance Due  Topic Date Due  . INFLUENZA VACCINE  01/17/2020    Colorectal cancer screening: No longer required.  Mammogram status: No longer required.  Declined bone denisty  Lung Cancer Screening: (Low Dose CT Chest recommended if Age 56-80 years, 30 pack-year currently smoking OR have quit w/in 15years.) does not qualify.   Lung Cancer Screening Referral: na  Additional Screening:  Hepatitis C Screening: does not qualify; Completed na  Vision Screening: Recommended annual ophthalmology exams for early detection of glaucoma and other disorders of the eye. Is the patient up to date with their annual eye exam?  Yes  Who is the provider or what is the name of the office in which the patient attends annual eye exams? unknown If pt is not established with a provider, would they like to be referred to a provider to establish care? No .   Dental Screening: Recommended annual dental exams for proper oral hygiene  Community Resource Referral / Chronic Care Management: CRR required this visit?  No   CCM required this visit?  No      Plan:      I have personally reviewed and noted the following in the patient's chart:   . Medical and social history . Use of alcohol, tobacco or illicit drugs  . Current medications and supplements . Functional ability and status . Nutritional status . Physical activity . Advanced directives . List of other physicians . Hospitalizations, surgeries, and ER visits in previous 12 months . Vitals . Screenings to include cognitive, depression, and falls . Referrals and appointments  In addition, I have reviewed and discussed with patient certain preventive protocols, quality metrics, and best practice recommendations. A written personalized care plan for preventive services as well as general preventive health recommendations were provided to patient.     Lauree Chandler, NP   04/28/2020    Virtual Visit via Telephone Note  I connected with  Ms Caban  on 04/28/20 at 10:00 AM EST by telephone and verified that I am speaking with the correct person using two identifiers.  Location: Patient: home Provider: [facility]   I discussed the limitations, risks, security and privacy concerns of performing an evaluation and management service by telephone and the availability of in person appointments. I also discussed with the patient that there may be a patient responsible charge related to this service. The patient expressed understanding and agreed to proceed.   I discussed the assessment and treatment plan with the patient. The patient was provided an opportunity to ask questions and all were answered. The patient agreed with the plan and demonstrated an understanding of the instructions.   The patient was advised to call back or seek an in-person evaluation if the symptoms worsen or if the condition fails to improve as anticipated.  I provided 15 minutes of non-face-to-face time during this encounter.  Carlos American. Harle Battiest Avs printed and mailed

## 2020-04-28 NOTE — Patient Instructions (Signed)
Amber Bryant , Thank you for taking time to come for your Medicare Wellness Visit. I appreciate your ongoing commitment to your health goals. Please review the following plan we discussed and let me know if I can assist you in the future.   Screening recommendations/referrals: Colonoscopy aged out Mammogram aged out Bone Density you have declined this Recommended yearly ophthalmology/optometry visit for glaucoma screening and checkup Recommended yearly dental visit for hygiene and checkup  Vaccinations: Influenza vaccine up to date Pneumococcal vaccine RECOMMENDED to get at this time- can get at your local pharmacy  Tdap vaccine -RECOMMENDED to get at your local pharmacy. Shingles vaccine you have declined this at this time.    Advanced directives: up to date.  Conditions/risks identified: advanced age.  Next appointment: 1 year for AWV    Preventive Care 38 Years and Older, Female Preventive care refers to lifestyle choices and visits with your health care provider that can promote health and wellness. What does preventive care include?  A yearly physical exam. This is also called an annual well check.  Dental exams once or twice a year.  Routine eye exams. Ask your health care provider how often you should have your eyes checked.  Personal lifestyle choices, including:  Daily care of your teeth and gums.  Regular physical activity.  Eating a healthy diet.  Avoiding tobacco and drug use.  Limiting alcohol use.  Practicing safe sex.  Taking low-dose aspirin every day.  Taking vitamin and mineral supplements as recommended by your health care provider. What happens during an annual well check? The services and screenings done by your health care provider during your annual well check will depend on your age, overall health, lifestyle risk factors, and family history of disease. Counseling  Your health care provider may ask you questions about your:  Alcohol  use.  Tobacco use.  Drug use.  Emotional well-being.  Home and relationship well-being.  Sexual activity.  Eating habits.  History of falls.  Memory and ability to understand (cognition).  Work and work Statistician.  Reproductive health. Screening  You may have the following tests or measurements:  Height, weight, and BMI.  Blood pressure.  Lipid and cholesterol levels. These may be checked every 5 years, or more frequently if you are over 65 years old.  Skin check.  Lung cancer screening. You may have this screening every year starting at age 6 if you have a 30-pack-year history of smoking and currently smoke or have quit within the past 15 years.  Fecal occult blood test (FOBT) of the stool. You may have this test every year starting at age 28.  Flexible sigmoidoscopy or colonoscopy. You may have a sigmoidoscopy every 5 years or a colonoscopy every 10 years starting at age 75.  Hepatitis C blood test.  Hepatitis B blood test.  Sexually transmitted disease (STD) testing.  Diabetes screening. This is done by checking your blood sugar (glucose) after you have not eaten for a while (fasting). You may have this done every 1-3 years.  Bone density scan. This is done to screen for osteoporosis. You may have this done starting at age 6.  Mammogram. This may be done every 1-2 years. Talk to your health care provider about how often you should have regular mammograms. Talk with your health care provider about your test results, treatment options, and if necessary, the need for more tests. Vaccines  Your health care provider may recommend certain vaccines, such as:  Influenza vaccine. This is  recommended every year.  Tetanus, diphtheria, and acellular pertussis (Tdap, Td) vaccine. You may need a Td booster every 10 years.  Zoster vaccine. You may need this after age 14.  Pneumococcal 13-valent conjugate (PCV13) vaccine. One dose is recommended after age  53.  Pneumococcal polysaccharide (PPSV23) vaccine. One dose is recommended after age 65. Talk to your health care provider about which screenings and vaccines you need and how often you need them. This information is not intended to replace advice given to you by your health care provider. Make sure you discuss any questions you have with your health care provider. Document Released: 07/01/2015 Document Revised: 02/22/2016 Document Reviewed: 04/05/2015 Elsevier Interactive Patient Education  2017 [place] Prevention in the Home Falls can cause injuries. They can happen to people of all ages. There are many things you can do to make your home safe and to help prevent falls. What can I do on the outside of my home?  Regularly fix the edges of walkways and driveways and fix any cracks.  Remove anything that might make you trip as you walk through a door, such as a raised step or threshold.  Trim any bushes or trees on the path to your home.  Use bright outdoor lighting.  Clear any walking paths of anything that might make someone trip, such as rocks or tools.  Regularly check to see if handrails are loose or broken. Make sure that both sides of any steps have handrails.  Any raised decks and porches should have guardrails on the edges.  Have any leaves, snow, or ice cleared regularly.  Use sand or salt on walking paths during winter.  Clean up any spills in your garage right away. This includes oil or grease spills. What can I do in the bathroom?  Use night lights.  Install grab bars by the toilet and in the tub and shower. Do not use towel bars as grab bars.  Use non-skid mats or decals in the tub or shower.  If you need to sit down in the shower, use a plastic, non-slip stool.  Keep the floor dry. Clean up any water that spills on the floor as soon as it happens.  Remove soap buildup in the tub or shower regularly.  Attach bath mats securely with double-sided  non-slip rug tape.  Do not have throw rugs and other things on the floor that can make you trip. What can I do in the bedroom?  Use night lights.  Make sure that you have a light by your bed that is easy to reach.  Do not use any sheets or blankets that are too big for your bed. They should not hang down onto the floor.  Have a firm chair that has side arms. You can use this for support while you get dressed.  Do not have throw rugs and other things on the floor that can make you trip. What can I do in the kitchen?  Clean up any spills right away.  Avoid walking on wet floors.  Keep items that you use a lot in easy-to-reach places.  If you need to reach something above you, use a strong step stool that has a grab bar.  Keep electrical cords out of the way.  Do not use floor polish or wax that makes floors slippery. If you must use wax, use non-skid floor wax.  Do not have throw rugs and other things on the floor that can make you trip.  What can I do with my stairs?  Do not leave any items on the stairs.  Make sure that there are handrails on both sides of the stairs and use them. Fix handrails that are broken or loose. Make sure that handrails are as long as the stairways.  Check any carpeting to make sure that it is firmly attached to the stairs. Fix any carpet that is loose or worn.  Avoid having throw rugs at the top or bottom of the stairs. If you do have throw rugs, attach them to the floor with carpet tape.  Make sure that you have a light switch at the top of the stairs and the bottom of the stairs. If you do not have them, ask someone to add them for you. What else can I do to help prevent falls?  Wear shoes that:  Do not have high heels.  Have rubber bottoms.  Are comfortable and fit you well.  Are closed at the toe. Do not wear sandals.  If you use a stepladder:  Make sure that it is fully opened. Do not climb a closed stepladder.  Make sure that both  sides of the stepladder are locked into place.  Ask someone to hold it for you, if possible.  Clearly mark and make sure that you can see:  Any grab bars or handrails.  First and last steps.  Where the edge of each step is.  Use tools that help you move around (mobility aids) if they are needed. These include:  Canes.  Walkers.  Scooters.  Crutches.  Turn on the lights when you go into a dark area. Replace any light bulbs as soon as they burn out.  Set up your furniture so you have a clear path. Avoid moving your furniture around.  If any of your floors are uneven, fix them.  If there are any pets around you, be aware of where they are.  Review your medicines with your doctor. Some medicines can make you feel dizzy. This can increase your chance of falling. Ask your doctor what other things that you can do to help prevent falls. This information is not intended to replace advice given to you by your health care provider. Make sure you discuss any questions you have with your health care provider. Document Released: 03/31/2009 Document Revised: 11/10/2015 Document Reviewed: 07/09/2014 Elsevier Interactive Patient Education  2017 [employer].

## 2020-04-28 NOTE — Progress Notes (Signed)
This service is provided via telemedicine  No vital signs collected/recorded due to the encounter was a telemedicine visit.   Location of patient (ex: home, work):  Home  Patient consents to a telephone visit:  Yes, see encounter dated 04/28/2020  Location of the provider (ex: office, home):  [place]  Name of any referring provider:  Reed,Tiffany, DO  Names of all persons participating in the telemedicine service and their role in the encounter:  Sherrie Mustache, Nurse Practitioner, Carroll Kinds, CMA, and patient.   Time spent on call:  8 minutes with medical assistant

## 2020-04-28 NOTE — Telephone Encounter (Signed)
[name], himmelberger are scheduled for a virtual visit with your provider today.    Just as we do with appointments in the office, we must obtain your consent to participate.  Your consent will be active for this visit and any virtual visit you may have with one of our providers in the next 365 days.    If you have a MyChart account, I can also send a copy of this consent to you electronically.  All virtual visits are billed to your insurance company just like a traditional visit in the office.  As this is a virtual visit, video technology does not allow for your provider to perform a traditional examination.  This may limit your provider's ability to fully assess your condition.  If your provider identifies any concerns that need to be evaluated in person or the need to arrange testing such as labs, EKG, etc, we will make arrangements to do so.    Although advances in technology are sophisticated, we cannot ensure that it will always work on either your end or our end.  If the connection with a video visit is poor, we may have to switch to a telephone visit.  With either a video or telephone visit, we are not always able to ensure that we have a secure connection.   I need to obtain your verbal consent now.   Are you willing to proceed with your visit today?   Amber Bryant has provided verbal consent on 04/28/2020 for a virtual visit (video or telephone).   Carroll Kinds, CMA 04/28/2020  11:00 AM

## 2020-05-03 DIAGNOSIS — Z23 Encounter for immunization: Secondary | ICD-10-CM | POA: Diagnosis not present

## 2020-06-27 ENCOUNTER — Telehealth: Payer: Self-pay | Admitting: Internal Medicine

## 2020-06-27 NOTE — Telephone Encounter (Signed)
I called three times, the line was busy! I will try to follow up tomorrow.

## 2020-06-27 NOTE — Telephone Encounter (Signed)
Patient has an appointment on 06/29/20 with Dr. Mariea Clonts at [facility].  She has a question regarding the TSH labs.  Please give patient a call.  Thank you   [employer]

## 2020-06-27 NOTE — Telephone Encounter (Signed)
Ok great.  She had a question about it in original message.  Can you call her and find out the question?

## 2020-06-27 NOTE — Telephone Encounter (Signed)
Does she have labs before?  I know she canceled her previous appt a few weeks ago.

## 2020-06-27 NOTE — Telephone Encounter (Signed)
She has a TSH scheduled for 06/28/2020, but prior to that her last one was 02/11/2020.

## 2020-06-28 DIAGNOSIS — E039 Hypothyroidism, unspecified: Secondary | ICD-10-CM | POA: Diagnosis not present

## 2020-06-28 LAB — TSH: TSH: 1.8 (ref 0.41–5.90)

## 2020-06-29 ENCOUNTER — Non-Acute Institutional Stay: Payer: Medicare Other | Admitting: Internal Medicine

## 2020-06-29 ENCOUNTER — Encounter: Payer: Self-pay | Admitting: Internal Medicine

## 2020-06-29 ENCOUNTER — Other Ambulatory Visit: Payer: Self-pay

## 2020-06-29 VITALS — BP 142/82 | HR 85 | Temp 97.3°F | Ht 67.0 in | Wt 165.8 lb

## 2020-06-29 DIAGNOSIS — R197 Diarrhea, unspecified: Secondary | ICD-10-CM

## 2020-06-29 DIAGNOSIS — I10 Essential (primary) hypertension: Secondary | ICD-10-CM

## 2020-06-29 DIAGNOSIS — E039 Hypothyroidism, unspecified: Secondary | ICD-10-CM | POA: Diagnosis not present

## 2020-06-29 DIAGNOSIS — K921 Melena: Secondary | ICD-10-CM | POA: Diagnosis not present

## 2020-06-29 DIAGNOSIS — I872 Venous insufficiency (chronic) (peripheral): Secondary | ICD-10-CM | POA: Diagnosis not present

## 2020-06-29 NOTE — Progress Notes (Signed)
Location:  Occupational psychologist of Service:  Clinic (12)  Provider: Lilyann Gravelle L. Mariea Clonts, D.O., C.M.D.  Code Status: DNR Goals of Care:  Advanced Directives 04/28/2020  Does Patient Have a Medical Advance Directive? Yes  Type of Advance Directive [place]  Does patient want to make changes to medical advance directive? No - Patient declined  Copy of Miguel Barrera in Chart? Yes - validated most recent copy scanned in chart (See row information)  Pre-existing out of facility DNR order (yellow form or pink MOST form) -     Chief Complaint  Patient presents with  . Medical Management of Chronic Issues    Medical Management of Chronic Issues. 6 Month Follow up    HPI: Patient is a 87 y.o. female seen today for medical management of chronic diseases.    She's been fine.  Says she's turned another year and she's struggling to deal with that.  She's fine now.  Says where does she go from here?    She says she has a food fetish.  Never knows what to eat.  She was a weight Surveyor, mining in the past.  She is up 5 lbs with multiple layers.  She's been just under 160.  She's now here 4.5 yrs.  Says she's bombarded with food.  She's mostly vegetarian.  Is not enticed to order meals.  Liked going out instead.  She had two parties around her bday--breakfast and evening.  She had something with a lot of tomato sauce.  The next day she had true diarrhea.  She's never had that in her adult life.  Says if she ever saw blood when wiping, she'd be horrified.  She took that as a sign that she should not eat the food here.  Wonders if they're put metamucil in the food.  Says they're charged for their meals $18-25.  Will get like a tuna fish sandwich 1-2 times a week.  A bit after the diarrhea started, she got cramps.  Then she had a lot of gas.  Did not otherwise feel bad.  Happened at 2-3am.  Then she was fine after the diarrhea resolved.  Happened 1/5,  again 1/7, maybe once more.  Sometimes had mucus in it.  At first, it was bright red.  Says she was told she was ok and see someone when you can.  Her daughter was with her that weekend for her bday.  She treated it lightly also.  About 3 days ago, her urine looked fine.  She had some grumbling--there was very little blood there when she wiped then.  Nothing the past couple of days.  Does say that 2-3 wks prior to the diarrhea, she had to strain.  She thinks she had a tear in her anus.  She's being very careful what she eats since.  She eats a bowl of oatmeal and a poached egg on it.  She'll later have salmon, soup or something from [facility].  Avoiding veggies and fruit at the moment.    She feels fine now and is hungry.  She talks about a friend of hers who has had a colon cancer diagnosis and is on treatment.  TSH was normal.    Past Medical History:  Diagnosis Date  . Arthritis   . Closed left femoral fracture ([place]) 2012   s/p ORIF, Dr. Tamera Punt  . Closed left radial fracture 2012   s/p reduction  . Complication of anesthesia  08/14/2016   not able to think clear for a long time afterwards  . Essential hypertension   . Hyperlipidemia   . Hypothyroidism     Past Surgical History:  Procedure Laterality Date  . ABDOMINAL HYSTERECTOMY    . CATARACT EXTRACTION W/ INTRAOCULAR LENS  IMPLANT, BILATERAL Bilateral 2014  . ORIF FEMUR FRACTURE Left 04/08/2011   Dr. Tamera Punt  . TOTAL HIP ARTHROPLASTY Left 08/20/2016   Procedure: TOTAL HIP ARTHROPLASTY POSTERIOR REMOVE [place] NAIL;  Surgeon: Frederik Pear, MD;  Location: [place];  Service: Orthopedics;  Laterality: Left;    Allergies  Allergen Reactions  . Amoxicillin     UNSPECIFIED REACTION   Has patient had a PCN reaction causing immediate rash, facial/tongue/throat swelling, SOB or lightheadedness with hypotension: Unknown Has patient had a PCN reaction causing severe rash involving mucus membranes or skin necrosis: Unknown Has patient had a PCN  reaction that required hospitalization No Has patient had a PCN reaction occurring within the last 10 years: No If all of the above answers are "NO", then may proceed with Cephalosporin use.     Outpatient Encounter Medications as of 06/29/2020  Medication Sig  . Ascorbic Acid (VITAMIN C) 100 MG tablet Take 100 mg by mouth daily.  Marland Kitchen aspirin EC 81 MG tablet Take 81 mg by mouth daily.  . Biotin 10000 MCG TABS Take 1 tablet by mouth daily.  . Cholecalciferol 50 MCG (2000 UT) CAPS Take 1 capsule (2,000 Units total) by mouth daily.  Marland Kitchen levothyroxine (SYNTHROID) 25 MCG tablet Take 1 tablet (25 mcg total) by mouth daily before breakfast.  . lovastatin (MEVACOR) 20 MG tablet Take 1 tablet (20 mg total) by mouth at bedtime.  . triamterene-hydrochlorothiazide (MAXZIDE-25) 37.5-25 MG tablet Take 1 tablet by mouth daily.  Marland Kitchen zinc gluconate 50 MG tablet Take 50 mg by mouth daily.   No facility-administered encounter medications on file as of 06/29/2020.    Review of Systems:  Review of Systems  Constitutional: Negative for chills, fever and malaise/fatigue.  HENT: Positive for hearing loss. Negative for congestion and sore throat.   Eyes: Negative for blurred vision.  Respiratory: Negative for cough and shortness of breath.   Cardiovascular: Positive for leg swelling. Negative for chest pain and palpitations.  Gastrointestinal: Positive for blood in stool and diarrhea. Negative for abdominal pain, constipation, melena, nausea and vomiting.  Genitourinary: Negative for dysuria.  Musculoskeletal: Negative for falls, joint pain and myalgias.       Took aleve when she had diarrhea but she's not sure why she did now  Skin: Negative for itching and rash.  Neurological: Negative for dizziness and loss of consciousness.       Not using assistive device, a bit unsteady when getting started  Psychiatric/Behavioral: Positive for memory loss. Negative for depression. The patient is not nervous/anxious and does  not have insomnia.        Paranoia about food having metamucil in it (but then says she doesn't really think that's possible?)    Health Maintenance  Topic Date Due  . TETANUS/TDAP  04/02/2022  . INFLUENZA VACCINE  Completed  . DEXA SCAN  Completed  . COVID-19 Vaccine  Completed  . PNA vac Low Risk Adult  Completed    Physical Exam: Vitals:   06/29/20 1106  BP: (!) 142/82  Pulse: 85  Temp: (!) 97.3 F (36.3 C)  TempSrc: Skin  SpO2: 97%  Weight: 165 lb 12.8 oz (75.2 kg)  Height: 5\' 7"  (1.702 m)  Body mass index is 25.97 kg/m. Physical Exam Vitals reviewed.  Constitutional:      General: She is not in acute distress.    Appearance: Normal appearance. She is not toxic-appearing.  HENT:     Head: Normocephalic and atraumatic.  Eyes:     Conjunctiva/sclera: Conjunctivae normal.     Pupils: Pupils are equal, round, and reactive to light.  Cardiovascular:     Rate and Rhythm: Normal rate and regular rhythm.     Pulses: Normal pulses.     Heart sounds: Normal heart sounds.  Pulmonary:     Effort: Pulmonary effort is normal.     Breath sounds: Normal breath sounds. No wheezing, rhonchi or rales.  Abdominal:     General: Bowel sounds are normal. There is no distension.     Palpations: Abdomen is soft. There is no mass.     Tenderness: There is no abdominal tenderness. There is no guarding or rebound.     Hernia: No hernia is present.  Genitourinary:    Rectum: Normal. Guaiac result negative.     Comments: Erythema in gluteal crease with some contact dermatitis; no hemorrhoids and only tiny amount of light brown stool present in rectal vault with negative hemoccult Musculoskeletal:        General: Normal range of motion.     Right lower leg: Edema present.     Left lower leg: Edema present.  Skin:    General: Skin is warm and dry.  Neurological:     General: No focal deficit present.     Mental Status: She is alert and oriented to person, place, and time.     Gait:  Gait abnormal.     Comments: some short term memory loss  Psychiatric:        Mood and Affect: Mood normal.     Labs reviewed: Basic Metabolic Panel: Recent Labs    07/02/19 0500 12/22/19 0800 02/11/20 0000 06/28/20 0000  NA 141 142  --   --   K 3.9 4.1  --   --   CL 105 107  --   --   CO2 26* 25*  --   --   BUN 21 22*  --   --   CREATININE 0.8 0.8  --   --   CALCIUM 9.5 9.8  --   --   TSH 4.49 5.04 4.19 1.80   Liver Function Tests: Recent Labs    07/02/19 0500  AST 17  ALT 9  ALBUMIN 4.0   No results for input(s): LIPASE, AMYLASE in the last 8760 hours. No results for input(s): AMMONIA in the last 8760 hours. CBC: Recent Labs    07/02/19 0000 12/22/19 0800  WBC 5.2 4.5  HGB 12.4 12.7  HCT 37 38  PLT 217 201   Lipid Panel: Recent Labs    07/02/19 0500  HDL 69  LDLCALC 108  TRIG 112   Lab Results  Component Value Date   HGBA1C 5.7 (H) 04/10/2011    Procedures since last visit: No results found.  Assessment/Plan 1. Diarrhea, unspecified type -resolved -? If she had covid a week ago that caused her diarrhea or just some other enteritis  -will check cbc, cmp in am  2. Blood in stool, frank -suspect she had a small fissure develop from the diarrhea she had -is better now and will watch her stool appearance -FOBT negative, but she had minimal stool in vault -check cbc, cmp in am  3.  Hypothyroidism, unspecified type -tsh wnl  -cont current levothyroxine and monitor  4.  HTN:  bp borderline; she takes a lot of convincing with med changes and eats out a lot so follows high sodium diet; if still over 140 next time, will need change  5.  Chronic venous insufficiency:  Refuses to wear compression hose -is on hctz   Labs/tests ordered:  Cbc, cmp in am Next appt:  6 mos med mgt  Lonnel Gjerde L. Rayshon Albaugh, D.O. [facility] [address], [place] [address] Cell Phone (Mon-Fri 8am-5pm):  [phone] On  Call:  [phone] & follow prompts after 5pm & weekends Office Phone:  [phone] Office Fax:  [phone]

## 2020-06-30 DIAGNOSIS — K921 Melena: Secondary | ICD-10-CM | POA: Diagnosis not present

## 2020-06-30 DIAGNOSIS — R197 Diarrhea, unspecified: Secondary | ICD-10-CM | POA: Diagnosis not present

## 2020-06-30 LAB — BASIC METABOLIC PANEL
BUN: 18 (ref 4–21)
CO2: 24 — AB (ref 13–22)
Chloride: 103 (ref 99–108)
Creatinine: 0.9 (ref 0.5–1.1)
Glucose: 118
Potassium: 3.9 (ref 3.4–5.3)
Sodium: 140 (ref 137–147)

## 2020-06-30 LAB — CBC AND DIFFERENTIAL
HCT: 37 (ref 36–46)
Hemoglobin: 12.5 (ref 12.0–16.0)
Platelets: 277 (ref 150–399)
WBC: 7.2

## 2020-06-30 LAB — COMPREHENSIVE METABOLIC PANEL
Calcium: 9.5 (ref 8.7–10.7)
Globulin: 2.7

## 2020-06-30 LAB — HEPATIC FUNCTION PANEL
ALT: 10 (ref 7–35)
AST: 17 (ref 13–35)

## 2020-06-30 LAB — CBC: RBC: 4.09 (ref 3.87–5.11)

## 2020-07-06 ENCOUNTER — Telehealth: Payer: Self-pay

## 2020-07-06 DIAGNOSIS — R197 Diarrhea, unspecified: Secondary | ICD-10-CM

## 2020-07-06 DIAGNOSIS — K921 Melena: Secondary | ICD-10-CM

## 2020-07-06 NOTE — Telephone Encounter (Signed)
Labs received today.  Electrolytes, liver tests, kidneys are normal.  Blood counts remained in normal range.  Due to recurrence of possible bleeding, I recommend she see GI for further evaluation.  Please put in the referral for [facility] and I will sign it.

## 2020-07-06 NOTE — Telephone Encounter (Signed)
Patient stated she saw you last week and had labs, she has not heard back about the lab results and is still bleeding and have diarrhea. She stated she needs to be seen today, I offered her an appointment at [facility] but she said she can not go there it  would be a very traumatic experience for her. please advise?

## 2020-07-06 NOTE — Telephone Encounter (Signed)
Lattie Haw can you please put in this referral for [facility]. Thank you

## 2020-07-06 NOTE — Telephone Encounter (Signed)
I'll be glad to send referral over to [facility], once Dr Mariea Clonts creates the referral.  Let me know when its completed & I'll get it sent over.  Thanks, Vilinda Blanks

## 2020-07-07 ENCOUNTER — Telehealth: Payer: Self-pay | Admitting: Gastroenterology

## 2020-07-11 NOTE — Addendum Note (Signed)
Addended by: Hollace Kinnier L on: 07/11/2020 12:07 PM   Modules accepted: Orders

## 2020-07-11 NOTE — Telephone Encounter (Signed)
I had asked for the CMA to enter the referral for me to sign hence the message before.  She has possibly GI bleeding.  My last note describes the previous episode but it sounds like it recurred per the [facility] nurse.

## 2020-07-12 NOTE — Telephone Encounter (Signed)
Pt schedule 07/26/20 with [facility]/LM

## 2020-07-22 ENCOUNTER — Telehealth: Payer: Self-pay

## 2020-07-22 NOTE — Telephone Encounter (Signed)
Heather from [employer] called and stated that she would like to put Ms. Lofaro in [facility]  because she is weak, not eating. Orders were faxed over for your signature in which I can use your stamp and faxed back to her.

## 2020-07-26 ENCOUNTER — Other Ambulatory Visit: Payer: Self-pay

## 2020-07-26 ENCOUNTER — Other Ambulatory Visit (INDEPENDENT_AMBULATORY_CARE_PROVIDER_SITE_OTHER): Payer: Medicare Other

## 2020-07-26 ENCOUNTER — Encounter: Payer: Self-pay | Admitting: Physician Assistant

## 2020-07-26 ENCOUNTER — Telehealth: Payer: Self-pay | Admitting: Physician Assistant

## 2020-07-26 ENCOUNTER — Ambulatory Visit (INDEPENDENT_AMBULATORY_CARE_PROVIDER_SITE_OTHER): Payer: Medicare Other | Admitting: Physician Assistant

## 2020-07-26 VITALS — BP 94/60 | HR 88 | Ht 67.0 in | Wt 154.5 lb

## 2020-07-26 DIAGNOSIS — R1030 Lower abdominal pain, unspecified: Secondary | ICD-10-CM

## 2020-07-26 DIAGNOSIS — R197 Diarrhea, unspecified: Secondary | ICD-10-CM | POA: Diagnosis not present

## 2020-07-26 DIAGNOSIS — K625 Hemorrhage of anus and rectum: Secondary | ICD-10-CM

## 2020-07-26 LAB — COMPREHENSIVE METABOLIC PANEL
ALT: 15 U/L (ref 0–35)
AST: 15 U/L (ref 0–37)
Albumin: 3.4 g/dL — ABNORMAL LOW (ref 3.5–5.2)
Alkaline Phosphatase: 169 U/L — ABNORMAL HIGH (ref 39–117)
BUN: 16 mg/dL (ref 6–23)
CO2: 28 mEq/L (ref 19–32)
Calcium: 9.5 mg/dL (ref 8.4–10.5)
Chloride: 96 mEq/L (ref 96–112)
Creatinine, Ser: 0.99 mg/dL (ref 0.40–1.20)
GFR: 50.35 mL/min — ABNORMAL LOW (ref >60.00)
Glucose, Bld: 110 mg/dL — ABNORMAL HIGH (ref 70–99)
Potassium: 3.3 mEq/L — ABNORMAL LOW (ref 3.5–5.1)
Sodium: 135 mEq/L (ref 135–145)
Total Bilirubin: 0.8 mg/dL (ref 0.2–1.2)
Total Protein: 7.4 g/dL (ref 6.0–8.3)

## 2020-07-26 LAB — C-REACTIVE PROTEIN: CRP: 13.5 mg/dL (ref 0.5–20.0)

## 2020-07-26 LAB — CBC WITH DIFFERENTIAL/PLATELET
Basophils Absolute: 0 10*3/uL (ref 0.0–0.1)
Basophils Relative: 0.4 % (ref 0.0–3.0)
Eosinophils Absolute: 0.1 10*3/uL (ref 0.0–0.7)
Eosinophils Relative: 1 % (ref 0.0–5.0)
HCT: 35.4 % — ABNORMAL LOW (ref 36.0–46.0)
Hemoglobin: 11.8 g/dL — ABNORMAL LOW (ref 12.0–15.0)
Lymphocytes Relative: 15.9 % (ref 12.0–46.0)
Lymphs Abs: 1.8 10*3/uL (ref 0.7–4.0)
MCHC: 33.3 g/dL (ref 30.0–36.0)
MCV: 91 fl (ref 78.0–100.0)
Monocytes Absolute: 1.2 10*3/uL — ABNORMAL HIGH (ref 0.1–1.0)
Monocytes Relative: 10.6 % (ref 3.0–12.0)
Neutro Abs: 8.1 10*3/uL — ABNORMAL HIGH (ref 1.4–7.7)
Neutrophils Relative %: 72.1 % (ref 43.0–77.0)
Platelets: 559 10*3/uL — ABNORMAL HIGH (ref 150.0–400.0)
RBC: 3.89 Mil/uL (ref 3.87–5.11)
RDW: 13.7 % (ref 11.5–15.5)
WBC: 11.3 10*3/uL — ABNORMAL HIGH (ref 4.0–10.5)

## 2020-07-26 LAB — SEDIMENTATION RATE: Sed Rate: 119 mm/hr — ABNORMAL HIGH (ref 0–30)

## 2020-07-26 MED ORDER — POTASSIUM CHLORIDE CRYS ER 20 MEQ PO TBCR: 20.0000 meq | EXTENDED_RELEASE_TABLET | Freq: Two times a day (BID) | ORAL | 0 refills | Status: DC

## 2020-07-26 NOTE — Patient Instructions (Signed)
If you are age 85 or older, your body mass index should be between 23-30. Your Body mass index is 24.2 kg/m. If this is out of the aforementioned range listed, please consider follow up with your Primary Care Provider.  If you are age 62 or younger, your body mass index should be between 19-25. Your Body mass index is 24.2 kg/m. If this is out of the aformentioned range listed, please consider follow up with your Primary Care Provider.   Your provider has requested that you go to the basement level for lab work before leaving today. Press "B" on the elevator. The lab is located at the first door on the left as you exit the elevator.  You have been scheduled for a CT scan of the abdomen and pelvis at [facility], 1st floor Radiology. You are scheduled on 07/29/20  at 6:00 pm. You should arrive 15 minutes prior to your appointment time for registration.  Please pick up 2 bottles of contrast from [facility] at least 3 days prior to your scan. The solution may taste better if refrigerated, but do NOT add ice or any other liquid to this solution. Shake well before drinking.   Please follow the written instructions below on the day of your exam:   1) Do not eat anything after 2:00 pm (4 hours prior to your test)   2) Drink 1 bottle of contrast @ 4:00 pm (2 hours prior to your exam)  Remember to shake well before drinking and do NOT pour over ice.     Drink 1 bottle of contrast @ 5:00 pm (1 hour prior to your exam)   You may take any medications as prescribed with a small amount of water, if necessary. If you take any of the following medications: METFORMIN, GLUCOPHAGE, GLUCOVANCE, AVANDAMET, RIOMET, FORTAMET, [place] MET, JANUMET, GLUMETZA or METAGLIP, you MAY be asked to HOLD this medication 48 hours AFTER the exam.   The purpose of you drinking the oral contrast is to aid in the visualization of your intestinal tract. The contrast solution may cause some diarrhea. Depending on your  individual set of symptoms, you may also receive an intravenous injection of x-ray contrast/dye. Plan on being at [facility] for 45 minutes or longer, depending on the type of exam you are having performed.   If you have any questions regarding your exam or if you need to reschedule, you may call [facility] at [phone] between the hours of 8:00 am and 5:00 pm, Monday-Friday.   You can take OTC Imodium 1 tablet every morning for diarrhea.  Push fluids, 6-8 glasses per day.  Bland soft foods.  Use Lactose Free Boost, 2 per day.  Thank you for entrusting me with your care and choosing [facility].  Amy Esterwood, PA-C   Bland Diet A bland diet consists of foods that are often soft and do not have a lot of fat, fiber, or extra seasonings. Foods without fat, fiber, or seasoning are easier for the body to digest. They are also less likely to irritate your mouth, throat, stomach, and other parts of your digestive system. A bland diet is sometimes called a BRAT diet. What is my plan? Your health care provider or food and nutrition specialist (dietitian) may recommend specific changes to your diet to prevent symptoms or to treat your symptoms. These changes may include:  Eating small meals often.  Cooking food until it is soft enough to chew easily.  Chewing  your food well.  Drinking fluids slowly.  Not eating foods that are very spicy, sour, or fatty.  Not eating citrus fruits, such as oranges and grapefruit. What do I need to know about this diet?  Eat a variety of foods from the bland diet food list.  Do not follow a bland diet longer than needed.  Ask your health care provider whether you should take vitamins or supplements. What foods can I eat? Grains Hot cereals, such as cream of wheat. Rice. Bread, crackers, or tortillas made from refined white flour.   Vegetables Canned or cooked vegetables. Mashed or boiled potatoes. Fruits Bananas. Applesauce.  Other types of cooked or canned fruit with the skin and seeds removed, such as canned peaches or pears.   Meats and other proteins Scrambled eggs. Creamy peanut butter or other nut butters. Lean, well-cooked meats, such as chicken or fish. Tofu. Soups or broths.   Dairy Low-fat dairy products, such as milk, cottage cheese, or yogurt. Beverages Water. Herbal tea. Apple juice.   Fats and oils Mild salad dressings. Canola or olive oil. Sweets and desserts Pudding. Custard. Fruit gelatin. Ice cream. The items listed above may not be a complete list of recommended foods and beverages. Contact a dietitian for more options. What foods are not recommended? Grains Whole grain breads and cereals. Vegetables Raw vegetables. Fruits Raw fruits, especially citrus, berries, or dried fruits. Dairy Whole fat dairy foods. Beverages Caffeinated drinks. Alcohol. Seasonings and condiments Strongly flavored seasonings or condiments. Hot sauce. Salsa. Other foods Spicy foods. Fried foods. Sour foods, such as pickled or fermented foods. Foods with high sugar content. Foods high in fiber. The items listed above may not be a complete list of foods and beverages to avoid. Contact a dietitian for more information. Summary  A bland diet consists of foods that are often soft and do not have a lot of fat, fiber, or extra seasonings.  Foods without fat, fiber, or seasoning are easier for the body to digest.  Check with your health care provider to see how long you should follow this diet plan. It is not meant to be followed for long periods. This information is not intended to replace advice given to you by your health care provider. Make sure you discuss any questions you have with your health care provider. Document Revised: 07/03/2017 Document Reviewed: 07/03/2017 Elsevier Patient Education  2021 [employer].

## 2020-07-26 NOTE — Telephone Encounter (Signed)
Called daughter back and reviewed the notes of her Mom's office visit 07/26/20,  and answered questions about her Mom's scheduled  CT.

## 2020-07-26 NOTE — Progress Notes (Signed)
Subjective:    Patient ID: Amber Bryant, female    DOB: July 16, 1928, 87 y.o.   MRN: [id number]  HPI Amber Bryant is a 87 year old white female, new to GI today, referred by Hollace Kinnier, DO for evaluation of diarrhea rectal bleeding and weight loss.  Patient is a resident at [facility], currently in independent living. She denies any prior GI evaluation. She does have history of arthritis, hyperlipidemia, hypothyroidism and hypertension.  She reports that her current symptoms started on January 5, when she developed diarrhea and abdominal cramping in the middle of the night, she eventually started noticing some bright red blood associated with the diarrhea.  Ever since that time she has continued to have loose stools usually 1 or 2 bowel movements per day, and says that over the past week or so stools have become semiformed.  She continues to see red blood with her bowel movements.  She reports an episode of straining a couple of weeks prior to acute onset of symptoms and wonders if she developed a tear however she has no recollection of any anal rectal discomfort and is not having any anal rectal discomfort now.  She has had some nausea over the past few days but no vomiting.  No documented fever or chills.  She generally feels poorly and does not have any energy.  She has lost about 11 pounds over the past 3 weeks.  She says she is not been eating or drinking much, because this causes her to go to the bathroom, and she does not have much appetite.  She also seems frustrated because she says she does not know what to eat. She was placed in assisted living this past weekend but only stayed for a couple of days, was not happy with her care and could not receive IV fluids so she went back to her private unit. She was seen by Dr. Hollace Kinnier on 06/29/2020 had labs done showing WBC of 7.2 hemoglobin 12.5 hematocrit of 37 [place] was unremarkable TSH 1.8. Patient reports that she tried drinking some boost but is  lactose intolerant and was afraid that it had lactose in it.  She has also tried Gatorade and Pedialyte but does not like the taste of either.  Review of Systems Pertinent positive and negative review of systems were noted in the above HPI section.  All other review of systems was otherwise negative.  Outpatient Encounter Medications as of 07/26/2020  Medication Sig  . Ascorbic Acid (VITAMIN C) 100 MG tablet Take 100 mg by mouth daily.  Marland Kitchen aspirin EC 81 MG tablet Take 81 mg by mouth daily.  . Biotin 10000 MCG TABS Take 1 tablet by mouth daily.  . Cholecalciferol 50 MCG (2000 UT) CAPS Take 1 capsule (2,000 Units total) by mouth daily.  Marland Kitchen levothyroxine (SYNTHROID) 25 MCG tablet Take 1 tablet (25 mcg total) by mouth daily before breakfast.  . lovastatin (MEVACOR) 20 MG tablet Take 1 tablet (20 mg total) by mouth at bedtime.  . triamterene-hydrochlorothiazide (MAXZIDE-25) 37.5-25 MG tablet Take 1 tablet by mouth daily.  Marland Kitchen zinc gluconate 50 MG tablet Take 50 mg by mouth daily.   No facility-administered encounter medications on file as of 07/26/2020.   Allergies  Allergen Reactions  . Amoxicillin     UNSPECIFIED REACTION   Has patient had a PCN reaction causing immediate rash, facial/tongue/throat swelling, SOB or lightheadedness with hypotension: Unknown Has patient had a PCN reaction causing severe rash involving mucus membranes or skin necrosis: Unknown Has  patient had a PCN reaction that required hospitalization No Has patient had a PCN reaction occurring within the last 10 years: No If all of the above answers are "NO", then may proceed with Cephalosporin use.    Patient Active Problem List   Diagnosis Date Noted  . Chronic venous insufficiency 11/13/2017  . Overweight (BMI 25.0-29.9) 11/13/2017  . Senile osteoporosis 08/28/2016  . Constipation 08/27/2016  . Arthritis of right hip 08/20/2016  . Traumatic arthritis of hip, left 08/16/2016  . Hypokalemia 04/21/2016  . Vitamin D  deficiency 04/21/2016  . Hypothyroidism   . Hyperlipidemia   . Essential hypertension   . PULMONARY NODULE 11/25/2007   Social History   Socioeconomic History  . Marital status: Married    Spouse name: Not on file  . Number of children: Not on file  . Years of education: Not on file  . Highest education level: Not on file  Occupational History  . Not on file  Tobacco Use  . Smoking status: Never Smoker  . Smokeless tobacco: Never Used  Vaping Use  . Vaping Use: Never used  Substance and Sexual Activity  . Alcohol use: No  . Drug use: No  . Sexual activity: Never  Other Topics Concern  . Not on file  Social History Narrative   Tobacco use, amount per day now: NONE   Past tobacco use, amount per day: NONE   How many years did you use tobacco: NONE   Alcohol use (drinks per week): 1   Diet: ONGOING   Do you drink/eat things with caffeine: YES   Marital status: WIDOWED                       What year were you married? 1956   Do you live in a house, apartment, assisted living, condo, trailer, etc.? [place]   Is it one or more stories? ONE   How many persons live in your home? ONE   Do you have pets in your home?( please list) 3 DOGS   Current or past profession: PUBLISHER   Do you exercise?   YES                               Type and how often? SWIM DAILY   Do you have a living will? YES   Do you have a DNR form?   YES                                If not, do you want to discuss one?   Do you have signed POA/HPOA forms?  YES                      If so, please bring to you appointment   Social Determinants of Health   Financial Resource Strain: Not on file  Food Insecurity: Not on file  Transportation Needs: Not on file  Physical Activity: Not on file  Stress: Not on file  Social Connections: Not on file  Intimate Partner Violence: Not on file    Amber Bryant family history includes Cancer in her mother; Colon cancer in her mother; Heart  disease in her father.      Objective:    Vitals:   07/26/20 1045  BP: 94/60  Pulse: 88    Physical Exam  Well-developed well-nourished elderly white female, ambulating with walker, in no acute distress.  Accompanied by friend height, Weight, 154 BMI 24.2  HEENT; nontraumatic normocephalic, EOMI, PE R LA, sclera anicteric. Oropharynx; tongue with whitish  coating Neck; supple, no JVD Cardiovascular; regular rate and rhythm with S1-S2, no murmur rub or gallop Pulmonary; Clear bilaterally Abdomen; soft, she is tender in the left lower quadrant left mid quadrant and suprapubic area no rebound nondistended, no palpable mass or hepatosplenomegaly, bowel sounds are active Rectal; not done today patient unable to get on the exam table Skin; benign exam, no jaundice rash or appreciable lesions Extremities; no clubbing cyanosis or edema skin warm and dry Neuro/Psych; alert and oriented x4, grossly nonfocal mood and affect appropriate       Assessment & Plan:   #46 87 year old white female with 1 month history of acute onset diarrhea abdominal cramping, hematochezia with persistent loose stool 1-2 times daily associated with bright red blood.  Appetite has been decreased she has had some associated nausea and has had about an 11 pound weight loss over the past month due to decrease in oral intake. She has significant abdominal tenderness on exam today left lower quadrant left mid quadrant. Etiology of current illness is not clear, rule out segmental ischemic colitis, doubt infectious colitis due to persistence of symptoms but cannot rule out, rule out colonic neoplasm, early partial obstruction.  Rule out other intra-abdominal inflammatory process.  #2 history of hypertension 3.  Hyperlipidemia 4.  Osteoarthritis 5.  Hypothyroid  Plan; repeat labs today to include CBC with differential, c-Met, sed rate, CRP. We will schedule for CT scan of the abdomen pelvis with contrast later this  week We discussed use of OTC Imodium 1 p.o. each morning. We discussed importance of pushing fluids to get 6 to 8 glasses of liquid in per day, of her choice. Start trial of lactose-free Ensure or lactose-free boost twice daily sip slowly Soft bland diet i.e. toast eggs oatmeal mashed potatoes rice etc. She may require colonoscopy depending on results of CT. Patient will be established with Dr. Silverio Decamp .  Further recommendations pending results of above.  Amber Bryant S Amber Halpin PA-C 07/26/2020   Cc: Gayland Curry, DO

## 2020-07-26 NOTE — Telephone Encounter (Signed)
Pt's daughter and POA Vaughan Basta would like to speak with Amy or her nurse about the details of appt today and the CT scan that pt is scheduled for.

## 2020-07-28 ENCOUNTER — Telehealth: Payer: Self-pay | Admitting: Physician Assistant

## 2020-07-28 DIAGNOSIS — E038 Other specified hypothyroidism: Secondary | ICD-10-CM | POA: Diagnosis not present

## 2020-07-28 DIAGNOSIS — E039 Hypothyroidism, unspecified: Secondary | ICD-10-CM | POA: Diagnosis not present

## 2020-07-28 LAB — TSH: TSH: 2.28 (ref 0.41–5.90)

## 2020-07-28 NOTE — Telephone Encounter (Signed)
Pt is requesting a call back from a nurse, pt states she is having blood in her stools (clots). Pt states it is very painful and would like to know what to do.

## 2020-07-28 NOTE — Telephone Encounter (Addendum)
Spoke with the patient. She tells me this has been occurring since January. She is not certain it is worse than before, but it is very consistent. States,"I try not to go until I really have to go" referring to her bowel movements. States she does pass blood clots. She does feel fatigue. No syncope or near syncope.  She has not started the potassium replacement. She has not taken any Imodium, "I just don't know what that is." She does not want to eat, however she will drink Ensure and eat chicken soup. "Just not hungry." Patient seems to be content with me explaining the care plan, explaining the reasons for the medications and also the explaining the CT imaging that is tomorrow.

## 2020-07-28 NOTE — Telephone Encounter (Signed)
Thanks - Need CT before deciding  next step

## 2020-07-29 ENCOUNTER — Ambulatory Visit (HOSPITAL_COMMUNITY)
Admission: RE | Admit: 2020-07-29 | Discharge: 2020-07-29 | Disposition: A | Discharge status: Home / Self Care | Payer: Medicare Other | Source: Ambulatory Visit | Attending: Physician Assistant | Admitting: Physician Assistant

## 2020-07-29 DIAGNOSIS — N281 Cyst of kidney, acquired: Secondary | ICD-10-CM | POA: Diagnosis not present

## 2020-07-29 DIAGNOSIS — K5730 Diverticulosis of large intestine without perforation or abscess without bleeding: Secondary | ICD-10-CM | POA: Diagnosis not present

## 2020-07-29 DIAGNOSIS — R1030 Lower abdominal pain, unspecified: Secondary | ICD-10-CM | POA: Diagnosis not present

## 2020-07-29 DIAGNOSIS — R197 Diarrhea, unspecified: Secondary | ICD-10-CM | POA: Diagnosis not present

## 2020-07-29 DIAGNOSIS — K625 Hemorrhage of anus and rectum: Secondary | ICD-10-CM | POA: Insufficient documentation

## 2020-07-29 DIAGNOSIS — K8020 Calculus of gallbladder without cholecystitis without obstruction: Secondary | ICD-10-CM | POA: Diagnosis not present

## 2020-07-29 MED ORDER — IOHEXOL 300 MG/ML  SOLN
100.0000 mL | Freq: Once | INTRAMUSCULAR | Status: AC | PRN
  Administered 2020-07-29: 100 mL via INTRAVENOUS

## 2020-08-01 NOTE — Telephone Encounter (Signed)
CT is back. Thanks

## 2020-08-01 NOTE — Telephone Encounter (Signed)
Patients daughter calling to follow up on CT results

## 2020-08-02 ENCOUNTER — Other Ambulatory Visit: Payer: Self-pay

## 2020-08-02 ENCOUNTER — Telehealth: Payer: Self-pay | Admitting: Gastroenterology

## 2020-08-02 MED ORDER — METRONIDAZOLE 500 MG PO TABS: 500.0000 mg | ORAL_TABLET | Freq: Two times a day (BID) | ORAL | 0 refills | Status: AC

## 2020-08-02 MED ORDER — CIPROFLOXACIN HCL 500 MG PO TABS: 500.0000 mg | ORAL_TABLET | Freq: Two times a day (BID) | ORAL | 0 refills | Status: AC

## 2020-08-02 NOTE — Telephone Encounter (Signed)
Received page to on-call from patient's daughter. Chart reviewed and recent evaluation n/f WBC 11, thrombocytosis, elevated ESR (normal CRP), 1 gm decline in Hgb, K 3.3. CT with left sided diverticulosis with possible acute vs chronic diverticulitis. Was prescribed Cipro/Flagyl, but has not yet started.   Call tonight was for questions about dx of diverticulitis and c/f symptomatic hemorrhoids. Describes BRB on tissue paper with intermittent clots. This is overall unchanged from eval in clinic last week, but ongoing. Also with perianal discomfort, but not exquisite pain. She is concerned about how to treat superimposed symptomatic hemorrhoids.   Discussed full DDx for sxs, and while hemorrhoidal etiology possible (to include possible thrombosed hemorrhoids) there are certainly alternate etiologies that may need closer evaluation. Based on age and persistent sxs, along with possible concern for thrombosed hemorrhoid, I recommended ER for expedited eval. Daughter relays that patient overall improved today compared with yesterday, so she'd like to proceed with more conservative plan for now. Will treat for presumed hemorrhoids with topical therapy and plan for call to GI Clinic tomorrow to update on her status. Reiterated that if any change in her status, or if additional concerns, would be reasonable to go to ER for expedited eval. Again, discussed that additional w/u for etiology certainly warranted, but can hopefully accomplish as outpt.   All questions answered and appreciative of call back.   Can we please call to check in on patient tomorrow. Thanks.

## 2020-08-03 NOTE — Telephone Encounter (Signed)
Spoke with the daughter. She said her mother has still not been able to get her medications due to an issue between the pharmacy and her insurance. The nurse at [facility] has intervened and will get it resolved. Patient's temp today is 100.6 and she has a cough per that nurse as well. Hemorrhoid is described by the patient as uncomfortable per daughter. She will call with any further issues.

## 2020-08-03 NOTE — Telephone Encounter (Signed)
I am not sure that this is all diverticulitis - she may have Colitis or ischemic colitis - I need to see her in follow up week after next - if she has any worsening of symptoms she will need to go to ER for admit. She may still need Colonoscopy  as well

## 2020-08-03 NOTE — Telephone Encounter (Signed)
Patient is being moved to the Rehab Unit at [facility]. I have spoken with her new Producer, television/film/video. Discussed the recommendations from [place], PA. Patient has had her first dose of the antibiotics Cipro and Flagyl today at noon. Her follow up appointment is tentatively set for 08/18/20 at 2:00pm. She will call if there are other GI related concerns.

## 2020-08-04 ENCOUNTER — Non-Acute Institutional Stay (SKILLED_NURSING_FACILITY): Payer: Medicare Other | Admitting: Adult Health

## 2020-08-04 ENCOUNTER — Encounter: Payer: Self-pay | Admitting: Adult Health

## 2020-08-04 DIAGNOSIS — L89312 Pressure ulcer of right buttock, stage 2: Secondary | ICD-10-CM

## 2020-08-04 DIAGNOSIS — E876 Hypokalemia: Secondary | ICD-10-CM

## 2020-08-04 DIAGNOSIS — K5792 Diverticulitis of intestine, part unspecified, without perforation or abscess without bleeding: Secondary | ICD-10-CM

## 2020-08-04 DIAGNOSIS — R531 Weakness: Secondary | ICD-10-CM | POA: Diagnosis not present

## 2020-08-04 DIAGNOSIS — I10 Essential (primary) hypertension: Secondary | ICD-10-CM | POA: Diagnosis not present

## 2020-08-04 DIAGNOSIS — Z789 Other specified health status: Secondary | ICD-10-CM

## 2020-08-04 NOTE — Progress Notes (Signed)
Location:  [place] Room Number: 156-A Place of Service:  SNF [phone]) Provider:  Cindi Carbon, NP   Patient Care Team: Gayland Curry, DO as PCP - General (Geriatric Medicine) Sharyne Peach, MD as Consulting Physician (Ophthalmology) Tania Ade, MD as Consulting Physician (Orthopedic Surgery)  Extended Emergency Contact Information Primary Emergency Contact: [place] Phone: [phone] Relation: Daughter  Code Status:  Changed to full code upon admission to [facility].   Goals of care: Advanced Directive information Advanced Directives 08/04/2020  Does Patient Have a Medical Advance Directive? Yes  Type of Paramedic of [place];Living will  Does patient want to make changes to medical advance directive? -  Copy of [place] in Chart? Yes - validated most recent copy scanned in chart (See row information)  Pre-existing out of facility DNR order (yellow form or pink MOST form) -     Chief Complaint  Patient presents with  . Acute Visit    Examine wound     HPI:  Pt is a 87 y.o. female seen today for an acute visit for sacral wound. [place] was [place] [facility] [place] due to weakness, diverticulitis, and for wound care.   She reports diarrhea with blood since Jan 5th. She has had some mild abd pain as well but no vomiting or fever. She was seen by Dr. Mariea Clonts on 1/12 and at that point symptoms had resolved.  CBC and CMP were drawn which were WNL Hgb was 12.5 WBC 7.2 FOBT were negative.   Then the diarrhea with blood returned. A GI referral was placed  And she was seen on 2/8.  Rectal was not done as she could not get up on the exam table. CT of the abd ordered which showed 07/29/20  1. Severe descending and sigmoid diverticulosis with diffuse long segment wall thickening of the sigmoid, consistent with acute and/or chronic diverticulitis in  addition to other nonspecific etiologies of infectious, inflammatory, or ischemic colitis. 2. Cholelithiasis.  Cipro and Flagyl were started on 2/15 for possible diverticulitis  She also has a wound to her buttocks that is painful and red and needs wound care.   She lives at home but is quite debilitated and has home care during the day  She is ambulatory with a walker but weaker  BP 112/67 no dizziness  No abd pain, fever or vomiting. The nurse has noted a small amt of bleeding on the pad on her bed but they think this may be from her wound.   Less diarrhea and gas per pt.  Has lost 12 lbs since this illness  Pt decided to change back to a full code  Also admits anger regarding slow progress with her treatment and feels depressed about her health.     Past Medical History:  Diagnosis Date  . Arthritis   . Closed left femoral fracture ([place]) 2012   s/p ORIF, Dr. Tamera Punt  . Closed left radial fracture 2012   s/p reduction  . Complication of anesthesia 08/14/2016   not able to think clear for a long time afterwards  . Essential hypertension   . Hyperlipidemia   . Hypothyroidism    Past Surgical History:  Procedure Laterality Date  . ABDOMINAL HYSTERECTOMY    . CATARACT EXTRACTION W/ INTRAOCULAR LENS  IMPLANT, BILATERAL Bilateral 2014  . COLONOSCOPY    . ORIF FEMUR FRACTURE Left 04/08/2011   Dr. Tamera Punt  . TOTAL  HIP ARTHROPLASTY Left 08/20/2016   Procedure: TOTAL HIP ARTHROPLASTY POSTERIOR REMOVE [place] NAIL;  Surgeon: Frederik Pear, MD;  Location: [facility];  Service: Orthopedics;  Laterality: Left;    Allergies  Allergen Reactions  . Amoxicillin     UNSPECIFIED REACTION   Has patient had a PCN reaction causing immediate rash, facial/tongue/throat swelling, SOB or lightheadedness with hypotension: Unknown Has patient had a PCN reaction causing severe rash involving mucus membranes or skin necrosis: Unknown Has patient had a PCN reaction that required hospitalization  No Has patient had a PCN reaction occurring within the last 10 years: No If all of the above answers are "NO", then may proceed with Cephalosporin use.     Outpatient Encounter Medications as of 08/04/2020  Medication Sig  . Ascorbic Acid (VITAMIN C) 100 MG tablet Take 100 mg by mouth daily.  Marland Kitchen aspirin EC 81 MG tablet Take 81 mg by mouth daily.  . Biotin 10000 MCG TABS Take 1 tablet by mouth daily.  . Cholecalciferol 50 MCG (2000 UT) CAPS Take 1 capsule (2,000 Units total) by mouth daily.  . ciprofloxacin (CIPRO) 500 MG tablet Take 1 tablet (500 mg total) by mouth 2 (two) times daily for 14 days.  Marland Kitchen levothyroxine (SYNTHROID) 25 MCG tablet Take 1 tablet (25 mcg total) by mouth daily before breakfast.  . lovastatin (MEVACOR) 20 MG tablet Take 1 tablet (20 mg total) by mouth at bedtime.  . metroNIDAZOLE (FLAGYL) 500 MG tablet Take 1 tablet (500 mg total) by mouth 2 (two) times daily for 14 days.  . potassium chloride 20 MEQ/15ML (10%) SOLN Take 20 mEq by mouth 2 (two) times daily. Dilute with 4 oz of juice per resident preference  . triamterene-hydrochlorothiazide (MAXZIDE-25) 37.5-25 MG tablet Take 1 tablet by mouth daily.  Marland Kitchen zinc gluconate 50 MG tablet Take 50 mg by mouth daily.  . [DISCONTINUED] potassium chloride SA (KLOR-CON) 20 MEQ tablet Take 1 tablet (20 mEq total) by mouth 2 (two) times daily.   No facility-administered encounter medications on file as of 08/04/2020.    Review of Systems  Constitutional: Positive for activity change, appetite change and unexpected weight change. Negative for chills, diaphoresis, fatigue and fever.  HENT: Negative for congestion.   Respiratory: Negative for cough, shortness of breath and wheezing.   Cardiovascular: Negative for chest pain, palpitations and leg swelling.  Gastrointestinal: Positive for abdominal pain and blood in stool. Negative for abdominal distention, constipation, diarrhea, nausea, rectal pain and vomiting.  Genitourinary:  Negative for difficulty urinating and dysuria.  Musculoskeletal: Negative for arthralgias, back pain, gait problem (uses walker ), joint swelling and myalgias.  Skin: Positive for wound.  Neurological: Negative for dizziness, tremors, seizures, syncope, facial asymmetry, speech difficulty, weakness (generalized ), light-headedness, numbness and headaches.  Psychiatric/Behavioral: Negative for agitation, behavioral problems and confusion.    Immunization History  Administered Date(s) Administered  . H1N1 05/27/2008  . Influenza, High Dose Seasonal PF 03/23/2016, 04/03/2017, 02/24/2018, 04/15/2020  . Influenza-Unspecified 03/18/2016, 03/19/2019  . Moderna Sars-Covid-2 Vaccination 06/30/2019, 07/28/2019, 05/03/2020   Pertinent  Health Maintenance Due  Topic Date Due  . INFLUENZA VACCINE  Completed  . DEXA SCAN  Completed  . PNA vac Low Risk Adult  Completed   Fall Risk  06/29/2020 04/28/2020 12/30/2019 04/21/2019 01/07/2019  Falls in the past year? 0 0 0 0 0  Number falls in past yr: 0 0 0 - 0  Injury with Fall? 0 0 0 - 0  Risk for fall due to : - - - - -  Follow up - - - - -   Functional Status Survey:    Vitals:   08/04/20 1004  BP: 112/67  Pulse: 72  Resp: 12  Temp: 98.6 F (37 C)  SpO2: 96%  Weight: 162 lb 12.8 oz (73.8 kg)  Height: 5' 6"  (1.676 m)   Body mass index is 26.28 kg/m. Physical Exam Vitals and nursing note reviewed.  Constitutional:      General: She is not in acute distress.    Appearance: She is not diaphoretic.  HENT:     Head: Normocephalic and atraumatic.     Mouth/Throat:     Mouth: Mucous membranes are moist.     Pharynx: Oropharynx is clear.  Neck:     Vascular: No carotid bruit or JVD.  Cardiovascular:     Rate and Rhythm: Normal rate and regular rhythm.     Heart sounds: No murmur heard.   Pulmonary:     Effort: Pulmonary effort is normal. No respiratory distress.     Breath sounds: Normal breath sounds. No wheezing.  Abdominal:      General: Bowel sounds are normal. There is no distension.     Palpations: Abdomen is soft. There is no mass.     Tenderness: There is abdominal tenderness (LLQ mild). There is no right CVA tenderness, left CVA tenderness, guarding or rebound.     Hernia: No hernia is present.  Musculoskeletal:     Cervical back: No rigidity or tenderness.     Right lower leg: No edema.     Left lower leg: No edema.  Lymphadenopathy:     Cervical: No cervical adenopathy.  Skin:    General: Skin is warm and dry.     Comments: Right buttock wound with 75% yellow slough 25% pink tissue, tenderness noted to the peri wound area and surrounding erythema. Sacral wound with 50% pink and 50% slough with tenderness and surrounding erythema noted as well   Neurological:     Mental Status: She is alert and oriented to person, place, and time.  Psychiatric:     Comments: depressed     Labs reviewed: Recent Labs    12/22/19 0800 06/30/20 0000 07/26/20 1150  NA 142 140 135  K 4.1 3.9 3.3*  CL 107 103 96  CO2 25* 24* 28  GLUCOSE  --   --  110*  BUN 22* 18 16  CREATININE 0.8 0.9 0.99  CALCIUM 9.8 9.5 9.5   Recent Labs    06/30/20 0000 07/26/20 1150  AST 17 15  ALT 10 15  ALKPHOS  --  169*  BILITOT  --  0.8  PROT  --  7.4  ALBUMIN  --  3.4*   Recent Labs    12/22/19 0800 06/30/20 0000 07/26/20 1150  WBC 4.5 7.2 11.3*  NEUTROABS  --   --  8.1*  HGB 12.7 12.5 11.8*  HCT 38 37 35.4*  MCV  --   --  91.0  PLT 201 277 559.0*   Lab Results  Component Value Date   TSH 2.28 07/28/2020   Lab Results  Component Value Date   HGBA1C 5.7 (H) 04/10/2011   Lab Results  Component Value Date   CHOL 193 11/18/2018   HDL 69 07/02/2019   LDLCALC 108 07/02/2019   TRIG 112 07/02/2019    Significant Diagnostic Results in last 30 days:  CT Abdomen Pelvis W Contrast  Result Date: 07/31/2020 CLINICAL DATA:  Diarrhea EXAM: CT ABDOMEN AND PELVIS  WITH CONTRAST TECHNIQUE: Multidetector CT imaging of the  abdomen and pelvis was performed using the standard protocol following bolus administration of intravenous contrast. CONTRAST:  115m OMNIPAQUE IOHEXOL 300 MG/ML SOLN, additional oral enteric contrast COMPARISON:  None. FINDINGS: Lower chest: No acute abnormality. Hepatobiliary: No solid liver abnormality is seen. Small gallstones in the gallbladder. No gallbladder wall thickening, or biliary dilatation. Pancreas: Unremarkable. No pancreatic ductal dilatation or surrounding inflammatory changes. Spleen: Normal in size without significant abnormality. Adrenals/Urinary Tract: Adrenal glands are unremarkable. There is a large, exophytic cyst of the inferior pole of the right kidney measuring 10.6 cm. Kidneys are otherwise normal, without renal calculi, solid lesion, or hydronephrosis. Bladder is unremarkable. Stomach/Bowel: Stomach is within normal limits. Appendix is not clearly visualized and may be surgically absent. Severe descending and sigmoid diverticulosis with diffuse long segment wall thickening of the sigmoid (series 2, image 58). Vascular/Lymphatic: Aortic atherosclerosis. No enlarged abdominal or pelvic lymph nodes. Reproductive: Status post hysterectomy. Other: No abdominal wall hernia or abnormality. No abdominopelvic ascites. Musculoskeletal: No acute or significant osseous findings. Status post left hip total arthroplasty. IMPRESSION: 1. Severe descending and sigmoid diverticulosis with diffuse long segment wall thickening of the sigmoid, consistent with acute and/or chronic diverticulitis in addition to other nonspecific etiologies of infectious, inflammatory, or ischemic colitis. 2. Cholelithiasis. Aortic Atherosclerosis (ICD10-I70.0). Electronically Signed   By: AEddie CandleM.D.   On: 07/31/2020 13:53    Assessment/Plan  1. Full code status Discussed with pt, would like to be a full code but will speak with Dr. RMariea Clontsabout this as well given her age/debility   2. Diverticulitis Improvement  noted in symptoms of diarrhea and bleeding. Also had hemorroid which is not present today Continue Cipro and Flagyl 14 day course. Day 3/10 Monitor for signs of ischemia or sepsis  3. Pressure injury of right buttock, stage 2 (HCC) Wound care and dressing changes per [facility] Recommend santyl for slough tissue The wound appears infected with redness and tenderness and likely the Cipro and Flagyl will help with this issue.  Recommend trying prostat for wound healing. Staff to contact the nutritionist to order a supply. Note the pt is lactose intolerant and did not like boost.   4. HTN Controlled Continue Maxzide 37.5/25 1 tab qd   5. Hypokalemia Mild at 3.3 Continue K supplementation (she may have missed a dose)  F/U labs ordered.   6. Weakness Due to acute illness PT and OT ordered.   Family/ staff Communication: discussed with resident.   Labs/tests ordered:  CBC CMP ESR Monday 2/21

## 2020-08-05 ENCOUNTER — Encounter: Payer: Self-pay | Admitting: Adult Health

## 2020-08-08 ENCOUNTER — Encounter: Payer: Self-pay | Admitting: Internal Medicine

## 2020-08-08 DIAGNOSIS — E876 Hypokalemia: Secondary | ICD-10-CM | POA: Diagnosis not present

## 2020-08-08 DIAGNOSIS — M62561 Muscle wasting and atrophy, not elsewhere classified, right lower leg: Secondary | ICD-10-CM | POA: Diagnosis not present

## 2020-08-08 DIAGNOSIS — M62562 Muscle wasting and atrophy, not elsewhere classified, left lower leg: Secondary | ICD-10-CM | POA: Diagnosis not present

## 2020-08-08 DIAGNOSIS — K5792 Diverticulitis of intestine, part unspecified, without perforation or abscess without bleeding: Secondary | ICD-10-CM | POA: Diagnosis not present

## 2020-08-08 DIAGNOSIS — I10 Essential (primary) hypertension: Secondary | ICD-10-CM | POA: Diagnosis not present

## 2020-08-08 DIAGNOSIS — K591 Functional diarrhea: Secondary | ICD-10-CM | POA: Diagnosis not present

## 2020-08-08 DIAGNOSIS — M810 Age-related osteoporosis without current pathological fracture: Secondary | ICD-10-CM | POA: Diagnosis not present

## 2020-08-08 DIAGNOSIS — F3341 Major depressive disorder, recurrent, in partial remission: Secondary | ICD-10-CM | POA: Diagnosis not present

## 2020-08-08 DIAGNOSIS — R278 Other lack of coordination: Secondary | ICD-10-CM | POA: Diagnosis not present

## 2020-08-08 DIAGNOSIS — R2689 Other abnormalities of gait and mobility: Secondary | ICD-10-CM | POA: Diagnosis not present

## 2020-08-08 LAB — BASIC METABOLIC PANEL
BUN: 18 (ref 4–21)
CO2: 25 — AB (ref 13–22)
Chloride: 97 — AB (ref 99–108)
Creatinine: 1.1 (ref 0.5–1.1)
Glucose: 121
Potassium: 4.4 (ref 3.4–5.3)
Sodium: 135 — AB (ref 137–147)

## 2020-08-08 LAB — CBC: RBC: 3.57 — AB (ref 3.87–5.11)

## 2020-08-08 LAB — COMPREHENSIVE METABOLIC PANEL: Calcium: 8.9 (ref 8.7–10.7)

## 2020-08-08 LAB — HEPATIC FUNCTION PANEL
ALT: 9 (ref 7–35)
AST: 14 (ref 13–35)
Alkaline Phosphatase: 152 — AB (ref 25–125)
Bilirubin, Total: 0.3

## 2020-08-08 LAB — POCT ERYTHROCYTE SEDIMENTATION RATE, NON-AUTOMATED: Sed Rate: 85

## 2020-08-08 LAB — CBC AND DIFFERENTIAL
HCT: 32 — AB (ref 36–46)
Hemoglobin: 10.7 — AB (ref 12.0–16.0)
Platelets: 586 — AB (ref 150–399)
WBC: 7.5

## 2020-08-09 ENCOUNTER — Encounter: Payer: Self-pay | Admitting: Internal Medicine

## 2020-08-09 ENCOUNTER — Non-Acute Institutional Stay (SKILLED_NURSING_FACILITY): Payer: Medicare Other | Admitting: Internal Medicine

## 2020-08-09 DIAGNOSIS — F05 Delirium due to known physiological condition: Secondary | ICD-10-CM

## 2020-08-09 DIAGNOSIS — R531 Weakness: Secondary | ICD-10-CM

## 2020-08-09 DIAGNOSIS — R2689 Other abnormalities of gait and mobility: Secondary | ICD-10-CM | POA: Diagnosis not present

## 2020-08-09 DIAGNOSIS — K5792 Diverticulitis of intestine, part unspecified, without perforation or abscess without bleeding: Secondary | ICD-10-CM | POA: Diagnosis not present

## 2020-08-09 DIAGNOSIS — E039 Hypothyroidism, unspecified: Secondary | ICD-10-CM | POA: Diagnosis not present

## 2020-08-09 DIAGNOSIS — R197 Diarrhea, unspecified: Secondary | ICD-10-CM

## 2020-08-09 DIAGNOSIS — I10 Essential (primary) hypertension: Secondary | ICD-10-CM

## 2020-08-09 DIAGNOSIS — M810 Age-related osteoporosis without current pathological fracture: Secondary | ICD-10-CM | POA: Diagnosis not present

## 2020-08-09 DIAGNOSIS — K921 Melena: Secondary | ICD-10-CM | POA: Diagnosis not present

## 2020-08-09 DIAGNOSIS — L89312 Pressure ulcer of right buttock, stage 2: Secondary | ICD-10-CM | POA: Diagnosis not present

## 2020-08-09 DIAGNOSIS — R41841 Cognitive communication deficit: Secondary | ICD-10-CM | POA: Diagnosis not present

## 2020-08-09 DIAGNOSIS — F4329 Adjustment disorder with other symptoms: Secondary | ICD-10-CM | POA: Diagnosis not present

## 2020-08-09 DIAGNOSIS — F3341 Major depressive disorder, recurrent, in partial remission: Secondary | ICD-10-CM | POA: Diagnosis not present

## 2020-08-09 DIAGNOSIS — K591 Functional diarrhea: Secondary | ICD-10-CM | POA: Diagnosis not present

## 2020-08-09 DIAGNOSIS — I872 Venous insufficiency (chronic) (peripheral): Secondary | ICD-10-CM | POA: Diagnosis not present

## 2020-08-09 DIAGNOSIS — M62562 Muscle wasting and atrophy, not elsewhere classified, left lower leg: Secondary | ICD-10-CM | POA: Diagnosis not present

## 2020-08-09 DIAGNOSIS — E876 Hypokalemia: Secondary | ICD-10-CM | POA: Diagnosis not present

## 2020-08-09 DIAGNOSIS — R278 Other lack of coordination: Secondary | ICD-10-CM | POA: Diagnosis not present

## 2020-08-09 DIAGNOSIS — M62561 Muscle wasting and atrophy, not elsewhere classified, right lower leg: Secondary | ICD-10-CM | POA: Diagnosis not present

## 2020-08-09 NOTE — Progress Notes (Signed)
Provider:  Rexene Edison. Mariea Clonts, D.O., C.M.D. Location:  [facility] Room Number: 156-A Place of Service:  SNF (31)  PCP: Gayland Curry, DO Patient Care Team: Gayland Curry, DO as PCP - General (Geriatric Medicine) Sharyne Peach, MD as Consulting Physician (Ophthalmology) Tania Ade, MD as Consulting Physician (Orthopedic Surgery)  Extended Emergency Contact Information Primary Emergency Contact: Britt of [place] Phone: [phone] Relation: Daughter  Code Status: FULL CODE Goals of Care: Advanced Directive information Advanced Directives 08/09/2020  Does Patient Have a Medical Advance Directive? Yes  Type of Paramedic of [place];Living will  Does patient want to make changes to medical advance directive? No - Patient declined  Copy of [place] in Chart? Yes - validated most recent copy scanned in chart (See row information)  Pre-existing out of facility DNR order (yellow form or pink MOST form) -   Chief Complaint  Patient presents with  . New Admit To SNF    New admission to rehab for diverticulitis and cognitive decline     HPI: Patient is a 87 y.o. female seen today for admission to [facility] rehab due to diverticulitis.  She's had problems with nausea from the flagyl and cipro.  Zofran then gave her headache so she's had phenergan with relief instead.  She's had delirium so marinol might worsen this more.  Her cognitive status has declined over the past year and she now has mmse 22/30--failed clock.  WBC normal.  H/h down to 10.7 on 2/21 and ESR 85 as part of GI colitis workup. Diarrhea improved.    Had long discussion with resident's daughter (see phone note)  Past Medical History:  Diagnosis Date  . Arthritis   . Closed left femoral fracture ([place]) 2012   s/p ORIF, Dr. Tamera Punt  . Closed left radial fracture 2012   s/p reduction  . Complication of  anesthesia 08/14/2016   not able to think clear for a long time afterwards  . Essential hypertension   . Hyperlipidemia   . Hypothyroidism    Past Surgical History:  Procedure Laterality Date  . ABDOMINAL HYSTERECTOMY    . CATARACT EXTRACTION W/ INTRAOCULAR LENS  IMPLANT, BILATERAL Bilateral 2014  . COLONOSCOPY    . ORIF FEMUR FRACTURE Left 04/08/2011   Dr. Tamera Punt  . TOTAL HIP ARTHROPLASTY Left 08/20/2016   Procedure: TOTAL HIP ARTHROPLASTY POSTERIOR REMOVE [place] NAIL;  Surgeon: Frederik Pear, MD;  Location: [facility];  Service: Orthopedics;  Laterality: Left;    Social History   Socioeconomic History  . Marital status: Married    Spouse name: Not on file  . Number of children: Not on file  . Years of education: Not on file  . Highest education level: Not on file  Occupational History  . Not on file  Tobacco Use  . Smoking status: Never Smoker  . Smokeless tobacco: Never Used  Vaping Use  . Vaping Use: Never used  Substance and Sexual Activity  . Alcohol use: No  . Drug use: No  . Sexual activity: Never  Other Topics Concern  . Not on file  Social History Narrative   Tobacco use, amount per day now: NONE   Past tobacco use, amount per day: NONE   How many years did you use tobacco: NONE   Alcohol use (drinks per week): 1   Diet: ONGOING   Do you drink/eat things with caffeine: YES   Marital status: WIDOWED  What year were you married? 1956   Do you live in a house, apartment, assisted living, condo, trailer, etc.? [place] ([place])   Is it one or more stories? ONE   How many persons live in your home? ONE   Do you have pets in your home?( please list) 3 DOGS   Current or past profession: PUBLISHER   Do you exercise?   YES                               Type and how often? SWIM DAILY   Do you have a living will? YES   Do you have a DNR form?   YES                                If not, do you want to discuss one?   Do you have signed  POA/HPOA forms?  YES                      If so, please bring to you appointment   Social Determinants of Health   Financial Resource Strain: Not on file  Food Insecurity: Not on file  Transportation Needs: Not on file  Physical Activity: Not on file  Stress: Not on file  Social Connections: Not on file    reports that she has never smoked. She has never used smokeless tobacco. She reports that she does not drink alcohol and does not use drugs.  Functional Status Survey:    Family History  Problem Relation Age of Onset  . Cancer Mother   . Colon cancer Mother   . Heart disease Father   . Esophageal cancer Neg Hx   . Stomach cancer Neg Hx   . Pancreatic cancer Neg Hx     Health Maintenance  Topic Date Due  . TETANUS/TDAP  04/02/2022  . INFLUENZA VACCINE  Completed  . DEXA SCAN  Completed  . COVID-19 Vaccine  Completed  . PNA vac Low Risk Adult  Completed    Allergies  Allergen Reactions  . Amoxicillin     UNSPECIFIED REACTION   Has patient had a PCN reaction causing immediate rash, facial/tongue/throat swelling, SOB or lightheadedness with hypotension: Unknown Has patient had a PCN reaction causing severe rash involving mucus membranes or skin necrosis: Unknown Has patient had a PCN reaction that required hospitalization No Has patient had a PCN reaction occurring within the last 10 years: No If all of the above answers are "NO", then may proceed with Cephalosporin use.     Outpatient Encounter Medications as of 08/09/2020  Medication Sig  . acetaminophen (TYLENOL) 325 MG tablet Take 650 mg by mouth 3 (three) times daily as needed for mild pain.  . Ascorbic Acid (VITAMIN C) 100 MG tablet Take 100 mg by mouth daily.  Marland Kitchen aspirin EC 81 MG tablet Take 81 mg by mouth daily.  . Biotin 10000 MCG TABS Take 1 tablet by mouth daily.  . Cholecalciferol 50 MCG (2000 UT) CAPS Take 1 capsule (2,000 Units total) by mouth daily.  . ciprofloxacin (CIPRO) 500 MG tablet Take 1 tablet  (500 mg total) by mouth 2 (two) times daily for 14 days.  Marland Kitchen levothyroxine (SYNTHROID) 25 MCG tablet Take 1 tablet (25 mcg total) by mouth daily before breakfast.  . loperamide (IMODIUM A-D) 2 MG tablet Take 2 mg  by mouth 3 (three) times daily as needed for diarrhea or loose stools.  . lovastatin (MEVACOR) 20 MG tablet Take 1 tablet (20 mg total) by mouth at bedtime.  . metroNIDAZOLE (FLAGYL) 500 MG tablet Take 1 tablet (500 mg total) by mouth 2 (two) times daily for 14 days.  . ondansetron (ZOFRAN) 4 MG tablet Take 4 mg by mouth every 6 (six) hours as needed for nausea or vomiting.  . potassium chloride 20 MEQ/15ML (10%) SOLN Take 20 mEq by mouth 2 (two) times daily. Dilute with 4 oz of juice per resident preference  . promethazine (PHENERGAN) 25 MG tablet Take 25 mg by mouth every 6 (six) hours as needed for nausea or vomiting (If vomiting persists after 2 doses, notify MD/NP).  Marland Kitchen triamterene-hydrochlorothiazide (MAXZIDE-25) 37.5-25 MG tablet Take 1 tablet by mouth daily.  Marland Kitchen zinc gluconate 50 MG tablet Take 50 mg by mouth daily.   No facility-administered encounter medications on file as of 08/09/2020.    Review of Systems  Constitutional: Positive for malaise/fatigue and weight loss. Negative for chills and fever.  HENT: Positive for hearing loss. Negative for congestion and sore throat.   Eyes: Negative for blurred vision.  Respiratory: Negative for cough and shortness of breath.   Cardiovascular: Positive for leg swelling. Negative for chest pain and palpitations.  Gastrointestinal: Positive for abdominal pain, blood in stool, diarrhea, heartburn and nausea. Negative for constipation, melena and vomiting.  Genitourinary: Negative for dysuria.  Musculoskeletal: Negative for back pain and falls.  Skin:       Pressure injuries to sacrum/buttocks  Neurological: Positive for weakness. Negative for dizziness.  Endo/Heme/Allergies: Bruises/bleeds easily.  Psychiatric/Behavioral: Positive for  depression and memory loss. Negative for suicidal ideas. The patient is nervous/anxious. The patient does not have insomnia.     Vitals:   08/09/20 1114  BP: 107/65  Pulse: 80  Resp: 16  Temp: 97.6 F (36.4 C)  SpO2: 95%  Weight: 149 lb 12.8 oz (67.9 kg)  Height: 5' 6"  (1.676 m)   Body mass index is 24.18 kg/m. Physical Exam Vitals and nursing note reviewed.  Constitutional:      General: She is not in acute distress.    Appearance: She is ill-appearing. She is not toxic-appearing.     Comments: Pale, slightly gray tone  HENT:     Head: Normocephalic and atraumatic.     Right Ear: External ear normal.     Left Ear: External ear normal.     Ears:     Comments: HOH    Nose: Nose normal.     Mouth/Throat:     Pharynx: Oropharynx is clear.  Eyes:     Extraocular Movements: Extraocular movements intact.     Conjunctiva/sclera: Conjunctivae normal.     Pupils: Pupils are equal, round, and reactive to light.  Cardiovascular:     Rate and Rhythm: Normal rate and regular rhythm.     Heart sounds: No murmur heard.   Pulmonary:     Effort: Pulmonary effort is normal.     Breath sounds: Normal breath sounds. No wheezing, rhonchi or rales.  Abdominal:     General: Bowel sounds are normal. There is no distension.     Palpations: Abdomen is soft. There is no mass.     Tenderness: There is abdominal tenderness. There is no right CVA tenderness, left CVA tenderness, guarding or rebound.     Comments: Tenderness of periumbilical area  Musculoskeletal:  General: Normal range of motion.     Cervical back: Neck supple.     Right lower leg: Edema present.     Left lower leg: Edema present.     Comments: Chronic venous edema  Lymphadenopathy:     Cervical: No cervical adenopathy.  Skin:    Comments: Sacral pressure injuries  Neurological:     General: No focal deficit present.     Mental Status: She is alert.     Motor: Weakness present.     Gait: Gait abnormal.      Comments: difficulty staying on topic, repeating same questions about current medical condition (moreso than baseline); using walker short distances but often refusing therapy, meds, etc  Psychiatric:     Comments: Affect flat vs usual also, some thought content not logical or appropriate; lacks insight into cognitive changes     Labs reviewed: Basic Metabolic Panel: Recent Labs    06/30/20 0000 07/26/20 1150 08/08/20 0000  NA 140 135 135*  K 3.9 3.3* 4.4  CL 103 96 97*  CO2 24* 28 25*  GLUCOSE  --  110*  --   BUN 18 16 18   CREATININE 0.9 0.99 1.1  CALCIUM 9.5 9.5 8.9   Liver Function Tests: Recent Labs    06/30/20 0000 07/26/20 1150 08/08/20 0000  AST 17 15 14   ALT 10 15 9   ALKPHOS  --  169* 152*  BILITOT  --  0.8  --   PROT  --  7.4  --   ALBUMIN  --  3.4*  --    No results for input(s): LIPASE, AMYLASE in the last 8760 hours. No results for input(s): AMMONIA in the last 8760 hours. CBC: Recent Labs    06/30/20 0000 07/26/20 1150 08/08/20 0000  WBC 7.2 11.3* 7.5  NEUTROABS  --  8.1*  --   HGB 12.5 11.8* 10.7*  HCT 37 35.4* 32*  MCV  --  91.0  --   PLT 277 559.0* 586*   Cardiac Enzymes: No results for input(s): CKTOTAL, CKMB, CKMBINDEX, TROPONINI in the last 8760 hours. BNP: Invalid input(s): POCBNP Lab Results  Component Value Date   HGBA1C 5.7 (H) 04/10/2011   Lab Results  Component Value Date   TSH 2.28 07/28/2020   No results found for: VITAMINB12 No results found for: FOLATE No results found for: IRON, TIBC, FERRITIN  Imaging and Procedures obtained prior to SNF admission: CT Abdomen Pelvis W Contrast  Result Date: 07/31/2020 CLINICAL DATA:  Diarrhea EXAM: CT ABDOMEN AND PELVIS WITH CONTRAST TECHNIQUE: Multidetector CT imaging of the abdomen and pelvis was performed using the standard protocol following bolus administration of intravenous contrast. CONTRAST:  171m OMNIPAQUE IOHEXOL 300 MG/ML SOLN, additional oral enteric contrast COMPARISON:   None. FINDINGS: Lower chest: No acute abnormality. Hepatobiliary: No solid liver abnormality is seen. Small gallstones in the gallbladder. No gallbladder wall thickening, or biliary dilatation. Pancreas: Unremarkable. No pancreatic ductal dilatation or surrounding inflammatory changes. Spleen: Normal in size without significant abnormality. Adrenals/Urinary Tract: Adrenal glands are unremarkable. There is a large, exophytic cyst of the inferior pole of the right kidney measuring 10.6 cm. Kidneys are otherwise normal, without renal calculi, solid lesion, or hydronephrosis. Bladder is unremarkable. Stomach/Bowel: Stomach is within normal limits. Appendix is not clearly visualized and may be surgically absent. Severe descending and sigmoid diverticulosis with diffuse long segment wall thickening of the sigmoid (series 2, image 58). Vascular/Lymphatic: Aortic atherosclerosis. No enlarged abdominal or pelvic lymph nodes. Reproductive: Status post  hysterectomy. Other: No abdominal wall hernia or abnormality. No abdominopelvic ascites. Musculoskeletal: No acute or significant osseous findings. Status post left hip total arthroplasty. IMPRESSION: 1. Severe descending and sigmoid diverticulosis with diffuse long segment wall thickening of the sigmoid, consistent with acute and/or chronic diverticulitis in addition to other nonspecific etiologies of infectious, inflammatory, or ischemic colitis. 2. Cholelithiasis. Aortic Atherosclerosis (ICD10-I70.0). Electronically Signed   By: Eddie Candle M.D.   On: 07/31/2020 13:53    Assessment/Plan 1. Diverticulitis -to complete course of abx--cipro 53m po bid and flagyl 5011mpo bid for 14 days (completes 3/1) -if symptoms did not resolve, colonoscopy recommended by GI -counseled that nausea likely side effect of meds plus her infection--encouraged her to eat -using phenergan for nausea now as zofran gave her headache  2. Pressure injury of right buttock, stage 2  (HCMeridian"Wound #1 on coccyx measured 2cmx3cm x.1cm. Wound irregular oval shape. Wd bed clean and pink with small amount white slough. Wound #2 in sacral fold measures 3cmx 2cm x.2cm wound bed approx. 75% covered in white slough tissue 25% pink and clean. Wounds irrigated with normal saline, pat dry. Skin prep applied to margins of wound. Gauze 2x2 impregnated with santyl lightly packed into wd bed and damp saline moistened 2x2 gauze applied over dsg and covered with dsd 2x2 and Tegaderm to affix dsg to skin." -cont wound care as above -resident did not tolerate air mattress so alternative mattress provided -resident was encouraged to accept positional changes to offload pressure -cont boost breeze and prostat supplements and encouraged to eat high protein diet  3. Primary hypertension -bp today on low end -encouraged hydration, cont same regimen at this time  4. Weakness -here for PT, OT -encouraged regular participation to get stronger and get through this  5. Blood in stool, frank -has had further workup for colitis and does have elevated ESR -has GI f/u -has not had recent blood in stool in rehab but had been having for several weeks off and on without telling clinic nurse or myself (after visit in Jan when thought to be resolved)  6. Diarrhea, unspecified type -colitis workup in progress also  -f/u with GI -complete tx for diverticultis  7. Chronic venous insufficiency -elevate feet at rest -should be wearing compression hose in day and off at night -she's resistant to this at times, as well  8. Hypothyroidism, unspecified type -cont levothyroxine, TSH was therapeutic  9. Delirium superimposed on dementia -cognitive status has declined considerably amid this infection and GI bleeding episode -she's had some paranoid delusions with this, as well -hoping she'll get back some of her cognition when she recovers from the infection  Family/ staff Communication: d/w snf nurse, later  spoke with resident's daughter also  Labs/tests ordered:   Lab Orders     CBC and differential     CBC     Basic metabolic panel     Comprehensive metabolic panel     Hepatic function panel     POCT erythrocyte sed rate, Non-automated Keep GI f/u  Kaesha Kirsch L. Emalee Knies, D.O. [facility] [address] [address] Phone (Mon-Fri 8am-5pm):  [phone] Call:  [phone] follow prompts after 5pm & weekends Office Phone:  [phone] Fax:  [phone]

## 2020-08-10 DIAGNOSIS — F3341 Major depressive disorder, recurrent, in partial remission: Secondary | ICD-10-CM | POA: Diagnosis not present

## 2020-08-10 DIAGNOSIS — R41841 Cognitive communication deficit: Secondary | ICD-10-CM | POA: Diagnosis not present

## 2020-08-10 DIAGNOSIS — K591 Functional diarrhea: Secondary | ICD-10-CM | POA: Diagnosis not present

## 2020-08-10 DIAGNOSIS — E876 Hypokalemia: Secondary | ICD-10-CM | POA: Diagnosis not present

## 2020-08-10 DIAGNOSIS — F4329 Adjustment disorder with other symptoms: Secondary | ICD-10-CM | POA: Diagnosis not present

## 2020-08-10 DIAGNOSIS — M6389 Disorders of muscle in diseases classified elsewhere, multiple sites: Secondary | ICD-10-CM | POA: Diagnosis not present

## 2020-08-10 DIAGNOSIS — R278 Other lack of coordination: Secondary | ICD-10-CM | POA: Diagnosis not present

## 2020-08-10 DIAGNOSIS — I10 Essential (primary) hypertension: Secondary | ICD-10-CM | POA: Diagnosis not present

## 2020-08-10 DIAGNOSIS — M810 Age-related osteoporosis without current pathological fracture: Secondary | ICD-10-CM | POA: Diagnosis not present

## 2020-08-11 DIAGNOSIS — K591 Functional diarrhea: Secondary | ICD-10-CM | POA: Diagnosis not present

## 2020-08-11 DIAGNOSIS — F3341 Major depressive disorder, recurrent, in partial remission: Secondary | ICD-10-CM | POA: Diagnosis not present

## 2020-08-11 DIAGNOSIS — F4329 Adjustment disorder with other symptoms: Secondary | ICD-10-CM | POA: Diagnosis not present

## 2020-08-11 DIAGNOSIS — R41841 Cognitive communication deficit: Secondary | ICD-10-CM | POA: Diagnosis not present

## 2020-08-11 DIAGNOSIS — M62561 Muscle wasting and atrophy, not elsewhere classified, right lower leg: Secondary | ICD-10-CM | POA: Diagnosis not present

## 2020-08-11 DIAGNOSIS — M62562 Muscle wasting and atrophy, not elsewhere classified, left lower leg: Secondary | ICD-10-CM | POA: Diagnosis not present

## 2020-08-11 DIAGNOSIS — R278 Other lack of coordination: Secondary | ICD-10-CM | POA: Diagnosis not present

## 2020-08-11 DIAGNOSIS — I10 Essential (primary) hypertension: Secondary | ICD-10-CM | POA: Diagnosis not present

## 2020-08-11 DIAGNOSIS — M6389 Disorders of muscle in diseases classified elsewhere, multiple sites: Secondary | ICD-10-CM | POA: Diagnosis not present

## 2020-08-11 DIAGNOSIS — E876 Hypokalemia: Secondary | ICD-10-CM | POA: Diagnosis not present

## 2020-08-11 DIAGNOSIS — M810 Age-related osteoporosis without current pathological fracture: Secondary | ICD-10-CM | POA: Diagnosis not present

## 2020-08-11 DIAGNOSIS — R2689 Other abnormalities of gait and mobility: Secondary | ICD-10-CM | POA: Diagnosis not present

## 2020-08-12 ENCOUNTER — Telehealth: Payer: Self-pay | Admitting: Internal Medicine

## 2020-08-12 DIAGNOSIS — F3341 Major depressive disorder, recurrent, in partial remission: Secondary | ICD-10-CM | POA: Diagnosis not present

## 2020-08-12 DIAGNOSIS — M6389 Disorders of muscle in diseases classified elsewhere, multiple sites: Secondary | ICD-10-CM | POA: Diagnosis not present

## 2020-08-12 DIAGNOSIS — K591 Functional diarrhea: Secondary | ICD-10-CM | POA: Diagnosis not present

## 2020-08-12 DIAGNOSIS — M810 Age-related osteoporosis without current pathological fracture: Secondary | ICD-10-CM | POA: Diagnosis not present

## 2020-08-12 DIAGNOSIS — R278 Other lack of coordination: Secondary | ICD-10-CM | POA: Diagnosis not present

## 2020-08-12 DIAGNOSIS — E876 Hypokalemia: Secondary | ICD-10-CM | POA: Diagnosis not present

## 2020-08-12 DIAGNOSIS — I10 Essential (primary) hypertension: Secondary | ICD-10-CM | POA: Diagnosis not present

## 2020-08-12 NOTE — Telephone Encounter (Signed)
Spoke with Vaughan Basta re: her mother and visit I had with her on Wednesday.   She shared that WS has a care plan meeting set up for Monday.  Her mother is a very private person and downplays her symptoms a lot.  Per Levy Pupa' symptoms of GI bleeding began as far back as 1/5 when she had blood in her stools when she went to the restroom while Vaughan Basta was visiting.  She had been resistant to seeking help but finally did call the clinic nurse.  I had seen pt 1/12 when she'd said her bleeding had stopped and there was none present with hemoccult and no visible external hemorrhoids or wounds.  When the bleeding returned, she was referred to GI and nurse manager was able to get appt arranged for 2/8.  I provided an update about labs, resident's condition wed and info nursing has shared.  We discussed that if she does make a full recovery as far as her wounds and diverticulitis, that she likely will require AL level of care due to her cognitive status/dementia.  She scored 22/30 on her mmse.  We also discussed role of delirium on dementia.    17 mins spent on call.

## 2020-08-13 DIAGNOSIS — F3341 Major depressive disorder, recurrent, in partial remission: Secondary | ICD-10-CM | POA: Diagnosis not present

## 2020-08-13 DIAGNOSIS — E876 Hypokalemia: Secondary | ICD-10-CM | POA: Diagnosis not present

## 2020-08-13 DIAGNOSIS — K591 Functional diarrhea: Secondary | ICD-10-CM | POA: Diagnosis not present

## 2020-08-13 DIAGNOSIS — M810 Age-related osteoporosis without current pathological fracture: Secondary | ICD-10-CM | POA: Diagnosis not present

## 2020-08-13 DIAGNOSIS — M62562 Muscle wasting and atrophy, not elsewhere classified, left lower leg: Secondary | ICD-10-CM | POA: Diagnosis not present

## 2020-08-13 DIAGNOSIS — R2689 Other abnormalities of gait and mobility: Secondary | ICD-10-CM | POA: Diagnosis not present

## 2020-08-13 DIAGNOSIS — R278 Other lack of coordination: Secondary | ICD-10-CM | POA: Diagnosis not present

## 2020-08-13 DIAGNOSIS — I10 Essential (primary) hypertension: Secondary | ICD-10-CM | POA: Diagnosis not present

## 2020-08-13 DIAGNOSIS — M62561 Muscle wasting and atrophy, not elsewhere classified, right lower leg: Secondary | ICD-10-CM | POA: Diagnosis not present

## 2020-08-14 DIAGNOSIS — E876 Hypokalemia: Secondary | ICD-10-CM | POA: Diagnosis not present

## 2020-08-14 DIAGNOSIS — K591 Functional diarrhea: Secondary | ICD-10-CM | POA: Diagnosis not present

## 2020-08-14 DIAGNOSIS — R278 Other lack of coordination: Secondary | ICD-10-CM | POA: Diagnosis not present

## 2020-08-14 DIAGNOSIS — M810 Age-related osteoporosis without current pathological fracture: Secondary | ICD-10-CM | POA: Diagnosis not present

## 2020-08-14 DIAGNOSIS — R2689 Other abnormalities of gait and mobility: Secondary | ICD-10-CM | POA: Diagnosis not present

## 2020-08-14 DIAGNOSIS — M62562 Muscle wasting and atrophy, not elsewhere classified, left lower leg: Secondary | ICD-10-CM | POA: Diagnosis not present

## 2020-08-14 DIAGNOSIS — F3341 Major depressive disorder, recurrent, in partial remission: Secondary | ICD-10-CM | POA: Diagnosis not present

## 2020-08-14 DIAGNOSIS — M62561 Muscle wasting and atrophy, not elsewhere classified, right lower leg: Secondary | ICD-10-CM | POA: Diagnosis not present

## 2020-08-14 DIAGNOSIS — I10 Essential (primary) hypertension: Secondary | ICD-10-CM | POA: Diagnosis not present

## 2020-08-15 DIAGNOSIS — E876 Hypokalemia: Secondary | ICD-10-CM | POA: Diagnosis not present

## 2020-08-15 DIAGNOSIS — I10 Essential (primary) hypertension: Secondary | ICD-10-CM | POA: Diagnosis not present

## 2020-08-15 DIAGNOSIS — M810 Age-related osteoporosis without current pathological fracture: Secondary | ICD-10-CM | POA: Diagnosis not present

## 2020-08-15 DIAGNOSIS — M62561 Muscle wasting and atrophy, not elsewhere classified, right lower leg: Secondary | ICD-10-CM | POA: Diagnosis not present

## 2020-08-15 DIAGNOSIS — R2689 Other abnormalities of gait and mobility: Secondary | ICD-10-CM | POA: Diagnosis not present

## 2020-08-15 DIAGNOSIS — M62562 Muscle wasting and atrophy, not elsewhere classified, left lower leg: Secondary | ICD-10-CM | POA: Diagnosis not present

## 2020-08-15 DIAGNOSIS — K591 Functional diarrhea: Secondary | ICD-10-CM | POA: Diagnosis not present

## 2020-08-15 DIAGNOSIS — M6389 Disorders of muscle in diseases classified elsewhere, multiple sites: Secondary | ICD-10-CM | POA: Diagnosis not present

## 2020-08-15 DIAGNOSIS — R278 Other lack of coordination: Secondary | ICD-10-CM | POA: Diagnosis not present

## 2020-08-15 DIAGNOSIS — F3341 Major depressive disorder, recurrent, in partial remission: Secondary | ICD-10-CM | POA: Diagnosis not present

## 2020-08-15 NOTE — Progress Notes (Signed)
Reviewed and agree with documentation and assessment and plan. K. Veena Nandigam , MD   

## 2020-08-16 DIAGNOSIS — M6389 Disorders of muscle in diseases classified elsewhere, multiple sites: Secondary | ICD-10-CM | POA: Diagnosis not present

## 2020-08-16 DIAGNOSIS — K591 Functional diarrhea: Secondary | ICD-10-CM | POA: Diagnosis not present

## 2020-08-16 DIAGNOSIS — I10 Essential (primary) hypertension: Secondary | ICD-10-CM | POA: Diagnosis not present

## 2020-08-16 DIAGNOSIS — F4329 Adjustment disorder with other symptoms: Secondary | ICD-10-CM | POA: Diagnosis not present

## 2020-08-16 DIAGNOSIS — R278 Other lack of coordination: Secondary | ICD-10-CM | POA: Diagnosis not present

## 2020-08-16 DIAGNOSIS — M62562 Muscle wasting and atrophy, not elsewhere classified, left lower leg: Secondary | ICD-10-CM | POA: Diagnosis not present

## 2020-08-16 DIAGNOSIS — R41841 Cognitive communication deficit: Secondary | ICD-10-CM | POA: Diagnosis not present

## 2020-08-16 DIAGNOSIS — M62561 Muscle wasting and atrophy, not elsewhere classified, right lower leg: Secondary | ICD-10-CM | POA: Diagnosis not present

## 2020-08-16 DIAGNOSIS — E876 Hypokalemia: Secondary | ICD-10-CM | POA: Diagnosis not present

## 2020-08-16 DIAGNOSIS — F3341 Major depressive disorder, recurrent, in partial remission: Secondary | ICD-10-CM | POA: Diagnosis not present

## 2020-08-16 DIAGNOSIS — M810 Age-related osteoporosis without current pathological fracture: Secondary | ICD-10-CM | POA: Diagnosis not present

## 2020-08-16 DIAGNOSIS — R2689 Other abnormalities of gait and mobility: Secondary | ICD-10-CM | POA: Diagnosis not present

## 2020-08-17 ENCOUNTER — Encounter: Payer: Self-pay | Admitting: *Deleted

## 2020-08-17 DIAGNOSIS — M810 Age-related osteoporosis without current pathological fracture: Secondary | ICD-10-CM | POA: Diagnosis not present

## 2020-08-17 DIAGNOSIS — M6389 Disorders of muscle in diseases classified elsewhere, multiple sites: Secondary | ICD-10-CM | POA: Diagnosis not present

## 2020-08-17 DIAGNOSIS — I10 Essential (primary) hypertension: Secondary | ICD-10-CM | POA: Diagnosis not present

## 2020-08-17 DIAGNOSIS — E876 Hypokalemia: Secondary | ICD-10-CM | POA: Diagnosis not present

## 2020-08-17 DIAGNOSIS — F3341 Major depressive disorder, recurrent, in partial remission: Secondary | ICD-10-CM | POA: Diagnosis not present

## 2020-08-17 DIAGNOSIS — R278 Other lack of coordination: Secondary | ICD-10-CM | POA: Diagnosis not present

## 2020-08-17 DIAGNOSIS — K591 Functional diarrhea: Secondary | ICD-10-CM | POA: Diagnosis not present

## 2020-08-17 LAB — BASIC METABOLIC PANEL
BUN: 16 (ref 4–21)
CO2: 27 — AB (ref 13–22)
Chloride: 99 (ref 99–108)
Creatinine: 0.8 (ref 0.5–1.1)
Glucose: 92
Potassium: 3.9 (ref 3.4–5.3)
Sodium: 138 (ref 137–147)

## 2020-08-17 LAB — COMPREHENSIVE METABOLIC PANEL: Calcium: 9.1 (ref 8.7–10.7)

## 2020-08-18 ENCOUNTER — Encounter: Payer: Self-pay | Admitting: Physician Assistant

## 2020-08-18 ENCOUNTER — Ambulatory Visit (INDEPENDENT_AMBULATORY_CARE_PROVIDER_SITE_OTHER): Payer: Medicare Other | Admitting: Physician Assistant

## 2020-08-18 ENCOUNTER — Other Ambulatory Visit: Payer: Self-pay

## 2020-08-18 VITALS — BP 98/64 | HR 80 | Ht 66.0 in | Wt 154.0 lb

## 2020-08-18 DIAGNOSIS — R2689 Other abnormalities of gait and mobility: Secondary | ICD-10-CM | POA: Diagnosis not present

## 2020-08-18 DIAGNOSIS — I10 Essential (primary) hypertension: Secondary | ICD-10-CM | POA: Diagnosis not present

## 2020-08-18 DIAGNOSIS — K5792 Diverticulitis of intestine, part unspecified, without perforation or abscess without bleeding: Secondary | ICD-10-CM

## 2020-08-18 DIAGNOSIS — K591 Functional diarrhea: Secondary | ICD-10-CM | POA: Diagnosis not present

## 2020-08-18 DIAGNOSIS — E876 Hypokalemia: Secondary | ICD-10-CM | POA: Diagnosis not present

## 2020-08-18 DIAGNOSIS — M62562 Muscle wasting and atrophy, not elsewhere classified, left lower leg: Secondary | ICD-10-CM | POA: Diagnosis not present

## 2020-08-18 DIAGNOSIS — F3341 Major depressive disorder, recurrent, in partial remission: Secondary | ICD-10-CM | POA: Diagnosis not present

## 2020-08-18 DIAGNOSIS — M62561 Muscle wasting and atrophy, not elsewhere classified, right lower leg: Secondary | ICD-10-CM | POA: Diagnosis not present

## 2020-08-18 DIAGNOSIS — K50119 Crohn's disease of large intestine with unspecified complications: Secondary | ICD-10-CM

## 2020-08-18 DIAGNOSIS — M810 Age-related osteoporosis without current pathological fracture: Secondary | ICD-10-CM | POA: Diagnosis not present

## 2020-08-18 DIAGNOSIS — R278 Other lack of coordination: Secondary | ICD-10-CM | POA: Diagnosis not present

## 2020-08-18 NOTE — Progress Notes (Signed)
Subjective:    Patient ID: Amber Bryant, female    DOB: 1928/11/12, 87 y.o.   MRN: [id number]  HPI Amber Bryant is a 87 year old white female, who was new to [place] at the time of her last office visit on 07/26/2020 and referred by Hollace Kinnier, DO/[facility] for evaluation of complaints of diarrhea, rectal bleeding and weight loss.  She was seen by myself and is now established with Dr. Silverio Decamp.  Her symptoms had started on January 5 rather acutely with abdominal cramping which occurred in the middle of the night then diarrhea and started noticing red blood with her bowel movements.  Symptoms had persisted over the following 3 weeks and she had lost about 11 pounds.  She complained of not having any appetite, and generalized weakness.  At that time she was having 1-2 loose stools per day. She underwent CT of the abdomen and pelvis with contrast which showed a large exophytic cyst in the inferior right kidney measuring 10.6 cm, there was severe descending and sigmoid colon diverticulosis with diffuse long segment of wall thickening of the sigmoid colon, patient status post hysterectomy.  Findings felt consistent with acute or chronic diverticulitis versus colitis or ischemia. Labs on 07/27/2019 with WBC 11.3, hemoglobin 11.8, potassium 3.3, creatinine 0.99, albumin 3.4 and alk phos 169.  Patient was started on a 14-day course of Cipro and Flagyl. Repeat labs on 08/08/2020-WBC 7.5, hemoglobin 10.7/hematocrit of 32.  Patient says that she had a lot of difficulty taking the antibiotics and that they made her feel very sick to her stomach but she was able to complete the entire course.  She says that she actually feels significantly better than she did when she was here last.  She is not currently having any issues with nausea or vomiting, she feels that she is starting to eat better and is actually getting some appetite.  She still has not been eating very much at a time but does seem to have more interest in food.   She asked today whether it is okay if she has some junk food.  She was able to eat soup for breakfast this morning and had some solid food at lunchtime.  She has not had any fever or chills.  Diarrhea has resolved and she is having formed stools.  She is no longer seeing blood on any sort of regular basis, and does not have any complaints of abdominal pain. She is currently living in the assisted living facility at [facility] and says she does feel more comfortable there with care.  She is not sure whether she will stay there permanently or not Weight has been stable since her last office visit.   Review of Systems Pertinent positive and negative review of systems were noted in the above HPI section.  All other review of systems was otherwise negative.  Outpatient Encounter Medications as of 08/18/2020  Medication Sig  . acetaminophen (TYLENOL) 325 MG tablet Take 650 mg by mouth 3 (three) times daily as needed for mild pain.  . Ascorbic Acid (VITAMIN C) 100 MG tablet Take 100 mg by mouth daily.  Marland Kitchen aspirin EC 81 MG tablet Take 81 mg by mouth daily.  . Biotin 10000 MCG TABS Take 1 tablet by mouth daily.  . Cholecalciferol 50 MCG (2000 UT) CAPS Take 1 capsule (2,000 Units total) by mouth daily.  Marland Kitchen levothyroxine (SYNTHROID) 25 MCG tablet Take 1 tablet (25 mcg total) by mouth daily before breakfast.  . loperamide (IMODIUM A-D) 2 MG  tablet Take 2 mg by mouth 3 (three) times daily as needed for diarrhea or loose stools.  . lovastatin (MEVACOR) 20 MG tablet Take 1 tablet (20 mg total) by mouth at bedtime.  . ondansetron (ZOFRAN) 4 MG tablet Take 4 mg by mouth every 6 (six) hours as needed for nausea or vomiting.  . potassium chloride 20 MEQ/15ML (10%) SOLN Take 20 mEq by mouth 2 (two) times daily. Dilute with 4 oz of juice per resident preference  . promethazine (PHENERGAN) 25 MG tablet Take 25 mg by mouth every 6 (six) hours as needed for nausea or vomiting (If vomiting persists after 2 doses, notify  MD/NP).  Marland Kitchen triamterene-hydrochlorothiazide (MAXZIDE-25) 37.5-25 MG tablet Take 1 tablet by mouth daily.  Marland Kitchen zinc gluconate 50 MG tablet Take 50 mg by mouth daily.   No facility-administered encounter medications on file as of 08/18/2020.   Allergies  Allergen Reactions  . Amoxicillin     UNSPECIFIED REACTION   Has patient had a PCN reaction causing immediate rash, facial/tongue/throat swelling, SOB or lightheadedness with hypotension: Unknown Has patient had a PCN reaction causing severe rash involving mucus membranes or skin necrosis: Unknown Has patient had a PCN reaction that required hospitalization No Has patient had a PCN reaction occurring within the last 10 years: No If all of the above answers are "NO", then may proceed with Cephalosporin use.    Patient Active Problem List   Diagnosis Date Noted  . Chronic venous insufficiency 11/13/2017  . Overweight (BMI 25.0-29.9) 11/13/2017  . Senile osteoporosis 08/28/2016  . Constipation 08/27/2016  . Arthritis of right hip 08/20/2016  . Traumatic arthritis of hip, left 08/16/2016  . Hypokalemia 04/21/2016  . Vitamin D deficiency 04/21/2016  . Hypothyroidism   . Hyperlipidemia   . Primary hypertension   . PULMONARY NODULE 11/25/2007   Social History   Socioeconomic History  . Marital status: Married    Spouse name: Not on file  . Number of children: Not on file  . Years of education: Not on file  . Highest education level: Not on file  Occupational History  . Not on file  Tobacco Use  . Smoking status: Never Smoker  . Smokeless tobacco: Never Used  Vaping Use  . Vaping Use: Never used  Substance and Sexual Activity  . Alcohol use: No  . Drug use: No  . Sexual activity: Never  Other Topics Concern  . Not on file  Social History Narrative   Tobacco use, amount per day now: NONE   Past tobacco use, amount per day: NONE   How many years did you use tobacco: NONE   Alcohol use (drinks per week): 1   Diet: ONGOING    Do you drink/eat things with caffeine: YES   Marital status: WIDOWED                       What year were you married? 1956   Do you live in a house, apartment, assisted living, condo, trailer, etc.? [place] ([place])   Is it one or more stories? ONE   How many persons live in your home? ONE   Do you have pets in your home?( please list) 3 DOGS   Current or past profession: PUBLISHER   Do you exercise?   YES  Type and how often? SWIM DAILY   Do you have a living will? YES   Do you have a DNR form?   YES                                If not, do you want to discuss one?   Do you have signed POA/HPOA forms?  YES                      If so, please bring to you appointment   Social Determinants of Health   Financial Resource Strain: Not on file  Food Insecurity: Not on file  Transportation Needs: Not on file  Physical Activity: Not on file  Stress: Not on file  Social Connections: Not on file  Intimate Partner Violence: Not on file    Amber Bryant family history includes Cancer in her mother; Colon cancer in her mother; Heart disease in her father.      Objective:    Vitals:   08/18/20 1359  BP: 98/64  Pulse: 80    Physical Exam Well-developed very elderly white female in no acute distress.  Patient ambulated with a walker, accompanied by staff member from [place]  height, Weight, 154 BMI 24.8  HEENT; nontraumatic normocephalic, EOMI, PE R LA, sclera anicteric. Oropharynx; not examined today Neck; supple, no JVD Cardiovascular; regular rate and rhythm with S1-S2, no murmur rub or gallop Pulmonary; Clear bilaterally Abdomen; soft, , nondistended, she does still has some mild tenderness in the left mid and left lower quadrant no guarding or rebound, no palpable mass or hepatosplenomegaly, bowel sounds are active Rectal; not done today Skin; benign exam, no jaundice rash or appreciable lesions Extremities; no clubbing cyanosis or  edema skin warm and dry Neuro/Psych; alert and oriented x4, grossly nonfocal mood and affect appropriate, some difficulty with word finding       Assessment & Plan:   #10 87 year old female with recent prolonged illness over the past 6 weeks initially with crampy abdominal pain, diarrhea, rectal bleeding and significant decrease in appetite and associated weakness. CT scan on 07/29/2020 showed a long segment of severe descending and sigmoid diverticulosis with diffuse wall thickening in the sigmoid colon consistent with an acute versus chronic diverticulitis versus underlying colitis versus possible segmental ischemic colitis.  Patient has significantly improved after a course of Cipro and Flagyl x2 weeks. She is still tender on exam though much less so than at last office visit.  Stools are now formed and she is not seeing any blood on a regular basis.  Is not clear to me that this was all just diverticulitis, suspect she may have had a segmental ischemic colitis, or may have had segmental colitis associated with diverticulitis.  Nevertheless she has improved.  Patient is a poor colonoscopy candidate for definitive diagnosis  #2 mild normocytic anemia secondary to above #3 history of hypertension, current blood pressures on the lower side, consider decreasing oral antihypertensive #4 osteoarthritis #5 mild dementia  Plan; Patient is encouraged to continue efforts to increase oral intake and to continue protein supplement twice daily , I discussed potential Colonoscopy and/or repeat imaging.  She is not interested in either of these at present. Could consider trial of Lialda for an underlying segmental colitis if she continues to have pain no and/or intermittent bleeding.  She expresses desire to take as few medications as possible. We will plan to see  her back in the office in about 2 weeks to reassess.  Hopefully she will gradually continue to improve.  She is instructed to call should she  have any recurrence of symptoms in the interim.   Sircharles Holzheimer S Makisha Marrin PA-C 08/18/2020   Cc: Gayland Curry, DO

## 2020-08-18 NOTE — Patient Instructions (Addendum)
If you are age 85 or older, your body mass index should be between 23-30. Your Body mass index is 24.86 kg/m. If this is out of the aforementioned range listed, please consider follow up with your Primary Care Provider.  If you are age 10 or younger, your body mass index should be between 19-25. Your Body mass index is 24.86 kg/m. If this is out of the aformentioned range listed, please consider follow up with your Primary Care Provider.   No changes at this time.  You have been scheduled to follow up with Nicoletta Ba, PA-C on September 01, 2020 at 2:00 pm  Thank you for entrusting me with your care and choosing [facility].  Amy Esterwood, PA-C

## 2020-08-19 DIAGNOSIS — M6389 Disorders of muscle in diseases classified elsewhere, multiple sites: Secondary | ICD-10-CM | POA: Diagnosis not present

## 2020-08-19 DIAGNOSIS — E876 Hypokalemia: Secondary | ICD-10-CM | POA: Diagnosis not present

## 2020-08-19 DIAGNOSIS — K591 Functional diarrhea: Secondary | ICD-10-CM | POA: Diagnosis not present

## 2020-08-19 DIAGNOSIS — I10 Essential (primary) hypertension: Secondary | ICD-10-CM | POA: Diagnosis not present

## 2020-08-19 DIAGNOSIS — R278 Other lack of coordination: Secondary | ICD-10-CM | POA: Diagnosis not present

## 2020-08-19 DIAGNOSIS — M810 Age-related osteoporosis without current pathological fracture: Secondary | ICD-10-CM | POA: Diagnosis not present

## 2020-08-19 DIAGNOSIS — F3341 Major depressive disorder, recurrent, in partial remission: Secondary | ICD-10-CM | POA: Diagnosis not present

## 2020-08-19 DIAGNOSIS — F05 Delirium due to known physiological condition: Secondary | ICD-10-CM | POA: Insufficient documentation

## 2020-08-19 DIAGNOSIS — R41841 Cognitive communication deficit: Secondary | ICD-10-CM | POA: Diagnosis not present

## 2020-08-19 DIAGNOSIS — K5792 Diverticulitis of intestine, part unspecified, without perforation or abscess without bleeding: Secondary | ICD-10-CM | POA: Insufficient documentation

## 2020-08-19 DIAGNOSIS — F4329 Adjustment disorder with other symptoms: Secondary | ICD-10-CM | POA: Diagnosis not present

## 2020-08-21 DIAGNOSIS — M810 Age-related osteoporosis without current pathological fracture: Secondary | ICD-10-CM | POA: Diagnosis not present

## 2020-08-21 DIAGNOSIS — R2689 Other abnormalities of gait and mobility: Secondary | ICD-10-CM | POA: Diagnosis not present

## 2020-08-21 DIAGNOSIS — M62562 Muscle wasting and atrophy, not elsewhere classified, left lower leg: Secondary | ICD-10-CM | POA: Diagnosis not present

## 2020-08-21 DIAGNOSIS — R278 Other lack of coordination: Secondary | ICD-10-CM | POA: Diagnosis not present

## 2020-08-21 DIAGNOSIS — M62561 Muscle wasting and atrophy, not elsewhere classified, right lower leg: Secondary | ICD-10-CM | POA: Diagnosis not present

## 2020-08-21 DIAGNOSIS — K591 Functional diarrhea: Secondary | ICD-10-CM | POA: Diagnosis not present

## 2020-08-21 DIAGNOSIS — E876 Hypokalemia: Secondary | ICD-10-CM | POA: Diagnosis not present

## 2020-08-21 DIAGNOSIS — I10 Essential (primary) hypertension: Secondary | ICD-10-CM | POA: Diagnosis not present

## 2020-08-21 DIAGNOSIS — F3341 Major depressive disorder, recurrent, in partial remission: Secondary | ICD-10-CM | POA: Diagnosis not present

## 2020-08-22 DIAGNOSIS — M810 Age-related osteoporosis without current pathological fracture: Secondary | ICD-10-CM | POA: Diagnosis not present

## 2020-08-22 DIAGNOSIS — E876 Hypokalemia: Secondary | ICD-10-CM | POA: Diagnosis not present

## 2020-08-22 DIAGNOSIS — F3341 Major depressive disorder, recurrent, in partial remission: Secondary | ICD-10-CM | POA: Diagnosis not present

## 2020-08-22 DIAGNOSIS — K591 Functional diarrhea: Secondary | ICD-10-CM | POA: Diagnosis not present

## 2020-08-22 DIAGNOSIS — R278 Other lack of coordination: Secondary | ICD-10-CM | POA: Diagnosis not present

## 2020-08-22 DIAGNOSIS — M6389 Disorders of muscle in diseases classified elsewhere, multiple sites: Secondary | ICD-10-CM | POA: Diagnosis not present

## 2020-08-22 DIAGNOSIS — I10 Essential (primary) hypertension: Secondary | ICD-10-CM | POA: Diagnosis not present

## 2020-08-23 DIAGNOSIS — E876 Hypokalemia: Secondary | ICD-10-CM | POA: Diagnosis not present

## 2020-08-23 DIAGNOSIS — F3341 Major depressive disorder, recurrent, in partial remission: Secondary | ICD-10-CM | POA: Diagnosis not present

## 2020-08-23 DIAGNOSIS — R278 Other lack of coordination: Secondary | ICD-10-CM | POA: Diagnosis not present

## 2020-08-23 DIAGNOSIS — M810 Age-related osteoporosis without current pathological fracture: Secondary | ICD-10-CM | POA: Diagnosis not present

## 2020-08-23 DIAGNOSIS — M6389 Disorders of muscle in diseases classified elsewhere, multiple sites: Secondary | ICD-10-CM | POA: Diagnosis not present

## 2020-08-23 DIAGNOSIS — K591 Functional diarrhea: Secondary | ICD-10-CM | POA: Diagnosis not present

## 2020-08-23 DIAGNOSIS — I10 Essential (primary) hypertension: Secondary | ICD-10-CM | POA: Diagnosis not present

## 2020-08-24 DIAGNOSIS — R41841 Cognitive communication deficit: Secondary | ICD-10-CM | POA: Diagnosis not present

## 2020-08-24 DIAGNOSIS — F4329 Adjustment disorder with other symptoms: Secondary | ICD-10-CM | POA: Diagnosis not present

## 2020-08-24 DIAGNOSIS — I10 Essential (primary) hypertension: Secondary | ICD-10-CM | POA: Diagnosis not present

## 2020-08-26 DIAGNOSIS — M810 Age-related osteoporosis without current pathological fracture: Secondary | ICD-10-CM | POA: Diagnosis not present

## 2020-08-26 DIAGNOSIS — M62562 Muscle wasting and atrophy, not elsewhere classified, left lower leg: Secondary | ICD-10-CM | POA: Diagnosis not present

## 2020-08-26 DIAGNOSIS — I10 Essential (primary) hypertension: Secondary | ICD-10-CM | POA: Diagnosis not present

## 2020-08-26 DIAGNOSIS — F3341 Major depressive disorder, recurrent, in partial remission: Secondary | ICD-10-CM | POA: Diagnosis not present

## 2020-08-26 DIAGNOSIS — K591 Functional diarrhea: Secondary | ICD-10-CM | POA: Diagnosis not present

## 2020-08-26 DIAGNOSIS — E876 Hypokalemia: Secondary | ICD-10-CM | POA: Diagnosis not present

## 2020-08-26 DIAGNOSIS — R278 Other lack of coordination: Secondary | ICD-10-CM | POA: Diagnosis not present

## 2020-08-26 DIAGNOSIS — M62561 Muscle wasting and atrophy, not elsewhere classified, right lower leg: Secondary | ICD-10-CM | POA: Diagnosis not present

## 2020-08-26 DIAGNOSIS — R2689 Other abnormalities of gait and mobility: Secondary | ICD-10-CM | POA: Diagnosis not present

## 2020-08-29 DIAGNOSIS — R2689 Other abnormalities of gait and mobility: Secondary | ICD-10-CM | POA: Diagnosis not present

## 2020-08-29 DIAGNOSIS — R278 Other lack of coordination: Secondary | ICD-10-CM | POA: Diagnosis not present

## 2020-08-29 DIAGNOSIS — F3341 Major depressive disorder, recurrent, in partial remission: Secondary | ICD-10-CM | POA: Diagnosis not present

## 2020-08-29 DIAGNOSIS — M62562 Muscle wasting and atrophy, not elsewhere classified, left lower leg: Secondary | ICD-10-CM | POA: Diagnosis not present

## 2020-08-29 DIAGNOSIS — E876 Hypokalemia: Secondary | ICD-10-CM | POA: Diagnosis not present

## 2020-08-29 DIAGNOSIS — K591 Functional diarrhea: Secondary | ICD-10-CM | POA: Diagnosis not present

## 2020-08-29 DIAGNOSIS — I10 Essential (primary) hypertension: Secondary | ICD-10-CM | POA: Diagnosis not present

## 2020-08-29 DIAGNOSIS — M810 Age-related osteoporosis without current pathological fracture: Secondary | ICD-10-CM | POA: Diagnosis not present

## 2020-08-29 DIAGNOSIS — M6389 Disorders of muscle in diseases classified elsewhere, multiple sites: Secondary | ICD-10-CM | POA: Diagnosis not present

## 2020-08-29 DIAGNOSIS — M62561 Muscle wasting and atrophy, not elsewhere classified, right lower leg: Secondary | ICD-10-CM | POA: Diagnosis not present

## 2020-08-30 DIAGNOSIS — K591 Functional diarrhea: Secondary | ICD-10-CM | POA: Diagnosis not present

## 2020-08-30 DIAGNOSIS — I10 Essential (primary) hypertension: Secondary | ICD-10-CM | POA: Diagnosis not present

## 2020-08-30 DIAGNOSIS — M810 Age-related osteoporosis without current pathological fracture: Secondary | ICD-10-CM | POA: Diagnosis not present

## 2020-08-30 DIAGNOSIS — E876 Hypokalemia: Secondary | ICD-10-CM | POA: Diagnosis not present

## 2020-08-30 DIAGNOSIS — M6389 Disorders of muscle in diseases classified elsewhere, multiple sites: Secondary | ICD-10-CM | POA: Diagnosis not present

## 2020-08-30 DIAGNOSIS — R278 Other lack of coordination: Secondary | ICD-10-CM | POA: Diagnosis not present

## 2020-08-30 DIAGNOSIS — F3341 Major depressive disorder, recurrent, in partial remission: Secondary | ICD-10-CM | POA: Diagnosis not present

## 2020-09-01 ENCOUNTER — Ambulatory Visit (INDEPENDENT_AMBULATORY_CARE_PROVIDER_SITE_OTHER): Payer: Medicare Other | Admitting: Physician Assistant

## 2020-09-01 ENCOUNTER — Other Ambulatory Visit: Payer: Self-pay

## 2020-09-01 ENCOUNTER — Encounter: Payer: Self-pay | Admitting: Physician Assistant

## 2020-09-01 VITALS — BP 124/70 | HR 71 | Ht 67.25 in | Wt 156.0 lb

## 2020-09-01 DIAGNOSIS — M810 Age-related osteoporosis without current pathological fracture: Secondary | ICD-10-CM | POA: Diagnosis not present

## 2020-09-01 DIAGNOSIS — K5900 Constipation, unspecified: Secondary | ICD-10-CM

## 2020-09-01 DIAGNOSIS — R278 Other lack of coordination: Secondary | ICD-10-CM | POA: Diagnosis not present

## 2020-09-01 DIAGNOSIS — F3341 Major depressive disorder, recurrent, in partial remission: Secondary | ICD-10-CM | POA: Diagnosis not present

## 2020-09-01 DIAGNOSIS — K591 Functional diarrhea: Secondary | ICD-10-CM | POA: Diagnosis not present

## 2020-09-01 DIAGNOSIS — E876 Hypokalemia: Secondary | ICD-10-CM | POA: Diagnosis not present

## 2020-09-01 DIAGNOSIS — K5792 Diverticulitis of intestine, part unspecified, without perforation or abscess without bleeding: Secondary | ICD-10-CM

## 2020-09-01 DIAGNOSIS — I10 Essential (primary) hypertension: Secondary | ICD-10-CM | POA: Diagnosis not present

## 2020-09-01 DIAGNOSIS — M6389 Disorders of muscle in diseases classified elsewhere, multiple sites: Secondary | ICD-10-CM | POA: Diagnosis not present

## 2020-09-01 NOTE — Progress Notes (Signed)
Subjective:    Patient ID: Amber Bryant, female    DOB: 22-Sep-1928, 87 y.o.   MRN: [id number]  HPI Mylo is a pleasant 87 year old white female, known to myself and established with Dr. Silverio Decamp.  She had initially been referred by Dr. Hollace Kinnier, DO/[facility] on 07/26/2020 after acute onset of abdominal pain, cramping diarrhea rectal bleeding and weight loss.  Her symptoms had started at the beginning of January.  She underwent CT of the abdomen and pelvis after her initial office visit and this showed a large exophytic cyst in the inferior right kidney measuring 10.6 cm, severe descending and sigmoid colon diverticulosis with diffuse long segment of wall thickening of the sigmoid colon.  Patient is status post hysterectomy.  Findings felt to be consistent with acute or chronic diverticulitis versus ischemic colitis. She was feeling very poorly at the time of initial evaluation, was having a lot of pain, no appetite, weakness and had lost about 11 pounds. She completed a 14-day course of Cipro and Flagyl.  She was seen back in the office on 08/18/2020 with significant improvement though still without much appetite and continuing to have some mild discomfort in the left lower quadrant.  She had required a short assisted living stay at [facility] and is currently I believe in the rehab section.  She says she is feeling much better and has no complaints of abdominal pain today.  Bowel movements are normal and she has not seen any blood in the past few weeks.  She is eating much better but says she still has some difficulty with eating partly because of available food choices.  She has no complaints of nausea.  Her weight is up 2 pounds since her last office visit. She relates that she is hoping to get back to her own apartment soon. She has had a couple of episodes of mild constipation with straining.  She says she used a suppository today and had a good bowel movement.  Review of Systems Pertinent  positive and negative review of systems were noted in the above HPI section.  All other review of systems was otherwise negative.  Outpatient Encounter Medications as of 09/01/2020  Medication Sig  . acetaminophen (TYLENOL) 325 MG tablet Take 650 mg by mouth 3 (three) times daily as needed for mild pain.  . Ascorbic Acid (VITAMIN C) 100 MG tablet Take 100 mg by mouth daily.  Marland Kitchen aspirin EC 81 MG tablet Take 81 mg by mouth daily.  . Cholecalciferol 50 MCG (2000 UT) CAPS Take 1 capsule (2,000 Units total) by mouth daily.  Marland Kitchen levothyroxine (SYNTHROID) 25 MCG tablet Take 1 tablet (25 mcg total) by mouth daily before breakfast.  . loperamide (IMODIUM A-D) 2 MG tablet Take 2 mg by mouth 3 (three) times daily as needed for diarrhea or loose stools.  . lovastatin (MEVACOR) 20 MG tablet Take 1 tablet (20 mg total) by mouth at bedtime.  . ondansetron (ZOFRAN) 4 MG tablet Take 4 mg by mouth every 6 (six) hours as needed for nausea or vomiting.  . triamterene-hydrochlorothiazide (MAXZIDE-25) 37.5-25 MG tablet Take 1 tablet by mouth daily.  Marland Kitchen zinc gluconate 50 MG tablet Take 50 mg by mouth daily.  . [DISCONTINUED] Biotin 10000 MCG TABS Take 1 tablet by mouth daily.  . [DISCONTINUED] potassium chloride 20 MEQ/15ML (10%) SOLN Take 20 mEq by mouth 2 (two) times daily. Dilute with 4 oz of juice per resident preference  . [DISCONTINUED] promethazine (PHENERGAN) 25 MG tablet Take 25 mg  by mouth every 6 (six) hours as needed for nausea or vomiting (If vomiting persists after 2 doses, notify MD/NP).   No facility-administered encounter medications on file as of 09/01/2020.   Allergies  Allergen Reactions  . Ambien [Zolpidem]   . Amoxicillin     UNSPECIFIED REACTION   Has patient had a PCN reaction causing immediate rash, facial/tongue/throat swelling, SOB or lightheadedness with hypotension: Unknown Has patient had a PCN reaction causing severe rash involving mucus membranes or skin necrosis: Unknown Has patient  had a PCN reaction that required hospitalization No Has patient had a PCN reaction occurring within the last 10 years: No If all of the above answers are "NO", then may proceed with Cephalosporin use.    Patient Active Problem List   Diagnosis Date Noted  . Delirium superimposed on dementia 08/19/2020  . Diverticulitis 08/19/2020  . Chronic venous insufficiency 11/13/2017  . Overweight (BMI 25.0-29.9) 11/13/2017  . Senile osteoporosis 08/28/2016  . Constipation 08/27/2016  . Arthritis of right hip 08/20/2016  . Traumatic arthritis of hip, left 08/16/2016  . Hypokalemia 04/21/2016  . Vitamin D deficiency 04/21/2016  . Hypothyroidism   . Hyperlipidemia   . Primary hypertension   . PULMONARY NODULE 11/25/2007   Social History   Socioeconomic History  . Marital status: Widowed    Spouse name: Not on file  . Number of children: Not on file  . Years of education: Not on file  . Highest education level: Not on file  Occupational History  . Not on file  Tobacco Use  . Smoking status: Never Smoker  . Smokeless tobacco: Never Used  Vaping Use  . Vaping Use: Never used  Substance and Sexual Activity  . Alcohol use: No  . Drug use: No  . Sexual activity: Never  Other Topics Concern  . Not on file  Social History Narrative   Tobacco use, amount per day now: NONE   Past tobacco use, amount per day: NONE   How many years did you use tobacco: NONE   Alcohol use (drinks per week): 1   Diet: ONGOING   Do you drink/eat things with caffeine: YES   Marital status: WIDOWED                       What year were you married? 1956   Do you live in a house, apartment, assisted living, condo, trailer, etc.? [place] ([place])   Is it one or more stories? ONE   How many persons live in your home? ONE   Do you have pets in your home?( please list) 3 DOGS   Current or past profession: PUBLISHER   Do you exercise?   YES                               Type and how often? SWIM DAILY    Do you have a living will? YES   Do you have a DNR form?   YES                                If not, do you want to discuss one?   Do you have signed POA/HPOA forms?  YES                      If so, please bring to you  appointment   Has 2 adopted children   Social Determinants of Health   Financial Resource Strain: Not on file  Food Insecurity: Not on file  Transportation Needs: Not on file  Physical Activity: Not on file  Stress: Not on file  Social Connections: Not on file  Intimate Partner Violence: Not on file    Ms. Bottomley family history includes Colon cancer in her mother; Heart disease in her father.      Objective:    Vitals:   09/01/20 1358  BP: 124/70  Pulse: 71    Physical Exam Well-developed well-nourished elderly white female, ambulating with walker in no acute distress.  She is here alone today  Weight, 156, up 2 pounds BMI 24.2  HEENT; nontraumatic normocephalic, EOMI, PE R LA, sclera anicteric. Oropharynx; not examined today Neck; supple, no JVD Cardiovascular; regular rate and rhythm with S1-S2, no murmur rub or gallop Pulmonary; Clear bilaterally Abdomen; soft, nontender, nondistended, no palpable mass or hepatosplenomegaly, bowel sounds are active Rectal; not done Skin; benign exam, no jaundice rash or appreciable lesions Extremities; no clubbing cyanosis or edema skin warm and dry Neuro/Psych; alert and oriented x4, grossly nonfocal mood and affect appropriate       Assessment & Plan:   #23 87 year old white female with resolved prolonged episode of segmental ischemic colitis/diverticulitis. Symptoms completely resolved at this point.  #2 constipation #3 dementia 4.  Osteoarthritis 5.  Hypertension  Plan; Continue efforts at oral intake which has significantly improved Increase water intake, try for 60 ounces per day She will start MiraLAX 17 g in 8 ounces of water daily on a as needed basis.  I do not think she needs this every day at  this point but if has persisting constipation okay to use every day. Avoid popcorn and nuts She will follow up with GI on a as needed basis.  Younique Casad S Yetunde Leis PA-C 09/01/2020   Cc: Gayland Curry, DO

## 2020-09-01 NOTE — Patient Instructions (Signed)
If you are age 85 or older, your body mass index should be between 23-30. Your Body mass index is 24.25 kg/m. If this is out of the aforementioned range listed, please consider follow up with your Primary Care Provider.  If you are age 65 or younger, your body mass index should be between 19-25. Your Body mass index is 24.25 kg/m. If this is out of the aformentioned range listed, please consider follow up with your Primary Care Provider.   START Miralax 17 grams (1 capful) in 8 ounces of liquid daily as needed for constipation.  Follow as needed.  Thank you for entrusting me with your care and choosing [facility].  Amy Esterwood, PA-C

## 2020-09-05 DIAGNOSIS — E876 Hypokalemia: Secondary | ICD-10-CM | POA: Diagnosis not present

## 2020-09-05 DIAGNOSIS — K591 Functional diarrhea: Secondary | ICD-10-CM | POA: Diagnosis not present

## 2020-09-05 DIAGNOSIS — F3341 Major depressive disorder, recurrent, in partial remission: Secondary | ICD-10-CM | POA: Diagnosis not present

## 2020-09-05 DIAGNOSIS — M62562 Muscle wasting and atrophy, not elsewhere classified, left lower leg: Secondary | ICD-10-CM | POA: Diagnosis not present

## 2020-09-05 DIAGNOSIS — I10 Essential (primary) hypertension: Secondary | ICD-10-CM | POA: Diagnosis not present

## 2020-09-05 DIAGNOSIS — M810 Age-related osteoporosis without current pathological fracture: Secondary | ICD-10-CM | POA: Diagnosis not present

## 2020-09-05 DIAGNOSIS — R2689 Other abnormalities of gait and mobility: Secondary | ICD-10-CM | POA: Diagnosis not present

## 2020-09-05 DIAGNOSIS — M62561 Muscle wasting and atrophy, not elsewhere classified, right lower leg: Secondary | ICD-10-CM | POA: Diagnosis not present

## 2020-09-05 DIAGNOSIS — R278 Other lack of coordination: Secondary | ICD-10-CM | POA: Diagnosis not present

## 2020-09-08 DIAGNOSIS — K591 Functional diarrhea: Secondary | ICD-10-CM | POA: Diagnosis not present

## 2020-09-08 DIAGNOSIS — E876 Hypokalemia: Secondary | ICD-10-CM | POA: Diagnosis not present

## 2020-09-08 DIAGNOSIS — I10 Essential (primary) hypertension: Secondary | ICD-10-CM | POA: Diagnosis not present

## 2020-09-08 DIAGNOSIS — M810 Age-related osteoporosis without current pathological fracture: Secondary | ICD-10-CM | POA: Diagnosis not present

## 2020-09-08 DIAGNOSIS — F3341 Major depressive disorder, recurrent, in partial remission: Secondary | ICD-10-CM | POA: Diagnosis not present

## 2020-09-08 DIAGNOSIS — R278 Other lack of coordination: Secondary | ICD-10-CM | POA: Diagnosis not present

## 2020-09-08 DIAGNOSIS — M6389 Disorders of muscle in diseases classified elsewhere, multiple sites: Secondary | ICD-10-CM | POA: Diagnosis not present

## 2020-09-12 DIAGNOSIS — M810 Age-related osteoporosis without current pathological fracture: Secondary | ICD-10-CM | POA: Diagnosis not present

## 2020-09-12 DIAGNOSIS — E876 Hypokalemia: Secondary | ICD-10-CM | POA: Diagnosis not present

## 2020-09-12 DIAGNOSIS — F3341 Major depressive disorder, recurrent, in partial remission: Secondary | ICD-10-CM | POA: Diagnosis not present

## 2020-09-12 DIAGNOSIS — K591 Functional diarrhea: Secondary | ICD-10-CM | POA: Diagnosis not present

## 2020-09-12 DIAGNOSIS — I10 Essential (primary) hypertension: Secondary | ICD-10-CM | POA: Diagnosis not present

## 2020-09-12 DIAGNOSIS — R278 Other lack of coordination: Secondary | ICD-10-CM | POA: Diagnosis not present

## 2020-09-12 DIAGNOSIS — M6389 Disorders of muscle in diseases classified elsewhere, multiple sites: Secondary | ICD-10-CM | POA: Diagnosis not present

## 2020-09-12 NOTE — Progress Notes (Signed)
Reviewed and agree with documentation and assessment and plan. K. Veena Cyra Spader , MD   

## 2020-09-13 DIAGNOSIS — M810 Age-related osteoporosis without current pathological fracture: Secondary | ICD-10-CM | POA: Diagnosis not present

## 2020-09-13 DIAGNOSIS — E876 Hypokalemia: Secondary | ICD-10-CM | POA: Diagnosis not present

## 2020-09-13 DIAGNOSIS — M6389 Disorders of muscle in diseases classified elsewhere, multiple sites: Secondary | ICD-10-CM | POA: Diagnosis not present

## 2020-09-13 DIAGNOSIS — K591 Functional diarrhea: Secondary | ICD-10-CM | POA: Diagnosis not present

## 2020-09-13 DIAGNOSIS — R278 Other lack of coordination: Secondary | ICD-10-CM | POA: Diagnosis not present

## 2020-09-13 DIAGNOSIS — I10 Essential (primary) hypertension: Secondary | ICD-10-CM | POA: Diagnosis not present

## 2020-09-13 DIAGNOSIS — F3341 Major depressive disorder, recurrent, in partial remission: Secondary | ICD-10-CM | POA: Diagnosis not present

## 2020-09-14 DIAGNOSIS — K591 Functional diarrhea: Secondary | ICD-10-CM | POA: Diagnosis not present

## 2020-09-14 DIAGNOSIS — E876 Hypokalemia: Secondary | ICD-10-CM | POA: Diagnosis not present

## 2020-09-14 DIAGNOSIS — R278 Other lack of coordination: Secondary | ICD-10-CM | POA: Diagnosis not present

## 2020-09-14 DIAGNOSIS — M810 Age-related osteoporosis without current pathological fracture: Secondary | ICD-10-CM | POA: Diagnosis not present

## 2020-09-14 DIAGNOSIS — M6389 Disorders of muscle in diseases classified elsewhere, multiple sites: Secondary | ICD-10-CM | POA: Diagnosis not present

## 2020-09-14 DIAGNOSIS — F3341 Major depressive disorder, recurrent, in partial remission: Secondary | ICD-10-CM | POA: Diagnosis not present

## 2020-09-14 DIAGNOSIS — I10 Essential (primary) hypertension: Secondary | ICD-10-CM | POA: Diagnosis not present

## 2020-09-15 DIAGNOSIS — R278 Other lack of coordination: Secondary | ICD-10-CM | POA: Diagnosis not present

## 2020-09-15 DIAGNOSIS — M810 Age-related osteoporosis without current pathological fracture: Secondary | ICD-10-CM | POA: Diagnosis not present

## 2020-09-15 DIAGNOSIS — E876 Hypokalemia: Secondary | ICD-10-CM | POA: Diagnosis not present

## 2020-09-15 DIAGNOSIS — I10 Essential (primary) hypertension: Secondary | ICD-10-CM | POA: Diagnosis not present

## 2020-09-15 DIAGNOSIS — K591 Functional diarrhea: Secondary | ICD-10-CM | POA: Diagnosis not present

## 2020-09-15 DIAGNOSIS — F3341 Major depressive disorder, recurrent, in partial remission: Secondary | ICD-10-CM | POA: Diagnosis not present

## 2020-09-15 DIAGNOSIS — M6389 Disorders of muscle in diseases classified elsewhere, multiple sites: Secondary | ICD-10-CM | POA: Diagnosis not present

## 2020-09-16 DIAGNOSIS — K591 Functional diarrhea: Secondary | ICD-10-CM | POA: Diagnosis not present

## 2020-09-16 DIAGNOSIS — R278 Other lack of coordination: Secondary | ICD-10-CM | POA: Diagnosis not present

## 2020-09-16 DIAGNOSIS — M810 Age-related osteoporosis without current pathological fracture: Secondary | ICD-10-CM | POA: Diagnosis not present

## 2020-09-16 DIAGNOSIS — F3341 Major depressive disorder, recurrent, in partial remission: Secondary | ICD-10-CM | POA: Diagnosis not present

## 2020-09-16 DIAGNOSIS — E876 Hypokalemia: Secondary | ICD-10-CM | POA: Diagnosis not present

## 2020-09-16 DIAGNOSIS — M6389 Disorders of muscle in diseases classified elsewhere, multiple sites: Secondary | ICD-10-CM | POA: Diagnosis not present

## 2020-09-16 DIAGNOSIS — I10 Essential (primary) hypertension: Secondary | ICD-10-CM | POA: Diagnosis not present

## 2020-09-19 DIAGNOSIS — F3341 Major depressive disorder, recurrent, in partial remission: Secondary | ICD-10-CM | POA: Diagnosis not present

## 2020-09-19 DIAGNOSIS — I10 Essential (primary) hypertension: Secondary | ICD-10-CM | POA: Diagnosis not present

## 2020-09-19 DIAGNOSIS — E876 Hypokalemia: Secondary | ICD-10-CM | POA: Diagnosis not present

## 2020-09-19 DIAGNOSIS — M6389 Disorders of muscle in diseases classified elsewhere, multiple sites: Secondary | ICD-10-CM | POA: Diagnosis not present

## 2020-09-19 DIAGNOSIS — K591 Functional diarrhea: Secondary | ICD-10-CM | POA: Diagnosis not present

## 2020-09-19 DIAGNOSIS — R278 Other lack of coordination: Secondary | ICD-10-CM | POA: Diagnosis not present

## 2020-09-19 DIAGNOSIS — M810 Age-related osteoporosis without current pathological fracture: Secondary | ICD-10-CM | POA: Diagnosis not present

## 2020-09-21 DIAGNOSIS — E876 Hypokalemia: Secondary | ICD-10-CM | POA: Diagnosis not present

## 2020-09-21 DIAGNOSIS — F3341 Major depressive disorder, recurrent, in partial remission: Secondary | ICD-10-CM | POA: Diagnosis not present

## 2020-09-21 DIAGNOSIS — R278 Other lack of coordination: Secondary | ICD-10-CM | POA: Diagnosis not present

## 2020-09-21 DIAGNOSIS — M810 Age-related osteoporosis without current pathological fracture: Secondary | ICD-10-CM | POA: Diagnosis not present

## 2020-09-21 DIAGNOSIS — I10 Essential (primary) hypertension: Secondary | ICD-10-CM | POA: Diagnosis not present

## 2020-09-21 DIAGNOSIS — M6389 Disorders of muscle in diseases classified elsewhere, multiple sites: Secondary | ICD-10-CM | POA: Diagnosis not present

## 2020-09-21 DIAGNOSIS — K591 Functional diarrhea: Secondary | ICD-10-CM | POA: Diagnosis not present

## 2020-09-22 DIAGNOSIS — F3341 Major depressive disorder, recurrent, in partial remission: Secondary | ICD-10-CM | POA: Diagnosis not present

## 2020-09-22 DIAGNOSIS — E876 Hypokalemia: Secondary | ICD-10-CM | POA: Diagnosis not present

## 2020-09-22 DIAGNOSIS — M6389 Disorders of muscle in diseases classified elsewhere, multiple sites: Secondary | ICD-10-CM | POA: Diagnosis not present

## 2020-09-22 DIAGNOSIS — I10 Essential (primary) hypertension: Secondary | ICD-10-CM | POA: Diagnosis not present

## 2020-09-22 DIAGNOSIS — K591 Functional diarrhea: Secondary | ICD-10-CM | POA: Diagnosis not present

## 2020-09-22 DIAGNOSIS — M810 Age-related osteoporosis without current pathological fracture: Secondary | ICD-10-CM | POA: Diagnosis not present

## 2020-09-22 DIAGNOSIS — R278 Other lack of coordination: Secondary | ICD-10-CM | POA: Diagnosis not present

## 2020-09-23 ENCOUNTER — Non-Acute Institutional Stay (SKILLED_NURSING_FACILITY): Payer: Medicare Other | Admitting: Adult Health

## 2020-09-23 ENCOUNTER — Encounter: Payer: Self-pay | Admitting: Adult Health

## 2020-09-23 DIAGNOSIS — D638 Anemia in other chronic diseases classified elsewhere: Secondary | ICD-10-CM | POA: Diagnosis not present

## 2020-09-23 DIAGNOSIS — I10 Essential (primary) hypertension: Secondary | ICD-10-CM

## 2020-09-23 DIAGNOSIS — R531 Weakness: Secondary | ICD-10-CM | POA: Diagnosis not present

## 2020-09-23 DIAGNOSIS — R413 Other amnesia: Secondary | ICD-10-CM

## 2020-09-23 DIAGNOSIS — K5900 Constipation, unspecified: Secondary | ICD-10-CM | POA: Diagnosis not present

## 2020-09-23 DIAGNOSIS — I872 Venous insufficiency (chronic) (peripheral): Secondary | ICD-10-CM

## 2020-09-23 DIAGNOSIS — E7800 Pure hypercholesterolemia, unspecified: Secondary | ICD-10-CM | POA: Diagnosis not present

## 2020-09-23 NOTE — Addendum Note (Signed)
Addended by: Barnie Mort on: 09/23/2020 12:43 PM   Modules accepted: Orders

## 2020-09-23 NOTE — Progress Notes (Addendum)
Location:    [place] Room Number: 156 Place of Service:  SNF [phone])  Provider: Royal Hawthorn, NP  PCP: Virgie Dad, MD Patient Care Team: Virgie Dad, MD as PCP - General (Internal Medicine) Sharyne Peach, MD as Consulting Physician (Ophthalmology) Tania Ade, MD as Consulting Physician (Orthopedic Surgery)  Extended Emergency Contact Information Primary Emergency Contact: [place] Phone: [phone] Relation: Daughter  Code Status: Full Code Goals of care:  Advanced Directive information Advanced Directives 08/09/2020  Does Patient Have a Medical Advance Directive? Yes  Type of Paramedic of [place];Living will  Does patient want to make changes to medical advance directive? No - Patient declined  Copy of [place] in Chart? Yes - validated most recent copy scanned in chart (See row information)  Pre-existing out of facility DNR order (yellow form or pink MOST form) -     Allergies  Allergen Reactions  . Ambien [Zolpidem]   . Amoxicillin     UNSPECIFIED REACTION   Has patient had a PCN reaction causing immediate rash, facial/tongue/throat swelling, SOB or lightheadedness with hypotension: Unknown Has patient had a PCN reaction causing severe rash involving mucus membranes or skin necrosis: Unknown Has patient had a PCN reaction that required hospitalization No Has patient had a PCN reaction occurring within the last 10 years: No If all of the above answers are "NO", then may proceed with Cephalosporin use.     Chief Complaint  Patient presents with  . Discharge Note    Discharge     HPI:  87 y.o. female seen for discharge from [facility] to enhanced assisted living. She was admitted due to weakness/dehydration associated with possible colitis vs diverticulitis. She was treated with Cipro and Flagyl for 14 days. [place] she had some memory loss and lack of motivation and had home care for support. MMSE 22/30 on admit.  After therapy and treatment for GI issues it was determined that she would be best suited to move to enhanced assisted living. At this time she is not having any diarrhea or abd pain. She is eating and drinking well. She is continent of urine and independent in ambulation  She had a pressure injury to her buttocks that is healing and is no longer painful.  During her stay her bp was low and Maxzide was decreased to 1/2 tab. Now her BP is in the 811-914 range systolic and she has more edema and has gained 5 lbs in the past month. She reports she has been eating from [employer].     Past Medical History:  Diagnosis Date  . Arthritis   . Closed left femoral fracture ([place]) 2012   s/p ORIF, Dr. Tamera Punt  . Closed left radial fracture 2012   s/p reduction  . Complication of anesthesia 08/14/2016   not able to think clear for a long time afterwards  . Essential hypertension   . Hyperlipidemia   . Hypothyroidism     Past Surgical History:  Procedure Laterality Date  . ABDOMINAL HYSTERECTOMY    . CATARACT EXTRACTION W/ INTRAOCULAR LENS  IMPLANT, BILATERAL Bilateral 2014  . COLONOSCOPY    . ORIF FEMUR FRACTURE Left 04/08/2011   Dr. Tamera Punt  . TOTAL HIP ARTHROPLASTY Left 08/20/2016   Procedure: TOTAL HIP ARTHROPLASTY POSTERIOR REMOVE [place] NAIL;  Surgeon: Frederik Pear, MD;  Location: [facility];  Service: Orthopedics;  Laterality: Left;  reports that she has never smoked. She has never used smokeless tobacco. She reports that she does not drink alcohol and does not use drugs. Social History   Socioeconomic History  . Marital status: Widowed    Spouse name: Not on file  . Number of children: Not on file  . Years of education: Not on file  . Highest education level: Not on file  Occupational History  . Not on file  Tobacco Use  . Smoking status: Never Smoker  . Smokeless  tobacco: Never Used  Vaping Use  . Vaping Use: Never used  Substance and Sexual Activity  . Alcohol use: No  . Drug use: No  . Sexual activity: Never  Other Topics Concern  . Not on file  Social History Narrative   Tobacco use, amount per day now: NONE   Past tobacco use, amount per day: NONE   How many years did you use tobacco: NONE   Alcohol use (drinks per week): 1   Diet: ONGOING   Do you drink/eat things with caffeine: YES   Marital status: WIDOWED                       What year were you married? 1956   Do you live in a house, apartment, assisted living, condo, trailer, etc.? [place])   Is it one or more stories? ONE   How many persons live in your home? ONE   Do you have pets in your home?( please list) 3 DOGS   Current or past profession: PUBLISHER   Do you exercise?   YES                               Type and how often? SWIM DAILY   Do you have a living will? YES   Do you have a DNR form?   YES                                If not, do you want to discuss one?   Do you have signed POA/HPOA forms?  YES                      If so, please bring to you appointment   Has 2 adopted children   Social Determinants of Health   Financial Resource Strain: Not on file  Food Insecurity: Not on file  Transportation Needs: Not on file  Physical Activity: Not on file  Stress: Not on file  Social Connections: Not on file  Intimate Partner Violence: Not on file   Functional Status Survey:    Allergies  Allergen Reactions  . Ambien [Zolpidem]   . Amoxicillin     UNSPECIFIED REACTION   Has patient had a PCN reaction causing immediate rash, facial/tongue/throat swelling, SOB or lightheadedness with hypotension: Unknown Has patient had a PCN reaction causing severe rash involving mucus membranes or skin necrosis: Unknown Has patient had a PCN reaction that required hospitalization No Has patient had a PCN reaction occurring within the last 10 years:  No If all of the above answers are "NO", then may proceed with Cephalosporin use.     Pertinent  Health Maintenance Due  Topic Date Due  . INFLUENZA VACCINE  01/16/2021  . DEXA SCAN  Completed  . PNA vac Low Risk Adult  Completed    Medications: Allergies as of 09/23/2020      Reactions   Ambien [zolpidem]    Amoxicillin    UNSPECIFIED REACTION  Has patient had a PCN reaction causing immediate rash, facial/tongue/throat swelling, SOB or lightheadedness with hypotension: Unknown Has patient had a PCN reaction causing severe rash involving mucus membranes or skin necrosis: Unknown Has patient had a PCN reaction that required hospitalization No Has patient had a PCN reaction occurring within the last 10 years: No If all of the above answers are "NO", then may proceed with Cephalosporin use.      Medication List       Accurate as of September 23, 2020 11:12 AM. If you have any questions, ask your nurse or doctor.        STOP taking these medications   saccharomyces boulardii 250 MG capsule Commonly known as: FLORASTOR Stopped by: Royal Hawthorn, NP   zinc gluconate 50 MG tablet Stopped by: Royal Hawthorn, NP     TAKE these medications   acetaminophen 325 MG tablet Commonly known as: TYLENOL Take 650 mg by mouth 3 (three) times daily as needed for mild pain.   aspirin EC 81 MG tablet Take 81 mg by mouth daily.   Cholecalciferol 50 MCG (2000 UT) Caps Take 1 capsule (2,000 Units total) by mouth daily.   levothyroxine 25 MCG tablet Commonly known as: SYNTHROID Take 1 tablet (25 mcg total) by mouth daily before breakfast.   loperamide 2 MG tablet Commonly known as: IMODIUM A-D Take 2 mg by mouth 3 (three) times daily as needed for diarrhea or loose stools.   lovastatin 20 MG tablet Commonly known as: MEVACOR Take 1 tablet (20 mg total) by mouth at bedtime.   ondansetron 4 MG tablet Commonly known as: ZOFRAN Take 4 mg by mouth every 6 (six) hours as needed for nausea  or vomiting.   polyethylene glycol 17 g packet Commonly known as: MIRALAX / GLYCOLAX Take 17 g by mouth daily.   triamterene-hydrochlorothiazide 37.5-25 MG tablet Commonly known as: MAXZIDE-25 Take 0.5 tablets by mouth daily. What changed: Another medication with the same name was removed. Continue taking this medication, and follow the directions you see here. Changed by: Royal Hawthorn, NP   Vitamin C 125 MG Chew Chew 125 mg by mouth daily.       Review of Systems  Constitutional: Negative for activity change, appetite change, chills, diaphoresis, fatigue, fever and unexpected weight change.  HENT: Negative for congestion.   Respiratory: Negative for cough, shortness of breath and wheezing.   Cardiovascular: Positive for leg swelling. Negative for chest pain and palpitations.  Gastrointestinal: Negative for abdominal distention, abdominal pain, constipation and diarrhea.  Genitourinary: Negative for difficulty urinating and dysuria.  Musculoskeletal: Positive for gait problem (uses a walker ). Negative for arthralgias, back pain, joint swelling and myalgias.  Skin: Positive for wound.  Neurological: Negative for dizziness, tremors, seizures, syncope, facial asymmetry, speech difficulty, weakness, light-headedness, numbness and headaches.  Psychiatric/Behavioral: Negative for agitation, behavioral problems and confusion.       Memory loss    Vitals:   09/23/20 1004  BP: 133/67  Pulse: 64  Resp: 14  Temp: 97.9 F (36.6 C)  SpO2: 98%  Weight: 158 lb 6.4 oz (71.8 kg)  Height: 5' 6"  (1.676 m)   Body mass index is 25.57 kg/m. Physical Exam Vitals and nursing note reviewed.  Constitutional:      General: She is not in acute distress.  Appearance: She is not diaphoretic.  HENT:     Head: Normocephalic and atraumatic.  Neck:     Vascular: No JVD.  Cardiovascular:     Rate and Rhythm: Normal rate and regular rhythm.     Heart sounds: Murmur heard.    Pulmonary:      Effort: Pulmonary effort is normal. No respiratory distress.     Breath sounds: Normal breath sounds. No wheezing.  Abdominal:     General: Bowel sounds are normal. There is no distension.     Palpations: Abdomen is soft.     Tenderness: There is no abdominal tenderness.  Musculoskeletal:     Right lower leg: Edema (+1) present.     Left lower leg: Edema (+1) present.  Skin:    General: Skin is warm and dry.  Neurological:     Mental Status: She is alert and oriented to person, place, and time.  Psychiatric:        Mood and Affect: Mood normal.     Labs reviewed: Basic Metabolic Panel: Recent Labs    07/26/20 1150 08/08/20 0000 08/17/20 0000  NA 135 135* 138  K 3.3* 4.4 3.9  CL 96 97* 99  CO2 28 25* 27*  GLUCOSE 110*  --   --   BUN 16 18 16   CREATININE 0.99 1.1 0.8  CALCIUM 9.5 8.9 9.1   Liver Function Tests: Recent Labs    06/30/20 0000 07/26/20 1150 08/08/20 0000  AST 17 15 14   ALT 10 15 9   ALKPHOS  --  169* 152*  BILITOT  --  0.8  --   PROT  --  7.4  --   ALBUMIN  --  3.4*  --    No results for input(s): LIPASE, AMYLASE in the last 8760 hours. No results for input(s): AMMONIA in the last 8760 hours. CBC: Recent Labs    06/30/20 0000 07/26/20 1150 08/08/20 0000  WBC 7.2 11.3* 7.5  NEUTROABS  --  8.1*  --   HGB 12.5 11.8* 10.7*  HCT 37 35.4* 32*  MCV  --  91.0  --   PLT 277 559.0* 586*   Cardiac Enzymes: No results for input(s): CKTOTAL, CKMB, CKMBINDEX, TROPONINI in the last 8760 hours. BNP: Invalid input(s): POCBNP CBG: No results for input(s): GLUCAP in the last 8760 hours.  Procedures and Imaging Studies During Stay: No results found.  Assessment/Plan:    1. Weakness Improved with therapy Ready for discharge to EAL F/U in the clinic in 3 months   2. Memory loss Will benefit from AL support   3. Primary hypertension D/C Maxzide and begin Hctz 25 mg qd  F/U BMP 2 weeks  Monitor bp qd x 1 week then report to psc  4. Chronic  venous insufficiency Recommend compression hose but she states she can not wear them Continue elevation Low sodium diet See #3  5. Constipation, unspecified constipation type Continue miralax daily  6. Pure hypercholesterolemia Lab Results  Component Value Date   LDLCALC 108 07/02/2019  Continue mevacor 20 mg qd   7. Anemia of chronic disease Lab Results  Component Value Date   HGB 10.7 (A) 08/08/2020  Normocyctic and chronic but Slight trend down due to acute illness with mild blood loss.  Will f/u in clinic   Discharge review, assessment, plan, and coordination took >30 min  Future labs/tests needed:  BMP

## 2020-09-26 DIAGNOSIS — K591 Functional diarrhea: Secondary | ICD-10-CM | POA: Diagnosis not present

## 2020-09-26 DIAGNOSIS — F3341 Major depressive disorder, recurrent, in partial remission: Secondary | ICD-10-CM | POA: Diagnosis not present

## 2020-09-26 DIAGNOSIS — M810 Age-related osteoporosis without current pathological fracture: Secondary | ICD-10-CM | POA: Diagnosis not present

## 2020-09-26 DIAGNOSIS — R278 Other lack of coordination: Secondary | ICD-10-CM | POA: Diagnosis not present

## 2020-09-26 DIAGNOSIS — E876 Hypokalemia: Secondary | ICD-10-CM | POA: Diagnosis not present

## 2020-09-26 DIAGNOSIS — M6389 Disorders of muscle in diseases classified elsewhere, multiple sites: Secondary | ICD-10-CM | POA: Diagnosis not present

## 2020-09-26 DIAGNOSIS — I10 Essential (primary) hypertension: Secondary | ICD-10-CM | POA: Diagnosis not present

## 2020-09-27 DIAGNOSIS — R278 Other lack of coordination: Secondary | ICD-10-CM | POA: Diagnosis not present

## 2020-09-27 DIAGNOSIS — I10 Essential (primary) hypertension: Secondary | ICD-10-CM | POA: Diagnosis not present

## 2020-09-27 DIAGNOSIS — M6389 Disorders of muscle in diseases classified elsewhere, multiple sites: Secondary | ICD-10-CM | POA: Diagnosis not present

## 2020-09-27 DIAGNOSIS — E876 Hypokalemia: Secondary | ICD-10-CM | POA: Diagnosis not present

## 2020-09-27 DIAGNOSIS — F3341 Major depressive disorder, recurrent, in partial remission: Secondary | ICD-10-CM | POA: Diagnosis not present

## 2020-09-27 DIAGNOSIS — M810 Age-related osteoporosis without current pathological fracture: Secondary | ICD-10-CM | POA: Diagnosis not present

## 2020-09-27 DIAGNOSIS — K591 Functional diarrhea: Secondary | ICD-10-CM | POA: Diagnosis not present

## 2020-09-28 DIAGNOSIS — F3341 Major depressive disorder, recurrent, in partial remission: Secondary | ICD-10-CM | POA: Diagnosis not present

## 2020-09-28 DIAGNOSIS — M810 Age-related osteoporosis without current pathological fracture: Secondary | ICD-10-CM | POA: Diagnosis not present

## 2020-09-28 DIAGNOSIS — E876 Hypokalemia: Secondary | ICD-10-CM | POA: Diagnosis not present

## 2020-09-28 DIAGNOSIS — M6389 Disorders of muscle in diseases classified elsewhere, multiple sites: Secondary | ICD-10-CM | POA: Diagnosis not present

## 2020-09-28 DIAGNOSIS — I10 Essential (primary) hypertension: Secondary | ICD-10-CM | POA: Diagnosis not present

## 2020-09-28 DIAGNOSIS — R278 Other lack of coordination: Secondary | ICD-10-CM | POA: Diagnosis not present

## 2020-09-28 DIAGNOSIS — K591 Functional diarrhea: Secondary | ICD-10-CM | POA: Diagnosis not present

## 2020-09-29 DIAGNOSIS — R278 Other lack of coordination: Secondary | ICD-10-CM | POA: Diagnosis not present

## 2020-09-29 DIAGNOSIS — F3341 Major depressive disorder, recurrent, in partial remission: Secondary | ICD-10-CM | POA: Diagnosis not present

## 2020-09-29 DIAGNOSIS — M810 Age-related osteoporosis without current pathological fracture: Secondary | ICD-10-CM | POA: Diagnosis not present

## 2020-09-29 DIAGNOSIS — I10 Essential (primary) hypertension: Secondary | ICD-10-CM | POA: Diagnosis not present

## 2020-09-29 DIAGNOSIS — M6389 Disorders of muscle in diseases classified elsewhere, multiple sites: Secondary | ICD-10-CM | POA: Diagnosis not present

## 2020-09-29 DIAGNOSIS — E876 Hypokalemia: Secondary | ICD-10-CM | POA: Diagnosis not present

## 2020-09-29 DIAGNOSIS — K591 Functional diarrhea: Secondary | ICD-10-CM | POA: Diagnosis not present

## 2020-10-04 DIAGNOSIS — M810 Age-related osteoporosis without current pathological fracture: Secondary | ICD-10-CM | POA: Diagnosis not present

## 2020-10-04 DIAGNOSIS — M6389 Disorders of muscle in diseases classified elsewhere, multiple sites: Secondary | ICD-10-CM | POA: Diagnosis not present

## 2020-10-04 DIAGNOSIS — E876 Hypokalemia: Secondary | ICD-10-CM | POA: Diagnosis not present

## 2020-10-04 DIAGNOSIS — F3341 Major depressive disorder, recurrent, in partial remission: Secondary | ICD-10-CM | POA: Diagnosis not present

## 2020-10-04 DIAGNOSIS — R278 Other lack of coordination: Secondary | ICD-10-CM | POA: Diagnosis not present

## 2020-10-04 DIAGNOSIS — I10 Essential (primary) hypertension: Secondary | ICD-10-CM | POA: Diagnosis not present

## 2020-10-04 DIAGNOSIS — K591 Functional diarrhea: Secondary | ICD-10-CM | POA: Diagnosis not present

## 2020-10-06 DIAGNOSIS — R278 Other lack of coordination: Secondary | ICD-10-CM | POA: Diagnosis not present

## 2020-10-06 DIAGNOSIS — M6389 Disorders of muscle in diseases classified elsewhere, multiple sites: Secondary | ICD-10-CM | POA: Diagnosis not present

## 2020-10-06 DIAGNOSIS — E876 Hypokalemia: Secondary | ICD-10-CM | POA: Diagnosis not present

## 2020-10-06 DIAGNOSIS — I10 Essential (primary) hypertension: Secondary | ICD-10-CM | POA: Diagnosis not present

## 2020-10-06 DIAGNOSIS — F3341 Major depressive disorder, recurrent, in partial remission: Secondary | ICD-10-CM | POA: Diagnosis not present

## 2020-10-06 DIAGNOSIS — K591 Functional diarrhea: Secondary | ICD-10-CM | POA: Diagnosis not present

## 2020-10-06 DIAGNOSIS — M810 Age-related osteoporosis without current pathological fracture: Secondary | ICD-10-CM | POA: Diagnosis not present

## 2020-10-07 DIAGNOSIS — F3341 Major depressive disorder, recurrent, in partial remission: Secondary | ICD-10-CM | POA: Diagnosis not present

## 2020-10-07 DIAGNOSIS — I10 Essential (primary) hypertension: Secondary | ICD-10-CM | POA: Diagnosis not present

## 2020-10-07 DIAGNOSIS — R278 Other lack of coordination: Secondary | ICD-10-CM | POA: Diagnosis not present

## 2020-10-07 DIAGNOSIS — M6389 Disorders of muscle in diseases classified elsewhere, multiple sites: Secondary | ICD-10-CM | POA: Diagnosis not present

## 2020-10-07 DIAGNOSIS — E876 Hypokalemia: Secondary | ICD-10-CM | POA: Diagnosis not present

## 2020-10-07 DIAGNOSIS — K591 Functional diarrhea: Secondary | ICD-10-CM | POA: Diagnosis not present

## 2020-10-07 DIAGNOSIS — M810 Age-related osteoporosis without current pathological fracture: Secondary | ICD-10-CM | POA: Diagnosis not present

## 2020-10-08 DIAGNOSIS — I10 Essential (primary) hypertension: Secondary | ICD-10-CM | POA: Diagnosis not present

## 2020-10-08 LAB — BASIC METABOLIC PANEL
BUN: 21 (ref 4–21)
CO2: 28 — AB (ref 13–22)
Chloride: 108 (ref 99–108)
Creatinine: 0.8 (ref 0.5–1.1)
Glucose: 99
Potassium: 3.9 (ref 3.4–5.3)
Sodium: 144 (ref 137–147)

## 2020-10-08 LAB — COMPREHENSIVE METABOLIC PANEL: Calcium: 9.4 (ref 8.7–10.7)

## 2020-10-08 NOTE — Progress Notes (Signed)
Reviewed and agree with documentation and assessment and plan. K. Veena Raymundo Rout , MD   

## 2020-10-13 DIAGNOSIS — F3341 Major depressive disorder, recurrent, in partial remission: Secondary | ICD-10-CM | POA: Diagnosis not present

## 2020-10-13 DIAGNOSIS — E876 Hypokalemia: Secondary | ICD-10-CM | POA: Diagnosis not present

## 2020-10-13 DIAGNOSIS — K591 Functional diarrhea: Secondary | ICD-10-CM | POA: Diagnosis not present

## 2020-10-13 DIAGNOSIS — R278 Other lack of coordination: Secondary | ICD-10-CM | POA: Diagnosis not present

## 2020-10-13 DIAGNOSIS — M810 Age-related osteoporosis without current pathological fracture: Secondary | ICD-10-CM | POA: Diagnosis not present

## 2020-10-13 DIAGNOSIS — I10 Essential (primary) hypertension: Secondary | ICD-10-CM | POA: Diagnosis not present

## 2020-10-13 DIAGNOSIS — M6389 Disorders of muscle in diseases classified elsewhere, multiple sites: Secondary | ICD-10-CM | POA: Diagnosis not present

## 2020-10-18 DIAGNOSIS — I10 Essential (primary) hypertension: Secondary | ICD-10-CM | POA: Diagnosis not present

## 2020-10-18 DIAGNOSIS — K591 Functional diarrhea: Secondary | ICD-10-CM | POA: Diagnosis not present

## 2020-10-18 DIAGNOSIS — E876 Hypokalemia: Secondary | ICD-10-CM | POA: Diagnosis not present

## 2020-10-18 DIAGNOSIS — F3341 Major depressive disorder, recurrent, in partial remission: Secondary | ICD-10-CM | POA: Diagnosis not present

## 2020-10-18 DIAGNOSIS — R278 Other lack of coordination: Secondary | ICD-10-CM | POA: Diagnosis not present

## 2020-10-18 DIAGNOSIS — M810 Age-related osteoporosis without current pathological fracture: Secondary | ICD-10-CM | POA: Diagnosis not present

## 2020-10-18 DIAGNOSIS — M6389 Disorders of muscle in diseases classified elsewhere, multiple sites: Secondary | ICD-10-CM | POA: Diagnosis not present

## 2020-10-19 DIAGNOSIS — I10 Essential (primary) hypertension: Secondary | ICD-10-CM | POA: Diagnosis not present

## 2020-10-19 DIAGNOSIS — K591 Functional diarrhea: Secondary | ICD-10-CM | POA: Diagnosis not present

## 2020-10-19 DIAGNOSIS — M810 Age-related osteoporosis without current pathological fracture: Secondary | ICD-10-CM | POA: Diagnosis not present

## 2020-10-19 DIAGNOSIS — F3341 Major depressive disorder, recurrent, in partial remission: Secondary | ICD-10-CM | POA: Diagnosis not present

## 2020-10-19 DIAGNOSIS — E876 Hypokalemia: Secondary | ICD-10-CM | POA: Diagnosis not present

## 2020-10-19 DIAGNOSIS — M6389 Disorders of muscle in diseases classified elsewhere, multiple sites: Secondary | ICD-10-CM | POA: Diagnosis not present

## 2020-10-19 DIAGNOSIS — R278 Other lack of coordination: Secondary | ICD-10-CM | POA: Diagnosis not present

## 2020-12-22 ENCOUNTER — Encounter: Payer: Medicare Other | Admitting: Adult Health

## 2020-12-22 DIAGNOSIS — H26492 Other secondary cataract, left eye: Secondary | ICD-10-CM | POA: Diagnosis not present

## 2020-12-22 DIAGNOSIS — H524 Presbyopia: Secondary | ICD-10-CM | POA: Diagnosis not present

## 2020-12-22 DIAGNOSIS — Z961 Presence of intraocular lens: Secondary | ICD-10-CM | POA: Diagnosis not present

## 2020-12-22 DIAGNOSIS — H353131 Nonexudative age-related macular degeneration, bilateral, early dry stage: Secondary | ICD-10-CM | POA: Diagnosis not present

## 2020-12-26 ENCOUNTER — Encounter: Payer: Medicare Other | Admitting: Adult Health

## 2020-12-28 ENCOUNTER — Encounter: Payer: Self-pay | Admitting: Internal Medicine

## 2020-12-29 ENCOUNTER — Other Ambulatory Visit: Payer: Self-pay

## 2020-12-29 ENCOUNTER — Non-Acute Institutional Stay: Payer: Medicare Other | Admitting: Adult Health

## 2020-12-29 ENCOUNTER — Encounter: Payer: Self-pay | Admitting: Adult Health

## 2020-12-29 VITALS — BP 112/70 | HR 61 | Temp 96.8°F | Ht 66.0 in | Wt 163.4 lb

## 2020-12-29 DIAGNOSIS — I10 Essential (primary) hypertension: Secondary | ICD-10-CM | POA: Diagnosis not present

## 2020-12-29 DIAGNOSIS — I872 Venous insufficiency (chronic) (peripheral): Secondary | ICD-10-CM

## 2020-12-29 DIAGNOSIS — K5900 Constipation, unspecified: Secondary | ICD-10-CM

## 2020-12-29 DIAGNOSIS — R635 Abnormal weight gain: Secondary | ICD-10-CM

## 2020-12-29 DIAGNOSIS — E039 Hypothyroidism, unspecified: Secondary | ICD-10-CM | POA: Diagnosis not present

## 2020-12-29 DIAGNOSIS — E7800 Pure hypercholesterolemia, unspecified: Secondary | ICD-10-CM

## 2020-12-29 DIAGNOSIS — R413 Other amnesia: Secondary | ICD-10-CM

## 2020-12-29 DIAGNOSIS — E559 Vitamin D deficiency, unspecified: Secondary | ICD-10-CM | POA: Diagnosis not present

## 2020-12-29 NOTE — Progress Notes (Addendum)
Location: [facility]   POS: clinic  Provider:  Cindi Carbon, [place] [id number]   Code Status: Full code  Goals of Care:  Advanced Directives 12/29/2020  Does Patient Have a Medical Advance Directive? Yes  Type of Advance Directive [place]  Does patient want to make changes to medical advance directive? No - Patient declined  Copy of [place] in Chart? -  Pre-existing out of facility DNR order (yellow form or pink MOST form) -     Chief Complaint  Patient presents with   Medical Management of Chronic Issues    Patient returns to the clinic for follow up.     HPI: Patient is a 87 y.o. female seen today for medical management of chronic diseases.   She is currently residing in assisted living and plans to discharge home in the next two weeks to independent living. She will have home care workers and med management. Her nurse reports she does not require nursing assistance in her AL home and is quite independent.   BP has been well controlled  Weight is trending upward without edema. She reports she is eating too much on AL Wt Readings from Last 3 Encounters:  12/29/20 163 lb 6.4 oz (74.1 kg)  09/23/20 158 lb 6.4 oz (71.8 kg)  09/01/20 156 lb (70.8 kg)   She is trying to stay active by walking her dog  Reports some difficulty remembering things/words  Continues to work on Production designer, theatre/television/film   Past Medical History:  Diagnosis Date   Arthritis    Closed left femoral fracture ([place]) 2012   s/p ORIF, Dr. Tamera Punt   Closed left radial fracture 2012   s/p reduction   Complication of anesthesia 08/14/2016   not able to think clear for a long time afterwards   Essential hypertension    Hyperlipidemia    Hypothyroidism     Past Surgical History:  Procedure Laterality Date   ABDOMINAL HYSTERECTOMY     CATARACT EXTRACTION W/ INTRAOCULAR LENS  IMPLANT, BILATERAL Bilateral 2014   COLONOSCOPY      ORIF FEMUR FRACTURE Left 04/08/2011   Dr. Tamera Punt   TOTAL HIP ARTHROPLASTY Left 08/20/2016   Procedure: TOTAL HIP ARTHROPLASTY POSTERIOR REMOVE [place] NAIL;  Surgeon: Frederik Pear, MD;  Location: [facility];  Service: Orthopedics;  Laterality: Left;    Allergies  Allergen Reactions   Ambien [Zolpidem]    Amoxicillin     UNSPECIFIED REACTION   Has patient had a PCN reaction causing immediate rash, facial/tongue/throat swelling, SOB or lightheadedness with hypotension: Unknown Has patient had a PCN reaction causing severe rash involving mucus membranes or skin necrosis: Unknown Has patient had a PCN reaction that required hospitalization No Has patient had a PCN reaction occurring within the last 10 years: No If all of the above answers are "NO", then may proceed with Cephalosporin use.     Outpatient Encounter Medications as of 12/29/2020  Medication Sig   acetaminophen (TYLENOL) 325 MG tablet Take 650 mg by mouth 3 (three) times daily as needed for mild pain.   Ascorbic Acid (VITAMIN C) 125 MG CHEW Chew 125 mg by mouth daily.   aspirin EC 81 MG tablet Take 81 mg by mouth daily.   Cholecalciferol 50 MCG (2000 UT) CAPS Take 1 capsule (2,000 Units total) by mouth daily.   hydrochlorothiazide (HYDRODIURIL) 25 MG tablet Take 25 mg by mouth daily.   levothyroxine (SYNTHROID) 25 MCG tablet Take 1  tablet (25 mcg total) by mouth daily before breakfast.   loperamide (IMODIUM A-D) 2 MG tablet Take 2 mg by mouth 3 (three) times daily as needed for diarrhea or loose stools.   lovastatin (MEVACOR) 20 MG tablet Take 1 tablet (20 mg total) by mouth at bedtime.   Lutein-Zeaxanthin 25-5 MG CAPS Take 1 capsule by mouth daily at 6 (six) AM.   ondansetron (ZOFRAN) 4 MG tablet Take 4 mg by mouth every 6 (six) hours as needed for nausea or vomiting. (Patient not taking: Reported on 12/29/2020)   polyethylene glycol (MIRALAX / GLYCOLAX) 17 g packet Take 17 g by mouth daily. (Patient not taking: Reported on  12/29/2020)   No facility-administered encounter medications on file as of 12/29/2020.    Review of Systems:  Review of Systems  Constitutional:  Negative for activity change, appetite change, chills, diaphoresis, fatigue, fever and unexpected weight change.  HENT:  Negative for congestion.   Respiratory:  Negative for cough, shortness of breath and wheezing.   Cardiovascular:  Negative for chest pain, palpitations and leg swelling.  Gastrointestinal:  Negative for abdominal distention, abdominal pain, constipation and diarrhea.  Genitourinary:  Negative for difficulty urinating and dysuria.  Musculoskeletal:  Negative for arthralgias, back pain, gait problem (has walker), joint swelling and myalgias.  Neurological:  Negative for dizziness, tremors, seizures, syncope, facial asymmetry, speech difficulty, weakness, light-headedness, numbness and headaches.  Psychiatric/Behavioral:  Negative for agitation, behavioral problems and confusion.        Memory loss   Health Maintenance  Topic Date Due   Zoster Vaccines- Shingrix (1 of 2) Never done   COVID-19 Vaccine (4 - Booster for Moderna series) 08/31/2020   INFLUENZA VACCINE  01/16/2021   TETANUS/TDAP  04/02/2022   DEXA SCAN  Completed   PNA vac Low Risk Adult  Completed   HPV VACCINES  Aged Out    Physical Exam: Vitals:   12/29/20 1404  BP: 112/70  Pulse: 61  Temp: (!) 96.8 F (36 C)  SpO2: 92%  Weight: 163 lb 6.4 oz (74.1 kg)  Height: 5' 6"  (1.676 m)   Body mass index is 26.37 kg/m. Wt Readings from Last 3 Encounters:  12/29/20 163 lb 6.4 oz (74.1 kg)  09/23/20 158 lb 6.4 oz (71.8 kg)  09/01/20 156 lb (70.8 kg)    Physical Exam Vitals and nursing note reviewed.  Constitutional:      General: She is not in acute distress.    Appearance: She is not diaphoretic.  HENT:     Head: Normocephalic and atraumatic.     Right Ear: Tympanic membrane and ear canal normal.     Left Ear: Tympanic membrane and ear canal normal.      Mouth/Throat:     Mouth: Mucous membranes are moist.     Pharynx: Oropharynx is clear.  Eyes:     Conjunctiva/sclera: Conjunctivae normal.     Pupils: Pupils are equal, round, and reactive to light.  Neck:     Vascular: No JVD.  Cardiovascular:     Rate and Rhythm: Normal rate and regular rhythm.     Heart sounds: No murmur heard. Pulmonary:     Effort: Pulmonary effort is normal. No respiratory distress.     Breath sounds: Normal breath sounds. No wheezing.  Abdominal:     General: Bowel sounds are normal. There is no distension.     Palpations: Abdomen is soft.     Tenderness: There is no abdominal tenderness.  Musculoskeletal:  Cervical back: No rigidity or tenderness.     Comments: BLE trace   Lymphadenopathy:     Cervical: No cervical adenopathy.  Skin:    General: Skin is warm and dry.  Neurological:     Mental Status: She is alert and oriented to person, place, and time.  Psychiatric:        Mood and Affect: Mood normal.    Labs reviewed: Basic Metabolic Panel: Recent Labs    02/11/20 0000 06/28/20 0000 06/30/20 0000 07/26/20 1150 07/28/20 0000 08/08/20 0000 08/17/20 0000 10/08/20 0000  NA  --   --    < > 135  --  135* 138 144  K  --   --    < > 3.3*  --  4.4 3.9 3.9  CL  --   --    < > 96  --  97* 99 108  CO2  --   --    < > 28  --  25* 27* 28*  GLUCOSE  --   --   --  110*  --   --   --   --   BUN  --   --    < > 16  --  18 16 21   CREATININE  --   --    < > 0.99  --  1.1 0.8 0.8  CALCIUM  --   --    < > 9.5  --  8.9 9.1 9.4  TSH 4.19 1.80  --   --  2.28  --   --   --    < > = values in this interval not displayed.   Liver Function Tests: Recent Labs    06/30/20 0000 07/26/20 1150 08/08/20 0000  AST 17 15 14   ALT 10 15 9   ALKPHOS  --  169* 152*  BILITOT  --  0.8  --   PROT  --  7.4  --   ALBUMIN  --  3.4*  --    No results for input(s): LIPASE, AMYLASE in the last 8760 hours. No results for input(s): AMMONIA in the last 8760  hours. CBC: Recent Labs    06/30/20 0000 07/26/20 1150 08/08/20 0000  WBC 7.2 11.3* 7.5  NEUTROABS  --  8.1*  --   HGB 12.5 11.8* 10.7*  HCT 37 35.4* 32*  MCV  --  91.0  --   PLT 277 559.0* 586*   Lipid Panel: No results for input(s): CHOL, HDL, LDLCALC, TRIG, CHOLHDL, LDLDIRECT in the last 8760 hours. Lab Results  Component Value Date   HGBA1C 5.7 (H) 04/10/2011    Procedures since last visit: No results found.  Assessment/Plan  1. Acquired hypothyroidism Lab Results  Component Value Date   TSH 2.28 07/28/2020   Continue Synthroid 25 mcg qd   2. Chronic venous insufficiency Continue elevation and compression hose  3. Primary hypertension Controlled Continue hctz 25 mg qd   4. Vitamin D deficiency Continue Vit D 2000 units qd  5. Pure hypercholesterolemia Lab Results  Component Value Date   LDLCALC 108 07/02/2019   Continue mevacor 20 mg qd  6. Constipation, unspecified constipation type Not currently an isssue   7. Memory loss Mild  Ready for [place] with home care support and med management   8. Weight gain Recommend smaller portions Increased activity Will likely lose weight in the home environment     Total time 25:  time greater than 50% of total time  spent doing pt counseling and coordination of care   Updated shingrix vaccine info in epic  Recommend covid 4th shot    CBC BMP lipid panel Monday 7/18   Next appt:  02/15/2021

## 2021-01-02 DIAGNOSIS — I10 Essential (primary) hypertension: Secondary | ICD-10-CM | POA: Diagnosis not present

## 2021-01-02 DIAGNOSIS — D638 Anemia in other chronic diseases classified elsewhere: Secondary | ICD-10-CM | POA: Diagnosis not present

## 2021-01-02 LAB — LIPID PANEL
Cholesterol: 180 (ref 0–200)
HDL: 72 — AB (ref 35–70)
LDL Cholesterol: 94
LDl/HDL Ratio: 2.5
Triglycerides: 72 (ref 40–160)

## 2021-01-02 LAB — BASIC METABOLIC PANEL
BUN: 20 (ref 4–21)
CO2: 26 — AB (ref 13–22)
Chloride: 104 (ref 99–108)
Creatinine: 0.8 (ref 0.5–1.1)
Glucose: 87
Potassium: 4 (ref 3.4–5.3)
Sodium: 141 (ref 137–147)

## 2021-01-02 LAB — COMPREHENSIVE METABOLIC PANEL: Calcium: 9.3 (ref 8.7–10.7)

## 2021-01-02 LAB — CBC AND DIFFERENTIAL
HCT: 36 (ref 36–46)
Hemoglobin: 12.5 (ref 12.0–16.0)
Platelets: 217 (ref 150–399)
WBC: 4.7

## 2021-01-02 LAB — CBC: RBC: 4.1 (ref 3.87–5.11)

## 2021-02-09 ENCOUNTER — Other Ambulatory Visit: Payer: Self-pay | Admitting: Adult Health

## 2021-02-09 DIAGNOSIS — U071 COVID-19: Secondary | ICD-10-CM

## 2021-02-09 MED ORDER — PAXLOVID (150/100) 10 X 150 MG & 10 X 100MG PO TBPK: 2.0000 | ORAL_TABLET | Freq: Two times a day (BID) | ORAL | 0 refills | Status: DC

## 2021-02-09 NOTE — Progress Notes (Signed)
Nurse Nira Conn from [facility] called to report that Amber Bryant has tested positive for covid with symptoms of fatigue and congestion 02/09/2021 Paxlovid prescribed. Hold mevacor while on paxlovid.

## 2021-02-15 ENCOUNTER — Other Ambulatory Visit: Payer: Self-pay

## 2021-02-15 ENCOUNTER — Non-Acute Institutional Stay: Payer: Medicare Other | Admitting: Internal Medicine

## 2021-02-15 ENCOUNTER — Encounter: Payer: Self-pay | Admitting: Internal Medicine

## 2021-02-15 VITALS — BP 108/70 | HR 68 | Temp 96.7°F | Ht 66.0 in | Wt 157.4 lb

## 2021-02-15 DIAGNOSIS — U071 COVID-19: Secondary | ICD-10-CM | POA: Diagnosis not present

## 2021-02-15 DIAGNOSIS — K5792 Diverticulitis of intestine, part unspecified, without perforation or abscess without bleeding: Secondary | ICD-10-CM

## 2021-02-15 DIAGNOSIS — E7800 Pure hypercholesterolemia, unspecified: Secondary | ICD-10-CM

## 2021-02-15 DIAGNOSIS — E039 Hypothyroidism, unspecified: Secondary | ICD-10-CM | POA: Diagnosis not present

## 2021-02-15 DIAGNOSIS — I872 Venous insufficiency (chronic) (peripheral): Secondary | ICD-10-CM | POA: Diagnosis not present

## 2021-02-15 DIAGNOSIS — G3184 Mild cognitive impairment, so stated: Secondary | ICD-10-CM

## 2021-02-15 DIAGNOSIS — I10 Essential (primary) hypertension: Secondary | ICD-10-CM

## 2021-02-20 NOTE — Progress Notes (Signed)
Location:  [facility] of Service:  Clinic (12)  Provider:   Code Status:  Goals of Care:  Advanced Directives 02/15/2021  Does Patient Have a Medical Advance Directive? Yes  Type of Advance Directive [place]  Does patient want to make changes to medical advance directive? No - Patient declined  Copy of [place] in Chart? -  Pre-existing out of facility DNR order (yellow form or pink MOST form) -     Chief Complaint  Patient presents with   Medical Management of Chronic Issues    Patient returns to the clinic to meet Dr. Lyndel Safe.    Quality Metric Gaps    #4 Covid and flu shot    HPI: Patient is a 87 y.o. female seen today for medical management of chronic diseases.   Sigmoid diverticulitis diagnosed in 2/22 when she presented with a month history of abdominal cramps diarrhea and rectal bleeding.  Was treated with 2 weeks of Cipro and Flagyl. Patient states all her symptoms have now resolved she has started eating back Recent COVID infection Was treated with Paxlovid Her symptoms are now resolved. Has noticed some dizziness with change of position especially when she stands up.  Also has a history of hyperlipidemia, hypertension, chronic lower extremity edema, hypothyroid. She was in assisted living.  But [place].  Is working on a book.  Does have cognitive impairment and last MMSE was 23 out of 30.  But is managing well with help of her daughter.    Past Medical History:  Diagnosis Date   Arthritis    Closed left femoral fracture ([place]) 2012   s/p ORIF, Dr. Tamera Punt   Closed left radial fracture 2012   s/p reduction   Complication of anesthesia 08/14/2016   not able to think clear for a long time afterwards   Essential hypertension    Hyperlipidemia    Hypothyroidism     Past Surgical History:  Procedure Laterality Date   ABDOMINAL HYSTERECTOMY      CATARACT EXTRACTION W/ INTRAOCULAR LENS  IMPLANT, BILATERAL Bilateral 2014   COLONOSCOPY     ORIF FEMUR FRACTURE Left 04/08/2011   Dr. Tamera Punt   TOTAL HIP ARTHROPLASTY Left 08/20/2016   Procedure: TOTAL HIP ARTHROPLASTY POSTERIOR REMOVE TROCH NAIL;  Surgeon: Frederik Pear, MD;  Location: [facility];  Service: Orthopedics;  Laterality: Left;    Allergies  Allergen Reactions   Ambien [Zolpidem]    Amoxicillin     UNSPECIFIED REACTION   Has patient had a PCN reaction causing immediate rash, facial/tongue/throat swelling, SOB or lightheadedness with hypotension: Unknown Has patient had a PCN reaction causing severe rash involving mucus membranes or skin necrosis: Unknown Has patient had a PCN reaction that required hospitalization No Has patient had a PCN reaction occurring within the last 10 years: No If all of the above answers are "NO", then may proceed with Cephalosporin use.     Outpatient Encounter Medications as of 02/15/2021  Medication Sig   acetaminophen (TYLENOL) 325 MG tablet Take 650 mg by mouth 3 (three) times daily as needed for mild pain.   Cholecalciferol 50 MCG (2000 UT) CAPS Take 1 capsule (2,000 Units total) by mouth daily.   hydrochlorothiazide (HYDRODIURIL) 25 MG tablet Take 12.5 mg by mouth daily.   levothyroxine (SYNTHROID) 25 MCG tablet Take 1 tablet (25 mcg total) by mouth daily before breakfast.   lovastatin (MEVACOR) 20  MG tablet Take 20 mg by mouth at bedtime.   Lutein-Zeaxanthin 25-5 MG CAPS Take 1 capsule by mouth daily at 6 (six) AM.   polyethylene glycol (MIRALAX / GLYCOLAX) 17 g packet Take 17 g by mouth daily as needed.   [DISCONTINUED] aspirin EC 81 MG tablet Take 81 mg by mouth daily.   [DISCONTINUED] loperamide (IMODIUM A-D) 2 MG tablet Take 2 mg by mouth 3 (three) times daily as needed for diarrhea or loose stools.   [DISCONTINUED] ondansetron (ZOFRAN) 4 MG tablet Take 4 mg by mouth every 6 (six) hours as needed for nausea or vomiting.   [DISCONTINUED]  Ascorbic Acid (VITAMIN C) 125 MG CHEW Chew 125 mg by mouth daily.   [DISCONTINUED] nirmatrelvir & ritonavir (PAXLOVID, 150/100,) 10 x 150 MG & 10 x 100MG TBPK Take 2 tablets by mouth in the morning and at bedtime. GFR 50 Hold lovastatin while on Paxlovid   No facility-administered encounter medications on file as of 02/15/2021.    Review of Systems:  Review of Systems Review of Systems  Constitutional: Negative for activity change, appetite change, chills, diaphoresis, fatigue and fever.  HENT: Negative for mouth sores, postnasal drip, rhinorrhea, sinus pain and sore throat.   Respiratory: Negative for apnea, cough, chest tightness, shortness of breath and wheezing.   Cardiovascular: Negative for chest pain, palpitations and leg swelling.  Gastrointestinal: Negative for abdominal distention, abdominal pain, constipation, diarrhea, nausea and vomiting.  Genitourinary: Negative for dysuria and frequency.  Musculoskeletal: Negative for arthralgias, joint swelling and myalgias.  Skin: Negative for rash.  Neurological: Negative for  syncope, weakness, light-headedness and numbness.  Psychiatric/Behavioral: Negative for behavioral problems, confusion and sleep disturbance.    Health Maintenance  Topic Date Due   COVID-19 Vaccine (4 - Booster for Moderna series) 08/31/2020   INFLUENZA VACCINE  01/16/2021   TETANUS/TDAP  04/02/2022   DEXA SCAN  Completed   PNA vac Low Risk Adult  Completed   Zoster Vaccines- Shingrix  Completed   HPV VACCINES  Aged Out    Physical Exam: Vitals:   02/15/21 1047  BP: 108/70  Pulse: 68  Temp: (!) 96.7 F (35.9 C)  SpO2: 98%  Weight: 157 lb 6.4 oz (71.4 kg)  Height: 5' 6"  (1.676 m)   Body mass index is 25.41 kg/m. Physical Exam Constitutional: Oriented to person, place, and time. Well-developed and well-nourished.  HENT:  Head: Normocephalic.  Mouth/Throat: Oropharynx is clear and moist.  Eyes: Pupils are equal, round, and reactive to light.   Neck: Neck supple.  Cardiovascular: Normal rate and normal heart sounds.  No murmur heard. Pulmonary/Chest: Effort normal and breath sounds normal. No respiratory distress. No wheezes. She has no rales.  Abdominal: Soft. Bowel sounds are normal. No distension. There is no tenderness. There is no rebound.  Musculoskeletal: Chronic Venous changes in her Legs Lymphadenopathy: none Neurological: Alert and oriented to person, place, and time.  C/o Feeling dizzy when tries to stand up  Skin: Skin is warm and dry.  Psychiatric: Normal mood and affect. Behavior is normal. Thought content normal.   Labs reviewed: Basic Metabolic Panel: Recent Labs    06/28/20 0000 06/30/20 0000 07/26/20 1150 07/28/20 0000 08/08/20 0000 08/17/20 0000 10/08/20 0000 01/02/21 0000  NA  --    < > 135  --    < > 138 144 141  K  --    < > 3.3*  --    < > 3.9 3.9 4.0  CL  --    < >  96  --    < > 99 108 104  CO2  --    < > 28  --    < > 27* 28* 26*  GLUCOSE  --   --  110*  --   --   --   --   --   BUN  --    < > 16  --    < > 16 21 20   CREATININE  --    < > 0.99  --    < > 0.8 0.8 0.8  CALCIUM  --    < > 9.5  --    < > 9.1 9.4 9.3  TSH 1.80  --   --  2.28  --   --   --   --    < > = values in this interval not displayed.   Liver Function Tests: Recent Labs    06/30/20 0000 07/26/20 1150 08/08/20 0000  AST 17 15 14   ALT 10 15 9   ALKPHOS  --  169* 152*  BILITOT  --  0.8  --   PROT  --  7.4  --   ALBUMIN  --  3.4*  --    No results for input(s): LIPASE, AMYLASE in the last 8760 hours. No results for input(s): AMMONIA in the last 8760 hours. CBC: Recent Labs    07/26/20 1150 08/08/20 0000 01/02/21 0000  WBC 11.3* 7.5 4.7  NEUTROABS 8.1*  --   --   HGB 11.8* 10.7* 12.5  HCT 35.4* 32* 36  MCV 91.0  --   --   PLT 559.0* 586* 217   Lipid Panel: Recent Labs    01/02/21 0000  CHOL 180  HDL 72*  LDLCALC 94  TRIG 72   Lab Results  Component Value Date   HGBA1C 5.7 (H) 04/10/2011     Procedures since last visit: No results found.  Assessment/Plan COVID-19 virus infection Has ReCovered No Symptoms  Acquired hypothyroidism TSH normal in 2/22  Chronic venous insufficiency Conservative management Primary hypertension BP runing on lower side and her c/o Dizziness will  reduce te HCTZ to 12.5 mg Pure hypercholesterolemia On Statin LDL good range Diverticulitis Symptoms resolved for now Hgb normal MCI (mild cognitive impairment) Meds Managed by Nurses Doing well with her ADLS Will discontinue Aspirin, Zofran and Imodium Does not need it   Labs/tests ordered:  * No order type specified * Next appt:  05/15/2021

## 2021-02-21 ENCOUNTER — Other Ambulatory Visit: Payer: Self-pay | Admitting: Internal Medicine

## 2021-02-21 DIAGNOSIS — E039 Hypothyroidism, unspecified: Secondary | ICD-10-CM

## 2021-03-13 ENCOUNTER — Other Ambulatory Visit: Payer: Self-pay | Admitting: Adult Health

## 2021-03-13 MED ORDER — CHOLECALCIFEROL 50 MCG (2000 UT) PO CAPS: 2000.0000 [IU] | ORAL_CAPSULE | Freq: Every day | ORAL | 3 refills | Status: DC

## 2021-03-15 ENCOUNTER — Other Ambulatory Visit: Payer: Self-pay | Admitting: Internal Medicine

## 2021-03-15 NOTE — Telephone Encounter (Signed)
Patient has request refill on medication "Hydrochlorothiazide". Patient medication list has Hydrochlorothiazide 12m, but pharmacy request sent over Hydrochlorothiazide 12.534m Medications pend sent to [facility] HawthornNP to determine what prescription needs to be sent in. Please Advise.

## 2021-03-26 ENCOUNTER — Emergency Department (HOSPITAL_BASED_OUTPATIENT_CLINIC_OR_DEPARTMENT_OTHER)
Admission: EM | Admit: 2021-03-26 | Discharge: 2021-03-26 | Disposition: A | Discharge status: Home / Self Care | Payer: Medicare Other | Attending: Student | Admitting: Student

## 2021-03-26 ENCOUNTER — Other Ambulatory Visit: Payer: Self-pay

## 2021-03-26 ENCOUNTER — Encounter (HOSPITAL_BASED_OUTPATIENT_CLINIC_OR_DEPARTMENT_OTHER): Payer: Self-pay | Admitting: *Deleted

## 2021-03-26 DIAGNOSIS — Z96642 Presence of left artificial hip joint: Secondary | ICD-10-CM | POA: Insufficient documentation

## 2021-03-26 DIAGNOSIS — E039 Hypothyroidism, unspecified: Secondary | ICD-10-CM | POA: Diagnosis not present

## 2021-03-26 DIAGNOSIS — H11431 Conjunctival hyperemia, right eye: Secondary | ICD-10-CM | POA: Diagnosis not present

## 2021-03-26 DIAGNOSIS — Z79899 Other long term (current) drug therapy: Secondary | ICD-10-CM | POA: Diagnosis not present

## 2021-03-26 DIAGNOSIS — B029 Zoster without complications: Secondary | ICD-10-CM | POA: Insufficient documentation

## 2021-03-26 DIAGNOSIS — I10 Essential (primary) hypertension: Secondary | ICD-10-CM | POA: Insufficient documentation

## 2021-03-26 DIAGNOSIS — H9209 Otalgia, unspecified ear: Secondary | ICD-10-CM | POA: Insufficient documentation

## 2021-03-26 DIAGNOSIS — R21 Rash and other nonspecific skin eruption: Secondary | ICD-10-CM | POA: Diagnosis present

## 2021-03-26 MED ORDER — HYDROCODONE-ACETAMINOPHEN 5-325 MG PO TABS: 2.0000 | ORAL_TABLET | ORAL | 0 refills | Status: DC | PRN

## 2021-03-26 MED ORDER — VALACYCLOVIR HCL 1 G PO TABS: 1000.0000 mg | ORAL_TABLET | Freq: Three times a day (TID) | ORAL | 0 refills | Status: DC

## 2021-03-26 MED ORDER — TETRACAINE HCL 0.5 % OP SOLN
1.0000 [drp] | Freq: Once | OPHTHALMIC | Status: AC
  Administered 2021-03-26: 1 [drp] via OPHTHALMIC
  Filled 2021-03-26: qty 4

## 2021-03-26 MED ORDER — FLUORESCEIN SODIUM 1 MG OP STRP
1.0000 | ORAL_STRIP | Freq: Once | OPHTHALMIC | Status: AC
  Administered 2021-03-26: 1 via OPHTHALMIC
  Filled 2021-03-26: qty 1

## 2021-03-26 NOTE — ED Provider Notes (Addendum)
[facility] EMERGENCY DEPT Provider Note   CSN: [id number] Arrival date & time: 03/26/21  1042     History Chief Complaint  Patient presents with   Facial Swelling   Herpes Zoster    Amber Bryant is a 87 y.o. female who presents the emergency department for evaluation of a facial rash.  Patient has been evaluated by dentistry recently and was recently prescribed azithromycin.  She has taken 4 of these doses and broken out into a facial rash with associated swelling and redness.  She also endorses eye pain and ear pain.  She denies wheezing, chest pain, shortness of breath, vomiting, pruritus or other systemic or anaphylactic symptoms.   Allergic Reaction Presenting symptoms: rash       Past Medical History:  Diagnosis Date   Arthritis    Closed left femoral fracture ([place]) 2012   s/p ORIF, Dr. 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 Tamera Punt   TOTAL HIP ARTHROPLASTY Left 08/20/2016   Procedure: TOTAL HIP ARTHROPLASTY POSTERIOR REMOVE TROCH NAIL;  Surgeon: Frederik Pear, MD;  Location: [facility];  Service: Orthopedics;  Laterality: Left;     OB History   No obstetric history on file.     Family History  Problem Relation Age of Onset   Colon cancer Mother    Heart disease Father    Esophageal cancer Neg Hx    Stomach cancer Neg Hx    Pancreatic cancer Neg Hx     Social History   Tobacco Use   Smoking status: Never   Smokeless tobacco: Never  Vaping Use   Vaping Use: Never used  Substance Use Topics   Alcohol use: No   Drug use: No    Home Medications Prior to Admission medications   Medication Sig Start Date End Date Taking? Authorizing Provider  HYDROcodone-acetaminophen (NORCO/VICODIN) 5-325 MG tablet Take 2 tablets by mouth every 4 (four) hours as needed for severe pain. 03/26/21  Yes Cassian Torelli, MD  valACYclovir (VALTREX) 1000 MG tablet Take 1 tablet (1,000 mg total) by mouth 3 (three) times daily. 03/26/21  Yes Anaiya Wisinski, MD  acetaminophen (TYLENOL) 325 MG tablet Take 650 mg by mouth 3 (three) times daily as needed for mild pain.    [provider]  Cholecalciferol 50 MCG (2000 UT) CAPS Take 1 capsule (2,000 Units total) by mouth daily. 03/13/21   Royal Hawthorn, NP  hydrochlorothiazide (HYDRODIURIL) 12.5 MG tablet TAKE 1 TABLET BY MOUTH ONCE DAILY 03/16/21   Royal Hawthorn, NP  hydrochlorothiazide (HYDRODIURIL) 25  MG tablet Take 12.5 mg by mouth daily.    [provider]  levothyroxine (SYNTHROID) 25 MCG tablet TAKE 1 TABLET BY MOUTH EVERY MORNING 02/21/21   Virgie Dad, MD  lovastatin (MEVACOR) 20 MG tablet TAKE 1 TABLET BY MOUTH EVERY MORNING 02/21/21   Virgie Dad, MD  Lutein-Zeaxanthin 25-5 MG CAPS Take 1 capsule by mouth daily at 6 (six) AM.    [provider]  polyethylene glycol (MIRALAX / GLYCOLAX) 17 g packet Take 17 g by mouth daily as needed.    [provider]  vitamin C (ASCORBIC ACID) 250 MG tablet TAKE 1/2 TABLET (125MG) BY MOUTH EVERY MORNING 02/21/21   Virgie Dad,  MD    Allergies    Ambien [zolpidem], Amoxicillin, and Shellfish allergy  Review of Systems   Review of Systems  Constitutional:  Negative for chills and fever.  HENT:  Positive for facial swelling. Negative for ear pain and sore throat.   Eyes:  Negative for pain and visual disturbance.  Respiratory:  Negative for cough and shortness of breath.   Cardiovascular:  Negative for chest pain and palpitations.  Gastrointestinal:  Negative for abdominal pain and vomiting.  Genitourinary:  Negative for dysuria and hematuria.  Musculoskeletal:  Negative for arthralgias and back pain.  Skin:  Positive for rash. Negative for color change.  Neurological:  Negative for seizures and syncope.  All other systems reviewed and are negative.  Physical Exam Updated Vital Signs BP (!) 124/97 (BP Location: Right Arm)   Pulse 75   Temp 98 F (36.7 C) (Oral)   Resp 16   Ht 5' 7"  (1.702 m)   Wt 70.8 kg   SpO2 96%   BMI 24.43 kg/m   Physical Exam Vitals and nursing note reviewed.  Constitutional:      General: She is not in acute distress.    Appearance: She is well-developed.  HENT:     Head: Normocephalic and atraumatic.     Comments: Vesicular rash present in the V2 distribution on the right conjunctival injection, no lesions in the ear Eyes:     Conjunctiva/sclera: Conjunctivae normal.  Cardiovascular:     Rate and Rhythm: Normal rate and regular rhythm.     Heart sounds: No murmur heard. Pulmonary:     Effort: Pulmonary effort is normal. No respiratory distress.     Breath sounds: Normal breath sounds.  Abdominal:     Palpations: Abdomen is soft.     Tenderness: There is no abdominal tenderness.  Musculoskeletal:     Cervical back: Neck supple.  Skin:    General: Skin is warm and dry.  Neurological:     Mental Status: She is alert.       ED Results / Procedures / Treatments   Labs (all labs ordered are listed, but only abnormal results are displayed) Labs Reviewed - No  data to display  EKG None  Radiology No results found.  Procedures Procedures   Medications Ordered in ED Medications  fluorescein ophthalmic strip 1 strip (1 strip Both Eyes Given by Other 03/26/21 1153)  tetracaine (PONTOCAINE) 0.5 % ophthalmic solution 1 drop (1 drop Right Eye Given by Other 03/26/21 1154)    ED Course  I have reviewed the triage vital signs and the nursing notes.  Pertinent labs & imaging results that were available during my care of the patient were reviewed by me and considered in my medical decision making (see chart for details).  MDM Rules/Calculators/A&P                           Patient seen emergency department for evaluation of a facial rash.  Physical exam reveals a vesicular rash in the V2 distribution that does not cross midline with associated conjunctival injection.  Patient presentation consistent with shingles and a fluorescein exam was performed to rule out herpes ophthalmicus.  Fluorescein exam reveals no dendritic lesions.  Patient will require a weeklong course of Valtrex and she was discharged with this medication as well as Norco for pain.  I called and left a message for the patient's ophthalmologist to call the patient for ocular evaluation tomorrow.  Patient then discharged. Final Clinical Impression(s) / ED Diagnoses Final diagnoses:  Herpes zoster without complication    Rx / DC Orders ED Discharge Orders          Ordered    HYDROcodone-acetaminophen (NORCO/VICODIN) 5-325 MG tablet  Every 4 hours PRN        03/26/21 1149    valACYclovir (VALTREX) 1000 MG tablet  3 times daily        03/26/21 1149             Halsey Persaud, [place], MD 03/26/21 1528    Teressa Lower, MD 03/26/21 1528

## 2021-03-26 NOTE — ED Triage Notes (Signed)
Pt has taken 4 doses of Azithromycin, last 2 day facial swelling and redness. No airway involvement.

## 2021-03-27 DIAGNOSIS — B0052 Herpesviral keratitis: Secondary | ICD-10-CM | POA: Diagnosis not present

## 2021-04-04 DIAGNOSIS — B0052 Herpesviral keratitis: Secondary | ICD-10-CM | POA: Diagnosis not present

## 2021-04-26 ENCOUNTER — Other Ambulatory Visit (HOSPITAL_BASED_OUTPATIENT_CLINIC_OR_DEPARTMENT_OTHER): Payer: Self-pay

## 2021-04-27 DIAGNOSIS — Z23 Encounter for immunization: Secondary | ICD-10-CM | POA: Diagnosis not present

## 2021-05-15 ENCOUNTER — Other Ambulatory Visit: Payer: Self-pay

## 2021-05-15 ENCOUNTER — Encounter: Payer: Medicare Other | Admitting: Adult Health

## 2021-05-31 ENCOUNTER — Telehealth: Payer: Self-pay

## 2021-05-31 NOTE — Telephone Encounter (Addendum)
Message was sent to me via a staff message:    Virgie Dad, MD  Logan Bores, CMA [facility]  She needs clearance for something. Dont know the details.  DON and Nurse keeps asking me that I need to see her .  Thanks  Anjali    I called patient per message above and patient states she has an appointment with Dr.Gupta on 06/28/2021 and she has no idea about a clearance of any sort. Patient was offered to be seen sooner in office and states that is not necessary. Patient aware she can call to be seen sooner by a [facility] in office provider if needed. Patient was grateful that we called.

## 2021-06-20 DIAGNOSIS — H353131 Nonexudative age-related macular degeneration, bilateral, early dry stage: Secondary | ICD-10-CM | POA: Diagnosis not present

## 2021-06-27 ENCOUNTER — Other Ambulatory Visit (HOSPITAL_BASED_OUTPATIENT_CLINIC_OR_DEPARTMENT_OTHER): Payer: Self-pay

## 2021-06-28 ENCOUNTER — Other Ambulatory Visit: Payer: Self-pay

## 2021-06-28 ENCOUNTER — Telehealth: Payer: Self-pay | Admitting: *Deleted

## 2021-06-28 ENCOUNTER — Non-Acute Institutional Stay: Payer: Medicare Other | Admitting: Internal Medicine

## 2021-06-28 ENCOUNTER — Encounter: Payer: Self-pay | Admitting: Internal Medicine

## 2021-06-28 VITALS — BP 154/86 | HR 76 | Temp 97.3°F | Ht 67.0 in | Wt 166.8 lb

## 2021-06-28 DIAGNOSIS — G3184 Mild cognitive impairment, so stated: Secondary | ICD-10-CM | POA: Diagnosis not present

## 2021-06-28 DIAGNOSIS — I10 Essential (primary) hypertension: Secondary | ICD-10-CM

## 2021-06-28 DIAGNOSIS — I872 Venous insufficiency (chronic) (peripheral): Secondary | ICD-10-CM

## 2021-06-28 DIAGNOSIS — E039 Hypothyroidism, unspecified: Secondary | ICD-10-CM | POA: Diagnosis not present

## 2021-06-28 DIAGNOSIS — E7800 Pure hypercholesterolemia, unspecified: Secondary | ICD-10-CM

## 2021-06-28 NOTE — Telephone Encounter (Signed)
[place] notified and agreed.

## 2021-06-28 NOTE — Telephone Encounter (Signed)
Yes she can be seen tomorrow. She does not have any active infection

## 2021-06-28 NOTE — Progress Notes (Signed)
Location:  [facility] of Service:  Clinic (12)  Provider:   Code Status:  Goals of Care:  Advanced Directives 03/26/2021  Does Patient Have a Medical Advance Directive? Yes  Type of Paramedic of [place];Living will  Does patient want to make changes to medical advance directive? -  Copy of [place] in Chart? -  Pre-existing out of facility DNR order (yellow form or pink MOST form) -     Chief Complaint  Patient presents with   Medical Management of Chronic Issues    Patient returns to the clinic. She states her concerns are confidential.      HPI: Patient is a 88 y.o. female seen today for medical management of chronic diseases.    Herpes Zoster Facial  break out on 10/22 with Facial swelling Treated with Valtrex per ED  She is having some work done by [employer] and oral surgeons and they wanted her to get clearance tha there Herpes is now non Contagious She also was seen by ophthalmologist and was discharged after 2 weeks Patient states that her skin feels little sensitive around that area.  But she does not have any open vesicles  Sigmoid diverticulitis diagnosed in 2/22  history of hyperlipidemia, hypertension, chronic lower extremity edema, hypothyroid.  She continues to do well in her apartment.  Walks with her dog.  Is working on her book Does not drive anymore.  Uses her walker.  Past Medical History:  Diagnosis Date   Arthritis    Closed left femoral fracture ([place]) 2012   s/p ORIF, Dr. Tamera Punt   Closed left radial fracture 2012   s/p reduction   Complication of anesthesia 08/14/2016   not able to think clear for a long time afterwards   Essential hypertension    Hyperlipidemia    Hypothyroidism     Past Surgical History:  Procedure Laterality Date   ABDOMINAL HYSTERECTOMY     CATARACT EXTRACTION W/ INTRAOCULAR LENS  IMPLANT, BILATERAL Bilateral 2014   COLONOSCOPY     ORIF FEMUR  FRACTURE Left 04/08/2011   Dr. Tamera Punt   TOTAL HIP ARTHROPLASTY Left 08/20/2016   Procedure: TOTAL HIP ARTHROPLASTY POSTERIOR REMOVE TROCH NAIL;  Surgeon: Frederik Pear, MD;  Location: [facility];  Service: Orthopedics;  Laterality: Left;    Allergies  Allergen Reactions   Ambien [Zolpidem]    Amoxicillin     UNSPECIFIED REACTION   Has patient had a PCN reaction causing immediate rash, facial/tongue/throat swelling, SOB or lightheadedness with hypotension: Unknown Has patient had a PCN reaction causing severe rash involving mucus membranes or skin necrosis: Unknown Has patient had a PCN reaction that required hospitalization No Has patient had a PCN reaction occurring within the last 10 years: No If all of the above answers are "NO", then may proceed with Cephalosporin use.    Shellfish Allergy     Outpatient Encounter Medications as of 06/28/2021  Medication Sig   acetaminophen (TYLENOL) 325 MG tablet Take 650 mg by mouth 3 (three) times daily as needed for mild pain.   azithromycin (ZITHROMAX) 500 MG tablet Take 1,000 mg by mouth daily. Before dental visits   Cholecalciferol 50 MCG (2000 UT) CAPS Take 1 capsule (2,000 Units total) by mouth daily.   hydrochlorothiazide (HYDRODIURIL) 25 MG tablet Take 12.5 mg by mouth daily.   levothyroxine (SYNTHROID) 25 MCG tablet TAKE 1 TABLET BY MOUTH EVERY MORNING   lovastatin (MEVACOR) 20 MG tablet TAKE 1  TABLET BY MOUTH EVERY MORNING   Lutein-Zeaxanthin 25-5 MG CAPS Take 1 capsule by mouth daily at 6 (six) AM.   polyethylene glycol (MIRALAX / GLYCOLAX) 17 g packet Take 17 g by mouth daily as needed.   [DISCONTINUED] hydrochlorothiazide (HYDRODIURIL) 12.5 MG tablet TAKE 1 TABLET BY MOUTH ONCE DAILY   [DISCONTINUED] HYDROcodone-acetaminophen (NORCO/VICODIN) 5-325 MG tablet Take 2 tablets by mouth every 4 (four) hours as needed for severe pain.   [DISCONTINUED] valACYclovir (VALTREX) 1000 MG tablet Take 1 tablet (1,000 mg total) by mouth 3 (three) times  daily.   [DISCONTINUED] vitamin C (ASCORBIC ACID) 250 MG tablet TAKE 1/2 TABLET (125MG ) BY MOUTH EVERY MORNING   No facility-administered encounter medications on file as of 06/28/2021.    Review of Systems:  Review of Systems  Constitutional:  Negative for activity change and appetite change.  HENT: Negative.    Respiratory:  Negative for cough and shortness of breath.   Cardiovascular:  Negative for leg swelling.  Gastrointestinal:  Negative for constipation.  Genitourinary: Negative.   Musculoskeletal:  Positive for gait problem. Negative for arthralgias and myalgias.  Skin: Negative.   Neurological:  Negative for dizziness and weakness.  Psychiatric/Behavioral:  Negative for confusion, dysphoric mood and sleep disturbance.    Health Maintenance  Topic Date Due   Pneumonia Vaccine 35+ Years old (1 - PCV) Never done   COVID-19 Vaccine (4 - Booster for Moderna series) 06/28/2020   INFLUENZA VACCINE  01/16/2021   TETANUS/TDAP  04/02/2022   DEXA SCAN  Completed   Zoster Vaccines- Shingrix  Completed   HPV VACCINES  Aged Out    Physical Exam: Vitals:   06/28/21 1045  BP: (!) 154/86  Pulse: 76  Temp: (!) 97.3 F (36.3 C)  SpO2: 99%  Weight: 166 lb 12.8 oz (75.7 kg)  Height: 5\' 7"  (1.702 m)   Body mass index is 26.12 kg/m. Physical Exam Vitals reviewed.  Constitutional:      Appearance: Normal appearance.  HENT:     Head: Normocephalic.     Nose: Nose normal.     Mouth/Throat:     Mouth: Mucous membranes are moist.     Pharynx: Oropharynx is clear.  Eyes:     Pupils: Pupils are equal, round, and reactive to light.  Cardiovascular:     Rate and Rhythm: Normal rate and regular rhythm.     Pulses: Normal pulses.     Heart sounds: Normal heart sounds. No murmur heard. Pulmonary:     Effort: Pulmonary effort is normal.     Breath sounds: Normal breath sounds.  Abdominal:     General: Abdomen is flat. Bowel sounds are normal.     Palpations: Abdomen is soft.   Musculoskeletal:        General: No swelling.     Cervical back: Neck supple.  Skin:    General: Skin is warm.     Comments: No Rash or any active vesicles  Neurological:     General: No focal deficit present.     Mental Status: She is alert and oriented to person, place, and time.  Psychiatric:        Mood and Affect: Mood normal.        Thought Content: Thought content normal.    Labs reviewed: Basic Metabolic Panel: Recent Labs    07/26/20 1150 07/28/20 0000 08/08/20 0000 08/17/20 0000 10/08/20 0000 01/02/21 0000  NA 135  --    < > 138 144 141  K  3.3*  --    < > 3.9 3.9 4.0  CL 96  --    < > 99 108 104  CO2 28  --    < > 27* 28* 26*  GLUCOSE 110*  --   --   --   --   --   BUN 16  --    < > 16 21 20   CREATININE 0.99  --    < > 0.8 0.8 0.8  CALCIUM 9.5  --    < > 9.1 9.4 9.3  TSH  --  2.28  --   --   --   --    < > = values in this interval not displayed.   Liver Function Tests: Recent Labs    06/30/20 0000 07/26/20 1150 08/08/20 0000  AST 17 15 14   ALT 10 15 9   ALKPHOS  --  169* 152*  BILITOT  --  0.8  --   PROT  --  7.4  --   ALBUMIN  --  3.4*  --    No results for input(s): LIPASE, AMYLASE in the last 8760 hours. No results for input(s): AMMONIA in the last 8760 hours. CBC: Recent Labs    07/26/20 1150 08/08/20 0000 01/02/21 0000  WBC 11.3* 7.5 4.7  NEUTROABS 8.1*  --   --   HGB 11.8* 10.7* 12.5  HCT 35.4* 32* 36  MCV 91.0  --   --   PLT 559.0* 586* 217   Lipid Panel: Recent Labs    01/02/21 0000  CHOL 180  HDL 72*  LDLCALC 94  TRIG 72   Lab Results  Component Value Date   HGBA1C 5.7 (H) 04/10/2011    Procedures since last visit: No results found.  Assessment/Plan 1. Primary hypertension Slighly High  I had reduced her HCTZ last visit Will continue to monitor Passive Control  2. Acquired hypothyroidism Repeat TSH  3. Pure hypercholesterolemia On Statin Repeat Lipid  MCI (mild cognitive impairment) Meds Managed by  Nurses Doing well with her ADLS last MMSE was 23 out of 30  Cleared to have her Tooth Extraction Labs/tests ordered:  CBC,CMP,TSH,Lipid before next visit Next appt:  10/23/2021

## 2021-06-28 NOTE — Telephone Encounter (Signed)
Amber Bryant with Dr. Bing Plume office called and stated that patient had shingles the end of last year 2022 and just had sensitivity to her Right cheek and under eye.   Patient is coming into the office tomorrow for fillings with Dr. Otila Kluver (Dentist) and they want to make sure patient is ok to do the fillings.   Patient stated that she is fine but they wanted to check with her Provider.   Please Advise.

## 2021-07-10 ENCOUNTER — Other Ambulatory Visit: Payer: Self-pay | Admitting: *Deleted

## 2021-07-10 MED ORDER — CHOLECALCIFEROL 50 MCG (2000 UT) PO CAPS: 2000.0000 [IU] | ORAL_CAPSULE | Freq: Every day | ORAL | 3 refills | Status: DC

## 2021-07-10 NOTE — Telephone Encounter (Signed)
Pharmacy requested refill.  Pended Rx and sent to [facility] for approval due to [place].

## 2021-07-18 ENCOUNTER — Other Ambulatory Visit: Payer: Self-pay | Admitting: *Deleted

## 2021-07-18 MED ORDER — LOVASTATIN 20 MG PO TABS: 20.0000 mg | ORAL_TABLET | Freq: Every morning | ORAL | 1 refills | Status: DC

## 2021-07-18 NOTE — Telephone Encounter (Signed)
[facility] requested refill.

## 2021-08-21 ENCOUNTER — Other Ambulatory Visit: Payer: Self-pay | Admitting: *Deleted

## 2021-08-21 DIAGNOSIS — E039 Hypothyroidism, unspecified: Secondary | ICD-10-CM

## 2021-08-21 MED ORDER — LEVOTHYROXINE SODIUM 25 MCG PO TABS: 25.0000 ug | ORAL_TABLET | Freq: Every morning | ORAL | 5 refills | Status: DC

## 2021-08-21 NOTE — Telephone Encounter (Signed)
[facility] Requested refill.  ?

## 2021-09-05 DIAGNOSIS — M13841 Other specified arthritis, right hand: Secondary | ICD-10-CM | POA: Diagnosis not present

## 2021-09-05 DIAGNOSIS — M79644 Pain in right finger(s): Secondary | ICD-10-CM | POA: Diagnosis not present

## 2021-09-05 DIAGNOSIS — M19041 Primary osteoarthritis, right hand: Secondary | ICD-10-CM | POA: Insufficient documentation

## 2021-10-17 DIAGNOSIS — E039 Hypothyroidism, unspecified: Secondary | ICD-10-CM | POA: Diagnosis not present

## 2021-10-17 DIAGNOSIS — I10 Essential (primary) hypertension: Secondary | ICD-10-CM | POA: Diagnosis not present

## 2021-10-17 DIAGNOSIS — E7800 Pure hypercholesterolemia, unspecified: Secondary | ICD-10-CM | POA: Diagnosis not present

## 2021-10-17 LAB — BASIC METABOLIC PANEL
BUN: 23 — AB (ref 4–21)
CO2: 25 — AB (ref 13–22)
Chloride: 103 (ref 99–108)
Creatinine: 0.8 (ref 0.5–1.1)
Glucose: 93
Potassium: 4 mEq/L (ref 3.5–5.1)
Sodium: 139 (ref 137–147)

## 2021-10-17 LAB — CBC AND DIFFERENTIAL
HCT: 37 (ref 36–46)
Hemoglobin: 12.4 (ref 12.0–16.0)
Platelets: 210 10*3/uL (ref 150–400)
WBC: 5.1

## 2021-10-17 LAB — LIPID PANEL
Cholesterol: 182 (ref 0–200)
HDL: 67 (ref 35–70)
LDL Cholesterol: 101
LDl/HDL Ratio: 2.7
Triglycerides: 68 (ref 40–160)

## 2021-10-17 LAB — CBC: RBC: 3.98 (ref 3.87–5.11)

## 2021-10-17 LAB — HEPATIC FUNCTION PANEL
ALT: 7 U/L (ref 7–35)
AST: 14 (ref 13–35)
Alkaline Phosphatase: 83 (ref 25–125)
Bilirubin, Total: 0.4

## 2021-10-17 LAB — COMPREHENSIVE METABOLIC PANEL
Albumin: 3.9 (ref 3.5–5.0)
Calcium: 9.5 (ref 8.7–10.7)
Globulin: 2.2

## 2021-10-17 LAB — TSH: TSH: 4.49 (ref 0.41–5.90)

## 2021-10-23 ENCOUNTER — Non-Acute Institutional Stay: Payer: Medicare Other | Admitting: Adult Health

## 2021-10-23 ENCOUNTER — Encounter: Payer: Self-pay | Admitting: Adult Health

## 2021-10-23 VITALS — BP 142/80 | HR 90 | Temp 97.0°F | Ht 67.0 in | Wt 164.6 lb

## 2021-10-23 DIAGNOSIS — I10 Essential (primary) hypertension: Secondary | ICD-10-CM

## 2021-10-23 DIAGNOSIS — K5901 Slow transit constipation: Secondary | ICD-10-CM

## 2021-10-23 DIAGNOSIS — M810 Age-related osteoporosis without current pathological fracture: Secondary | ICD-10-CM

## 2021-10-23 DIAGNOSIS — E039 Hypothyroidism, unspecified: Secondary | ICD-10-CM

## 2021-10-23 DIAGNOSIS — E7800 Pure hypercholesterolemia, unspecified: Secondary | ICD-10-CM | POA: Diagnosis not present

## 2021-10-23 DIAGNOSIS — I872 Venous insufficiency (chronic) (peripheral): Secondary | ICD-10-CM | POA: Diagnosis not present

## 2021-10-23 NOTE — Patient Instructions (Signed)
Recommend covid booster ?

## 2021-10-23 NOTE — Progress Notes (Addendum)
? ?Location:  [facility] ? ?POS: Clinic ? ?Provider: Royal Hawthorn, ANP ? ?Code Status:  ?Goals of Care:  ? ?  10/20/2021  ?  4:21 PM  ?Advanced Directives  ?Does Patient Have a Medical Advance Directive? Yes  ?Type of Advance Directive Healthcare Power of Attorney  ?Does patient want to make changes to medical advance directive? No - Patient declined  ? ? ? ?Chief Complaint  ?Patient presents with  ?? Medical Management of Chronic Issues  ?  Patient returns to the clinic for her 4 month follow up.   ?? Quality Metric Gaps  ?  Verified [facility] and [facility] patient is due for PCV and #4 covid vaccine  ? ? ?HPI: Patient is a 88 y.o. female seen today for medical management of chronic diseases.   ? ?PMH Sigmoid diverticulitis diagnosed in 2/22 hyperlipidemia, hypertension, chronic lower extremity edema, hypothyroid, MCI MMSE 23/30 ? ?She expresses concern about her weight. Has gained 8 lbs.  Walking each day.  ?Wt Readings from Last 3 Encounters:  ?10/23/21 164 lb 9.6 oz (74.7 kg)  ?06/28/21 166 lb 12.8 oz (75.7 kg)  ?03/26/21 156 lb (70.8 kg)  ? ?TSH 4.49 on synthroid 25 mcg ? ? ?LDL 101 not on statin ? ?Wears a glove to support her right hand/wrist. Has seen a hand surgeon, Caralyn Guile March 2023. Recommended voltaren gel ? ?Not driving anymore, feels trapped at times.  ? ?Past Medical History:  ?Diagnosis Date  ?? Arthritis   ?? Closed left femoral fracture ([place]) 2012  ? s/p ORIF, Dr. Tamera Punt  ?? Closed left radial fracture 2012  ? s/p reduction  ?? Complication of anesthesia 08/14/2016  ? not able to think clear for a long time afterwards  ?? Essential hypertension   ?? Hyperlipidemia   ?? Hypothyroidism   ? ? ?Past Surgical History:  ?Procedure Laterality Date  ?? ABDOMINAL HYSTERECTOMY    ?? CATARACT EXTRACTION W/ INTRAOCULAR LENS  IMPLANT, BILATERAL Bilateral 2014  ?? COLONOSCOPY    ?? ORIF FEMUR FRACTURE Left 04/08/2011  ? Dr. Tamera Punt  ?? TOTAL HIP ARTHROPLASTY Left 08/20/2016  ? Procedure: TOTAL HIP ARTHROPLASTY  POSTERIOR REMOVE [place] NAIL;  Surgeon: Frederik Pear, MD;  Location: [facility];  Service: Orthopedics;  Laterality: Left;  ? ? ?Allergies  ?Allergen Reactions  ?? Ambien [Zolpidem]   ?? Amoxicillin   ?  UNSPECIFIED REACTION  ? ?Has patient had a PCN reaction causing immediate rash, facial/tongue/throat swelling, SOB or lightheadedness with hypotension: Unknown ?Has patient had a PCN reaction causing severe rash involving mucus membranes or skin necrosis: Unknown ?Has patient had a PCN reaction that required hospitalization No ?Has patient had a PCN reaction occurring within the last 10 years: No ?If all of the above answers are "NO", then may proceed with Cephalosporin use. ?  ?? Shellfish Allergy   ? ? ?Outpatient Encounter Medications as of 10/23/2021  ?Medication Sig  ?? acetaminophen (TYLENOL) 325 MG tablet Take 650 mg by mouth 3 (three) times daily as needed.  ?? Ascorbic Acid 125 MG CHEW Chew 1 tablet by mouth daily.  ?? azithromycin (ZITHROMAX) 500 MG tablet Take 1,000 mg by mouth daily. Before dental visits  ?? Cholecalciferol 50 MCG (2000 UT) CAPS Take 1 capsule (2,000 Units total) by mouth daily.  ?? hydrochlorothiazide (HYDRODIURIL) 25 MG tablet Take 12.5 mg by mouth daily.  ?? levothyroxine (SYNTHROID) 25 MCG tablet Take 1 tablet (25 mcg total) by mouth every morning.  ?? lovastatin (MEVACOR) 20 MG tablet Take  20 mg by mouth daily.  ?? Lutein-Zeaxanthin 25-5 MG CAPS Take 1 capsule by mouth daily at 6 (six) AM.  ?? polyethylene glycol (MIRALAX / GLYCOLAX) 17 g packet Take 17 g by mouth daily as needed.  ?? [DISCONTINUED] acetaminophen (TYLENOL) 325 MG tablet Take 650 mg by mouth 3 (three) times daily as needed for mild pain.  ?? [DISCONTINUED] lovastatin (MEVACOR) 20 MG tablet Take 1 tablet (20 mg total) by mouth every morning. (Patient not taking: Reported on 10/23/2021)  ? ?No facility-administered encounter medications on file as of 10/23/2021.  ? ? ?Review of Systems:  ?Review of Systems  ?Constitutional:   Negative for activity change, appetite change, chills, diaphoresis, fatigue, fever and unexpected weight change.  ?HENT:  Negative for congestion.   ?Respiratory:  Negative for cough, shortness of breath and wheezing.   ?Cardiovascular:  Positive for leg swelling. Negative for chest pain and palpitations.  ?Gastrointestinal:  Negative for abdominal distention, abdominal pain, constipation and diarrhea.  ?Genitourinary:  Negative for difficulty urinating and dysuria.  ?Musculoskeletal:  Positive for gait problem. Negative for arthralgias, back pain, joint swelling and myalgias.  ?Neurological:  Negative for dizziness, tremors, seizures, syncope, facial asymmetry, speech difficulty, weakness, light-headedness, numbness and headaches.  ?Psychiatric/Behavioral:  Negative for agitation, behavioral problems and confusion.   ? ?Health Maintenance  ?Topic Date Due  ?? Pneumonia Vaccine 16+ Years old (1 - PCV) 06/23/1995  ?? COVID-19 Vaccine (4 - Booster for Moderna series) 06/28/2020  ?? INFLUENZA VACCINE  01/16/2022  ?? TETANUS/TDAP  04/02/2022  ?? DEXA SCAN  Completed  ?? Zoster Vaccines- Shingrix  Completed  ?? HPV VACCINES  Aged Out  ? ? ?Physical Exam: ?Vitals:  ? 10/23/21 1447  ?BP: (!) 142/80  ?Pulse: 90  ?Temp: (!) 97 ?F (36.1 ?C)  ?SpO2: 97%  ?Weight: 164 lb 9.6 oz (74.7 kg)  ?Height: 5' 7"  (1.702 m)  ? ?Body mass index is 25.78 kg/m?Marland Kitchen ?Physical Exam ?Vitals and nursing note reviewed.  ?Constitutional:   ?   General: She is not in acute distress. ?   Appearance: She is not diaphoretic.  ?HENT:  ?   Head: Normocephalic and atraumatic.  ?   Right Ear: Tympanic membrane normal.  ?   Left Ear: Tympanic membrane normal.  ?   Nose: Nose normal. No congestion.  ?   Mouth/Throat:  ?   Mouth: Mucous membranes are moist.  ?   Pharynx: Oropharynx is clear.  ?Eyes:  ?   Conjunctiva/sclera: Conjunctivae normal.  ?   Pupils: Pupils are equal, round, and reactive to light.  ?Neck:  ?   Vascular: No JVD.  ?Cardiovascular:  ?    Rate and Rhythm: Normal rate and regular rhythm.  ?   Heart sounds: Murmur heard.  ?Pulmonary:  ?   Effort: Pulmonary effort is normal. No respiratory distress.  ?   Breath sounds: Normal breath sounds. No wheezing.  ?Musculoskeletal:  ?   Comments: BLE edema +1  ?Skin: ?   General: Skin is warm and dry.  ?Neurological:  ?   Mental Status: She is alert and oriented to person, place, and time.  ? ? ?Labs reviewed: ?Basic Metabolic Panel: ?Recent Labs  ?  01/02/21 ?0000 10/17/21 ?0000  ?NA 141 139  ?K 4.0 4.0  ?CL 104 103  ?CO2 26* 25*  ?BUN 20 23*  ?CREATININE 0.8 0.8  ?CALCIUM 9.3 9.5  ?TSH  --  4.49  ? ?Liver Function Tests: ?Recent Labs  ?  10/17/21 ?  0000  ?AST 14  ?ALT 7  ?ALKPHOS 83  ?ALBUMIN 3.9  ? ?No results for input(s): LIPASE, AMYLASE in the last 8760 hours. ?No results for input(s): AMMONIA in the last 8760 hours. ?CBC: ?Recent Labs  ?  01/02/21 ?0000 10/17/21 ?0000  ?WBC 4.7 5.1  ?HGB 12.5 12.4  ?HCT 36 37  ?PLT 217 210  ? ?Lipid Panel: ?Recent Labs  ?  01/02/21 ?0000 10/17/21 ?0000  ?CHOL 180 182  ?HDL 72* 67  ?[place] 94 101  ?TRIG 72 68  ? ?Lab Results  ?Component Value Date  ? HGBA1C 5.7 (H) 04/10/2011  ? ? ?Procedures since last visit: ?No results found. ? ?Assessment/Plan ? ?1. Primary hypertension ?Controlled ?Continue hctz ? ?2. Chronic venous insufficiency ?Continue hctz ?Not wearing hose ?Recommend elevation when sedentary ? ?3. Acquired hypothyroidism ?Lab Results  ?Component Value Date  ? TSH 4.49 10/17/2021  ? ?Continue synthroid  ? ?4. Senile osteoporosis ?On vit d ?Weight bearing exercise as tolerated ?No longer getting dexa scans  ? ?5. Pure hypercholesterolemia ?Lab Results  ?Component Value Date  ? [place] 101 10/17/2021  ?Continue lovastatin ? ? ?6. Slow transit constipation ?Continue miralax daily prn ? ? ?Labs/tests ordered:  * No order type specified * ?Next appt:  4 months with Dr Lyndel Safe.  ? ? ?Total time 101mn:  time greater than 50% of total time spent doing pt counseling and  coordination of care  ?  ? ? Recommend covid vaccine booster  ?

## 2021-10-25 ENCOUNTER — Encounter: Payer: Self-pay | Admitting: Adult Health

## 2021-12-27 DIAGNOSIS — L570 Actinic keratosis: Secondary | ICD-10-CM | POA: Diagnosis not present

## 2021-12-27 DIAGNOSIS — L821 Other seborrheic keratosis: Secondary | ICD-10-CM | POA: Diagnosis not present

## 2022-01-02 ENCOUNTER — Other Ambulatory Visit: Payer: Self-pay | Admitting: Orthopedic Surgery

## 2022-01-02 DIAGNOSIS — R531 Weakness: Secondary | ICD-10-CM

## 2022-01-02 MED ORDER — ASCORBIC ACID 125 MG PO CHEW: 1.0000 | CHEWABLE_TABLET | Freq: Every day | ORAL | 1 refills | Status: DC

## 2022-01-05 ENCOUNTER — Other Ambulatory Visit: Payer: Self-pay | Admitting: Internal Medicine

## 2022-01-05 ENCOUNTER — Other Ambulatory Visit: Payer: Self-pay | Admitting: Adult Health

## 2022-01-05 DIAGNOSIS — R531 Weakness: Secondary | ICD-10-CM

## 2022-01-05 MED ORDER — ASCORBIC ACID 125 MG PO CHEW
1.0000 | CHEWABLE_TABLET | Freq: Every day | ORAL | 3 refills | Status: DC
Start: 2022-01-05 — End: 2023-01-09

## 2022-01-11 ENCOUNTER — Other Ambulatory Visit: Payer: Self-pay | Admitting: Internal Medicine

## 2022-02-21 ENCOUNTER — Other Ambulatory Visit: Payer: Self-pay | Admitting: *Deleted

## 2022-02-21 ENCOUNTER — Encounter: Payer: Self-pay | Admitting: Internal Medicine

## 2022-02-21 ENCOUNTER — Non-Acute Institutional Stay: Payer: Medicare Other | Admitting: Internal Medicine

## 2022-02-21 VITALS — BP 104/78 | HR 75 | Temp 97.7°F | Ht 67.0 in | Wt 164.6 lb

## 2022-02-21 DIAGNOSIS — M810 Age-related osteoporosis without current pathological fracture: Secondary | ICD-10-CM

## 2022-02-21 DIAGNOSIS — G3184 Mild cognitive impairment, so stated: Secondary | ICD-10-CM | POA: Diagnosis not present

## 2022-02-21 DIAGNOSIS — I10 Essential (primary) hypertension: Secondary | ICD-10-CM

## 2022-02-21 DIAGNOSIS — I872 Venous insufficiency (chronic) (peripheral): Secondary | ICD-10-CM

## 2022-02-21 DIAGNOSIS — E7800 Pure hypercholesterolemia, unspecified: Secondary | ICD-10-CM | POA: Diagnosis not present

## 2022-02-21 DIAGNOSIS — H539 Unspecified visual disturbance: Secondary | ICD-10-CM | POA: Diagnosis not present

## 2022-02-21 DIAGNOSIS — E039 Hypothyroidism, unspecified: Secondary | ICD-10-CM

## 2022-02-21 MED ORDER — HYDROCHLOROTHIAZIDE 25 MG PO TABS: 12.5000 mg | ORAL_TABLET | Freq: Every day | ORAL | 5 refills | Status: DC

## 2022-02-21 NOTE — Patient Instructions (Signed)
Need to get PCV 20 Pneumonia Vaccine from any pharmacy

## 2022-02-21 NOTE — Progress Notes (Signed)
Location:  [place] of Service:  Clinic (12)  Provider:   Code Status: DNR Goals of Care:     02/21/2022   10:34 AM  Advanced Directives  Does Patient Have a Medical Advance Directive? Yes  Type of Advance Directive [place]  Does patient want to make changes to medical advance directive? No - Patient declined     Chief Complaint  Patient presents with   Medical Management of Chronic Issues    4 month follow up. Discuss need for Pneumonia 65+ vaccine.    HPI: Patient is a 88 y.o. female seen today for medical management of chronic diseases.    Lives in [place] apartments  Sigmoid diverticulitis diagnosed in 2/22  history of hyperlipidemia, hypertension, chronic lower extremity edema, hypothyroid. Herpes Zoster Facial  break out on 10/22 with Facial swelling   Doing well No Complains No Falls Walks with her walker Difficulty coming with right words sometimes  Past Medical History:  Diagnosis Date   Arthritis    Closed left femoral fracture ([place]) 2012   s/p ORIF, Dr. Tamera Punt   Closed left radial fracture 2012   s/p reduction   Complication of anesthesia 08/14/2016   not able to think clear for a long time afterwards   Essential hypertension    Hyperlipidemia    Hypothyroidism     Past Surgical History:  Procedure Laterality Date   ABDOMINAL HYSTERECTOMY     CATARACT EXTRACTION W/ INTRAOCULAR LENS  IMPLANT, BILATERAL Bilateral 2014   COLONOSCOPY     ORIF FEMUR FRACTURE Left 04/08/2011   Dr. Tamera Punt   TOTAL HIP ARTHROPLASTY Left 08/20/2016   Procedure: TOTAL HIP ARTHROPLASTY POSTERIOR REMOVE TROCH NAIL;  Surgeon: Frederik Pear, MD;  Location: [facility];  Service: Orthopedics;  Laterality: Left;    Allergies  Allergen Reactions   Ambien [Zolpidem]    Amoxicillin     UNSPECIFIED REACTION   Has patient had a PCN reaction causing immediate rash, facial/tongue/throat swelling, SOB or lightheadedness with hypotension:  Unknown Has patient had a PCN reaction causing severe rash involving mucus membranes or skin necrosis: Unknown Has patient had a PCN reaction that required hospitalization No Has patient had a PCN reaction occurring within the last 10 years: No If all of the above answers are "NO", then may proceed with Cephalosporin use.    Shellfish Allergy     Outpatient Encounter Medications as of 02/21/2022  Medication Sig   acetaminophen (TYLENOL) 325 MG tablet Take 650 mg by mouth 3 (three) times daily as needed.   Ascorbic Acid 125 MG CHEW Chew 1 tablet by mouth daily.   azithromycin (ZITHROMAX) 500 MG tablet Take 1,000 mg by mouth daily. Before dental visits   Cholecalciferol 50 MCG (2000 UT) CAPS Take 1 capsule (2,000 Units total) by mouth daily.   hydrochlorothiazide (HYDRODIURIL) 25 MG tablet Take 0.5 tablets (12.5 mg total) by mouth daily.   levothyroxine (SYNTHROID) 25 MCG tablet Take 1 tablet (25 mcg total) by mouth every morning.   lovastatin (MEVACOR) 20 MG tablet TAKE 1 TABLET BY MOUTH EVERY MORNING   Lutein-Zeaxanthin 25-5 MG CAPS Take 1 capsule by mouth daily at 6 (six) AM.   polyethylene glycol (MIRALAX / GLYCOLAX) 17 g packet Take 17 g by mouth daily as needed.   [DISCONTINUED] hydrochlorothiazide (HYDRODIURIL) 25 MG tablet Take 12.5 mg by mouth daily.   No facility-administered encounter medications on file as of 02/21/2022.    Review of Systems:  Review of Systems  Constitutional:  Negative for activity change and appetite change.  HENT: Negative.    Eyes:        C/o Seeing Floaters  Respiratory:  Negative for cough and shortness of breath.   Cardiovascular:  Negative for leg swelling.  Gastrointestinal:  Negative for constipation.  Genitourinary: Negative.   Musculoskeletal:  Positive for gait problem. Negative for arthralgias and myalgias.  Skin: Negative.   Neurological:  Negative for dizziness and weakness.  Psychiatric/Behavioral:  Negative for confusion, dysphoric mood  and sleep disturbance.     Health Maintenance  Topic Date Due   Pneumonia Vaccine 64+ Years old (1 - PCV) 06/23/1995   COVID-19 Vaccine (6 - Moderna series) 01/15/2022   INFLUENZA VACCINE  05/17/2022 (Originally 01/16/2022)   TETANUS/TDAP  04/02/2022   DEXA SCAN  Completed   Zoster Vaccines- Shingrix  Completed   HPV VACCINES  Aged Out    Physical Exam: Vitals:   02/21/22 1023  BP: 104/78  Pulse: 75  Temp: 97.7 F (36.5 C)  TempSrc: Skin  SpO2: 98%  Weight: 164 lb 9.6 oz (74.7 kg)  Height: 5' 7"  (1.702 m)   Body mass index is 25.78 kg/m. Physical Exam Vitals reviewed.  Constitutional:      Appearance: Normal appearance.  HENT:     Head: Normocephalic.     Nose: Nose normal.     Mouth/Throat:     Mouth: Mucous membranes are moist.     Pharynx: Oropharynx is clear.  Eyes:     Pupils: Pupils are equal, round, and reactive to light.  Cardiovascular:     Rate and Rhythm: Normal rate and regular rhythm.     Pulses: Normal pulses.     Heart sounds: Normal heart sounds. No murmur heard. Pulmonary:     Effort: Pulmonary effort is normal.     Breath sounds: Normal breath sounds.  Abdominal:     General: Abdomen is flat. Bowel sounds are normal.     Palpations: Abdomen is soft.  Musculoskeletal:        General: No swelling.     Cervical back: Neck supple.  Skin:    General: Skin is warm.  Neurological:     General: No focal deficit present.     Mental Status: She is alert.  Psychiatric:        Mood and Affect: Mood normal.        Thought Content: Thought content normal.    Labs reviewed: Basic Metabolic Panel: Recent Labs    10/17/21 0000  NA 139  K 4.0  CL 103  CO2 25*  BUN 23*  CREATININE 0.8  CALCIUM 9.5  TSH 4.49   Liver Function Tests: Recent Labs    10/17/21 0000  AST 14  ALT 7  ALKPHOS 83  ALBUMIN 3.9   No results for input(s): "LIPASE", "AMYLASE" in the last 8760 hours. No results for input(s): "AMMONIA" in the last 8760  hours. CBC: Recent Labs    10/17/21 0000  WBC 5.1  HGB 12.4  HCT 37  PLT 210   Lipid Panel: Recent Labs    10/17/21 0000  CHOL 182  HDL 67  LDLCALC 101  TRIG 68   Lab Results  Component Value Date   HGBA1C 5.7 (H) 04/10/2011    Procedures since last visit: No results found.  Assessment/Plan 1. Primary hypertension Doing well on HCTZ  2. Chronic venous insufficiency Managing   3. Acquired hypothyroidism Repeat TSH  4.  Pure hypercholesterolemia On statin Repeat LIpid  5. MCI (mild cognitive impairment) [place] Will need MMSE next visit  Last MMSE 23/30 Managing with help from Med management 2 daughter out of state Does not drive anymore  6. Senile osteoporosis Refuses DEXA  7. Vision disturbance Will make appointment with Her opthalmologist    Labs/tests ordered:  CBC,CMP,TSH, Lipid panel Next appt:  07/02/2022

## 2022-02-21 NOTE — Telephone Encounter (Signed)
Amber Bryant, [facility] Nurse requested refill.

## 2022-03-08 DIAGNOSIS — H02831 Dermatochalasis of right upper eyelid: Secondary | ICD-10-CM | POA: Diagnosis not present

## 2022-03-08 DIAGNOSIS — H04123 Dry eye syndrome of bilateral lacrimal glands: Secondary | ICD-10-CM | POA: Diagnosis not present

## 2022-03-08 DIAGNOSIS — H353131 Nonexudative age-related macular degeneration, bilateral, early dry stage: Secondary | ICD-10-CM | POA: Diagnosis not present

## 2022-03-08 DIAGNOSIS — H02834 Dermatochalasis of left upper eyelid: Secondary | ICD-10-CM | POA: Diagnosis not present

## 2022-03-08 DIAGNOSIS — H43813 Vitreous degeneration, bilateral: Secondary | ICD-10-CM | POA: Diagnosis not present

## 2022-03-20 ENCOUNTER — Other Ambulatory Visit (HOSPITAL_BASED_OUTPATIENT_CLINIC_OR_DEPARTMENT_OTHER): Payer: Self-pay

## 2022-03-20 DIAGNOSIS — Z23 Encounter for immunization: Secondary | ICD-10-CM | POA: Diagnosis not present

## 2022-03-20 MED ORDER — FLUAD QUADRIVALENT 0.5 ML IM PRSY
PREFILLED_SYRINGE | INTRAMUSCULAR | 0 refills | Status: DC
  Filled 2022-03-20: qty 0.5, 1d supply, fill #0

## 2022-04-05 DIAGNOSIS — Z23 Encounter for immunization: Secondary | ICD-10-CM | POA: Diagnosis not present

## 2022-05-26 ENCOUNTER — Emergency Department (HOSPITAL_BASED_OUTPATIENT_CLINIC_OR_DEPARTMENT_OTHER)
Admission: EM | Admit: 2022-05-26 | Discharge: 2022-05-26 | Disposition: A | Discharge status: Home / Self Care | Payer: Medicare Other | Attending: Emergency Medicine | Admitting: Emergency Medicine

## 2022-05-26 ENCOUNTER — Other Ambulatory Visit: Payer: Self-pay

## 2022-05-26 DIAGNOSIS — J029 Acute pharyngitis, unspecified: Secondary | ICD-10-CM | POA: Diagnosis present

## 2022-05-26 DIAGNOSIS — J069 Acute upper respiratory infection, unspecified: Secondary | ICD-10-CM | POA: Diagnosis not present

## 2022-05-26 DIAGNOSIS — N390 Urinary tract infection, site not specified: Secondary | ICD-10-CM | POA: Insufficient documentation

## 2022-05-26 DIAGNOSIS — Z1152 Encounter for screening for COVID-19: Secondary | ICD-10-CM | POA: Diagnosis not present

## 2022-05-26 LAB — GROUP A STREP BY PCR: Group A Strep by PCR: NOT DETECTED

## 2022-05-26 LAB — RESP PANEL BY RT-PCR (FLU A&B, COVID) ARPGX2
Influenza A by PCR: NEGATIVE
Influenza B by PCR: NEGATIVE
SARS Coronavirus 2 by RT PCR: NEGATIVE

## 2022-05-26 MED ORDER — ACETAMINOPHEN 325 MG PO TABS
650.0000 mg | ORAL_TABLET | Freq: Once | ORAL | Status: AC
Start: 2022-05-26 — End: 2022-05-26
  Administered 2022-05-26: 650 mg via ORAL
  Filled 2022-05-26: qty 2

## 2022-05-26 MED ORDER — DEXAMETHASONE 4 MG PO TABS
6.0000 mg | ORAL_TABLET | Freq: Once | ORAL | Status: AC
  Administered 2022-05-26: 6 mg via ORAL
  Filled 2022-05-26: qty 2

## 2022-05-26 NOTE — ED Provider Notes (Signed)
[facility] EMERGENCY DEPT Provider Note   CSN: [id number] Arrival date & time: 05/26/22  4782     History Chief Complaint  Patient presents with   Sore Throat    HPI Amber Bryant is a 88 y.o. female presenting for sore throat.  She endorses 3 days of tender lymphadenopathy, sore throat.  She Dors a history of mono and strep and states that she feels like this is similar to prior strep infections.  She denies systemic fevers or chills, nausea vomiting, syncope shortness of breath.  She is otherwise ambulatory tolerating p.o. intake this morning.  Minimal medical history, still functional and runs a [employer]. No known sick contacts.   Patient's recorded medical, surgical, social, medication list and allergies were reviewed in the Snapshot window as part of the initial history.   Review of Systems   Review of Systems  Constitutional:  Negative for chills and fever.  HENT:  Positive for sore throat. Negative for ear pain.   Eyes:  Negative for pain and visual disturbance.  Respiratory:  Negative for cough and shortness of breath.   Cardiovascular:  Negative for chest pain and palpitations.  Gastrointestinal:  Negative for abdominal pain and vomiting.  Genitourinary:  Negative for dysuria and hematuria.  Musculoskeletal:  Negative for arthralgias and back pain.  Skin:  Negative for color change and rash.  Neurological:  Negative for seizures and syncope.  All other systems reviewed and are negative.   Physical Exam Updated Vital Signs BP (!) 142/76 (BP Location: Right Arm)   Pulse 73   Temp 98.3 F (36.8 C) (Oral)   Resp 20   Ht 5' 7"  (1.702 m)   Wt 74.7 kg   SpO2 98%   BMI 25.79 kg/m  Physical Exam Vitals and nursing note reviewed.  Constitutional:      General: She is not in acute distress.    Appearance: She is well-developed.  HENT:     Head: Normocephalic and atraumatic.     Mouth/Throat:     Pharynx: Posterior oropharyngeal erythema  present. No pharyngeal swelling or oropharyngeal exudate.  Eyes:     Conjunctiva/sclera: Conjunctivae normal.  Cardiovascular:     Rate and Rhythm: Normal rate and regular rhythm.     Heart sounds: No murmur heard. Pulmonary:     Effort: Pulmonary effort is normal. No respiratory distress.     Breath sounds: Normal breath sounds.  Abdominal:     General: There is no distension.     Palpations: Abdomen is soft.     Tenderness: There is no abdominal tenderness. There is no right CVA tenderness or left CVA tenderness.  Musculoskeletal:        General: No swelling or tenderness. Normal range of motion.     Cervical back: Neck supple.  Skin:    General: Skin is warm and dry.  Neurological:     General: No focal deficit present.     Mental Status: She is alert and oriented to person, place, and time. Mental status is at baseline.     Cranial Nerves: No cranial nerve deficit.      ED Course/ Medical Decision Making/ A&P    Procedures Procedures   Medications Ordered in ED Medications  dexamethasone (DECADRON) tablet 6 mg (6 mg Oral Given 05/26/22 0841)  acetaminophen (TYLENOL) tablet 650 mg (650 mg Oral Given 05/26/22 0841)   Medical Decision Making:   Amber Bryant is a 88 y.o. female who presented to the ED today  with sore throat, cough, congestion detailed above.    Patient's presentation is complicated by their history of advanced age.  Patient placed on continuous vitals and telemetry monitoring while in ED which was reviewed periodically.   Complete initial physical exam performed, notably the patient  was hemodynamically stable in no acute distress.  Posterior oropharynx illuminated and without obvious swelling or deformity.  Patient is without neck stiffness.    Reviewed and confirmed nursing documentation for past medical history, family history, social history.    Initial Assessment:   With the patient's presentation of cough congestion, most likely diagnosis is  developing viral upper respiratory infection. Other diagnoses were considered including (but not limited to) peritonsillar abscess, retropharyngeal abscess, pneumonia. These are considered less likely due to history of present illness and physical exam findings.   This is most consistent with an acute complicated illness Considered meningitis, however patient's symptoms, vital signs, physical exam findings including lack of meningismus seem grossly less consistent at this time. Initial Plan:  Viral screening including COVID/flu testing to evaluate for common viral etiologies that need to be tracked Empiric treatment with antipyretics including acetaminophen in ambulatory setting As patient has sore throat, CENTOR Score dictates the following evaluation: Rapid testing Additionally will treat sore throat with dexamethasone dose Objective evaluation as below reviewed   Initial Study Results:   Laboratory  All laboratory results reviewed without evidence of clinically relevant pathology.    Final Assessment and Plan:   On reassessment, patient is ambulatory tolerating p.o. intake in no acute distress.   Patient's COVID test is negative. Patient is currently stable for outpatient care and management with no indication for hospitalization or transfer at this time.  Discussed all findings with patient expressed understanding.  Disposition:  Based on the above findings, I believe patient is stable for discharge.    Patient/family educated about specific return precautions for given chief complaint and symptoms.  Patient/family educated about follow-up with PCP.     Patient/family expressed understanding of return precautions and need for follow-up. Patient spoken to regarding all imaging and laboratory results and appropriate follow up for these results. All education provided in verbal form with additional information in written form. Time was allowed for answering of patient questions. Patient  discharged.    Emergency Department Medication Summary:   Medications  dexamethasone (DECADRON) tablet 6 mg (6 mg Oral Given 05/26/22 0841)  acetaminophen (TYLENOL) tablet 650 mg (650 mg Oral Given 05/26/22 0841)     Clinical Impression:  1. Upper respiratory tract infection, unspecified type      Discharge   Final Clinical Impression(s) / ED Diagnoses Final diagnoses:  Upper respiratory tract infection, unspecified type    Rx / DC Orders ED Discharge Orders     None         Tretha Sciara, MD 05/26/22 1014

## 2022-05-26 NOTE — ED Triage Notes (Signed)
Patient arrives ambulatory to room with complaints of sore throat and trouble swallowing x1 week. Patient reports her pain 3/10. She did have mono as a child, per patient.   No vomiting, diarrhea, fever or chills.  Home covid test was negative.

## 2022-06-21 DIAGNOSIS — I10 Essential (primary) hypertension: Secondary | ICD-10-CM | POA: Diagnosis not present

## 2022-06-21 DIAGNOSIS — E039 Hypothyroidism, unspecified: Secondary | ICD-10-CM | POA: Diagnosis not present

## 2022-06-21 LAB — CBC AND DIFFERENTIAL
HCT: 37 (ref 36–46)
Hemoglobin: 12.3 (ref 12.0–16.0)
Platelets: 213 10*3/uL (ref 150–400)
WBC: 5

## 2022-06-21 LAB — CBC: RBC: 3.99 (ref 3.87–5.11)

## 2022-06-21 LAB — BASIC METABOLIC PANEL
BUN: 19 (ref 4–21)
CO2: 24 — AB (ref 13–22)
Chloride: 103 (ref 99–108)
Creatinine: 0.8 (ref 0.5–1.1)
Glucose: 82
Potassium: 3.8 mEq/L (ref 3.5–5.1)
Sodium: 141 (ref 137–147)

## 2022-06-21 LAB — HEPATIC FUNCTION PANEL
ALT: 9 U/L (ref 7–35)
AST: 22 (ref 13–35)
Alkaline Phosphatase: 104 (ref 25–125)
Bilirubin, Total: 0.3

## 2022-06-21 LAB — COMPREHENSIVE METABOLIC PANEL
Calcium: 9.3 (ref 8.7–10.7)
Globulin: 2.5
eGFR: 69

## 2022-06-21 LAB — LIPID PANEL
Cholesterol: 169 (ref 0–200)
HDL: 76 — AB (ref 35–70)
LDL Cholesterol: 80
Triglycerides: 67 (ref 40–160)

## 2022-06-21 LAB — TSH: TSH: 3.76 (ref 0.41–5.90)

## 2022-06-22 ENCOUNTER — Encounter: Payer: Self-pay | Admitting: Internal Medicine

## 2022-07-02 ENCOUNTER — Encounter: Payer: Medicare Other | Admitting: Adult Health

## 2022-07-15 NOTE — Progress Notes (Unsigned)
Location:  [facility]  POS: Clinic  Provider: Royal Hawthorn, ANP  Code Status:  Goals of Care:     05/26/2022    8:20 AM  Advanced Directives  Does Patient Have a Medical Advance Directive? Yes  Type of Advance Directive Living will     No chief complaint on file.   HPI: Patient is a 89 y.o. female seen today for medical management of chronic diseases.    PMH Sigmoid diverticulitis diagnosed in 2/22 hyperlipidemia, hypertension, chronic lower extremity edema, hypothyroid, MCI MMSE 23/30   Walking each day.  Wt Readings from Last 3 Encounters:  05/26/22 164 lb 10.9 oz (74.7 kg)  02/21/22 164 lb 9.6 oz (74.7 kg)  10/23/21 164 lb 9.6 oz (74.7 kg)   Hypothyroidism Lab Results  Component Value Date   TSH 3.76 06/21/2022     Wears a glove to support her right hand/wrist. Has seen a hand surgeon, Caralyn Guile March 2023. Recommended voltaren gel  Not driving anymore, feels trapped at times.   Mammogram: aged out Dexa scan: refused in the past Colonoscopy: aged out   Past Medical History:  Diagnosis Date   Arthritis    Closed left femoral fracture ([place]) 2012   s/p ORIF, Dr. Tamera Punt   Closed left radial fracture 2012   s/p reduction   Complication of anesthesia 08/14/2016   not able to think clear for a long time afterwards   Essential hypertension    Hyperlipidemia    Hypothyroidism     Past Surgical History:  Procedure Laterality Date   ABDOMINAL HYSTERECTOMY     CATARACT EXTRACTION W/ INTRAOCULAR LENS  IMPLANT, BILATERAL Bilateral 2014   COLONOSCOPY     ORIF FEMUR FRACTURE Left 04/08/2011   Dr. Tamera Punt   TOTAL HIP ARTHROPLASTY Left 08/20/2016   Procedure: TOTAL HIP ARTHROPLASTY POSTERIOR REMOVE TROCH NAIL;  Surgeon: Frederik Pear, MD;  Location: [facility];  Service: Orthopedics;  Laterality: Left;    Allergies  Allergen Reactions   Ambien [Zolpidem]    Amoxicillin     UNSPECIFIED REACTION   Has patient had a PCN reaction causing immediate rash,  facial/tongue/throat swelling, SOB or lightheadedness with hypotension: Unknown Has patient had a PCN reaction causing severe rash involving mucus membranes or skin necrosis: Unknown Has patient had a PCN reaction that required hospitalization No Has patient had a PCN reaction occurring within the last 10 years: No If all of the above answers are "NO", then may proceed with Cephalosporin use.    Shellfish Allergy     Outpatient Encounter Medications as of 07/16/2022  Medication Sig   acetaminophen (TYLENOL) 325 MG tablet Take 650 mg by mouth 3 (three) times daily as needed.   Ascorbic Acid 125 MG CHEW Chew 1 tablet by mouth daily.   azithromycin (ZITHROMAX) 500 MG tablet Take 1,000 mg by mouth daily. Before dental visits   Cholecalciferol 50 MCG (2000 UT) CAPS Take 1 capsule (2,000 Units total) by mouth daily.   hydrochlorothiazide (HYDRODIURIL) 25 MG tablet Take 0.5 tablets (12.5 mg total) by mouth daily.   influenza vaccine adjuvanted (FLUAD QUADRIVALENT) 0.5 ML injection Inject into the muscle.   levothyroxine (SYNTHROID) 25 MCG tablet Take 1 tablet (25 mcg total) by mouth every morning.   lovastatin (MEVACOR) 20 MG tablet TAKE 1 TABLET BY MOUTH EVERY MORNING   Lutein-Zeaxanthin 25-5 MG CAPS Take 1 capsule by mouth daily at 6 (six) AM.   polyethylene glycol (MIRALAX / GLYCOLAX) 17 g packet Take 17 g by mouth  daily as needed.   No facility-administered encounter medications on file as of 07/16/2022.    Review of Systems:  Review of Systems  Constitutional:  Negative for activity change, appetite change, chills, diaphoresis, fatigue, fever and unexpected weight change.  HENT:  Negative for congestion.   Respiratory:  Negative for cough, shortness of breath and wheezing.   Cardiovascular:  Positive for leg swelling. Negative for chest pain and palpitations.  Gastrointestinal:  Negative for abdominal distention, abdominal pain, constipation and diarrhea.  Genitourinary:  Negative for  difficulty urinating and dysuria.  Musculoskeletal:  Positive for gait problem. Negative for arthralgias, back pain, joint swelling and myalgias.  Neurological:  Negative for dizziness, tremors, seizures, syncope, facial asymmetry, speech difficulty, weakness, light-headedness, numbness and headaches.  Psychiatric/Behavioral:  Negative for agitation, behavioral problems and confusion.     Health Maintenance  Topic Date Due   DTaP/Tdap/Td (1 - Tdap) Never done   Pneumonia Vaccine 98+ Years old (1 - PCV) 06/23/1995   Medicare Annual Wellness (AWV)  04/28/2021   COVID-19 Vaccine (7 - 2023-24 season) 02/16/2022   INFLUENZA VACCINE  Completed   DEXA SCAN  Completed   Zoster Vaccines- Shingrix  Completed   HPV VACCINES  Aged Out    Physical Exam: There were no vitals filed for this visit.  There is no height or weight on file to calculate BMI. Physical Exam Vitals and nursing note reviewed.  Constitutional:      General: She is not in acute distress.    Appearance: She is not diaphoretic.  HENT:     Head: Normocephalic and atraumatic.     Right Ear: Tympanic membrane normal.     Left Ear: Tympanic membrane normal.     Nose: Nose normal. No congestion.     Mouth/Throat:     Mouth: Mucous membranes are moist.     Pharynx: Oropharynx is clear.  Eyes:     Conjunctiva/sclera: Conjunctivae normal.     Pupils: Pupils are equal, round, and reactive to light.  Neck:     Vascular: No JVD.  Cardiovascular:     Rate and Rhythm: Normal rate and regular rhythm.     Heart sounds: Murmur heard.  Pulmonary:     Effort: Pulmonary effort is normal. No respiratory distress.     Breath sounds: Normal breath sounds. No wheezing.  Musculoskeletal:     Comments: BLE edema +1  Skin:    General: Skin is warm and dry.  Neurological:     Mental Status: She is alert and oriented to person, place, and time.     Labs reviewed: Basic Metabolic Panel: Recent Labs    10/17/21 0000 06/21/22 0000   NA 139 141  K 4.0 3.8  CL 103 103  CO2 25* 24*  BUN 23* 19  CREATININE 0.8 0.8  CALCIUM 9.5 9.3  TSH 4.49 3.76    Liver Function Tests: Recent Labs    10/17/21 0000 06/21/22 0000  AST 14 22  ALT 7 9  ALKPHOS 83 104  ALBUMIN 3.9  --     No results for input(s): "LIPASE", "AMYLASE" in the last 8760 hours. No results for input(s): "AMMONIA" in the last 8760 hours. CBC: Recent Labs    10/17/21 0000 06/21/22 0000  WBC 5.1 5.0  HGB 12.4 12.3  HCT 37 37  PLT 210 213    Lipid Panel: Recent Labs    10/17/21 0000 06/21/22 0000  CHOL 182 169  HDL 67 76*  LDLCALC 101  80  TRIG 68 67    Lab Results  Component Value Date   HGBA1C 5.7 (H) 04/10/2011    Procedures since last visit: No results found.  Assessment/Plan  1. Primary hypertension Controlled Continue hctz  2. Chronic venous insufficiency Continue hctz Not wearing hose Recommend elevation when sedentary  3. Acquired hypothyroidism Lab Results  Component Value Date   TSH 3.76 06/21/2022   Continue synthroid   4. Senile osteoporosis On vit d Weight bearing exercise as tolerated No longer getting dexa scans   5. Pure hypercholesterolemia Lab Results  Component Value Date   LDLCALC 80 06/21/2022  Continue lovastatin   6. Slow transit constipation Continue miralax daily prn   Labs/tests ordered:  * No order type specified * Next appt:  4 months with Dr Lyndel Safe.    Total time 46mn:  time greater than 50% of total time spent doing pt counseling and coordination of care

## 2022-07-16 ENCOUNTER — Encounter: Payer: Self-pay | Admitting: Adult Health

## 2022-07-16 ENCOUNTER — Non-Acute Institutional Stay: Payer: Medicare Other | Admitting: Adult Health

## 2022-07-16 VITALS — BP 132/76 | HR 78 | Temp 97.8°F | Resp 18 | Ht 67.0 in | Wt 159.0 lb

## 2022-07-16 DIAGNOSIS — E039 Hypothyroidism, unspecified: Secondary | ICD-10-CM

## 2022-07-16 DIAGNOSIS — I872 Venous insufficiency (chronic) (peripheral): Secondary | ICD-10-CM

## 2022-07-16 DIAGNOSIS — I10 Essential (primary) hypertension: Secondary | ICD-10-CM | POA: Diagnosis not present

## 2022-07-16 DIAGNOSIS — E7800 Pure hypercholesterolemia, unspecified: Secondary | ICD-10-CM

## 2022-07-16 DIAGNOSIS — K5901 Slow transit constipation: Secondary | ICD-10-CM

## 2022-07-16 NOTE — Patient Instructions (Addendum)
Recommend tdap vaccine and Prevnar 20 vaccine.

## 2022-07-18 ENCOUNTER — Encounter: Payer: Self-pay | Admitting: Adult Health

## 2022-08-28 DIAGNOSIS — Z961 Presence of intraocular lens: Secondary | ICD-10-CM | POA: Diagnosis not present

## 2022-08-28 DIAGNOSIS — H5211 Myopia, right eye: Secondary | ICD-10-CM | POA: Diagnosis not present

## 2022-08-28 DIAGNOSIS — H353131 Nonexudative age-related macular degeneration, bilateral, early dry stage: Secondary | ICD-10-CM | POA: Diagnosis not present

## 2022-08-28 DIAGNOSIS — H5202 Hypermetropia, left eye: Secondary | ICD-10-CM | POA: Diagnosis not present

## 2022-08-30 DIAGNOSIS — L821 Other seborrheic keratosis: Secondary | ICD-10-CM | POA: Diagnosis not present

## 2022-08-30 DIAGNOSIS — L720 Epidermal cyst: Secondary | ICD-10-CM | POA: Diagnosis not present

## 2022-08-30 DIAGNOSIS — L570 Actinic keratosis: Secondary | ICD-10-CM | POA: Diagnosis not present

## 2022-10-11 DIAGNOSIS — Z23 Encounter for immunization: Secondary | ICD-10-CM | POA: Diagnosis not present

## 2022-10-17 DIAGNOSIS — L821 Other seborrheic keratosis: Secondary | ICD-10-CM | POA: Diagnosis not present

## 2022-10-17 DIAGNOSIS — L570 Actinic keratosis: Secondary | ICD-10-CM | POA: Diagnosis not present

## 2022-12-25 ENCOUNTER — Encounter (HOSPITAL_BASED_OUTPATIENT_CLINIC_OR_DEPARTMENT_OTHER): Payer: Self-pay

## 2022-12-25 ENCOUNTER — Emergency Department (HOSPITAL_BASED_OUTPATIENT_CLINIC_OR_DEPARTMENT_OTHER)
Admission: EM | Admit: 2022-12-25 | Discharge: 2022-12-25 | Disposition: A | Discharge status: Home / Self Care | Payer: Medicare Other | Attending: Emergency Medicine | Admitting: Emergency Medicine

## 2022-12-25 ENCOUNTER — Other Ambulatory Visit: Payer: Self-pay

## 2022-12-25 ENCOUNTER — Non-Acute Institutional Stay: Payer: Medicare Other | Admitting: Internal Medicine

## 2022-12-25 ENCOUNTER — Encounter: Payer: Self-pay | Admitting: Internal Medicine

## 2022-12-25 ENCOUNTER — Emergency Department (HOSPITAL_BASED_OUTPATIENT_CLINIC_OR_DEPARTMENT_OTHER): Payer: Medicare Other

## 2022-12-25 VITALS — BP 138/72 | HR 91 | Temp 97.9°F | Resp 16 | Ht 67.0 in | Wt 158.2 lb

## 2022-12-25 DIAGNOSIS — K625 Hemorrhage of anus and rectum: Secondary | ICD-10-CM | POA: Diagnosis not present

## 2022-12-25 DIAGNOSIS — K8020 Calculus of gallbladder without cholecystitis without obstruction: Secondary | ICD-10-CM | POA: Diagnosis not present

## 2022-12-25 DIAGNOSIS — D649 Anemia, unspecified: Secondary | ICD-10-CM | POA: Insufficient documentation

## 2022-12-25 DIAGNOSIS — K862 Cyst of pancreas: Secondary | ICD-10-CM | POA: Diagnosis not present

## 2022-12-25 DIAGNOSIS — K922 Gastrointestinal hemorrhage, unspecified: Secondary | ICD-10-CM | POA: Insufficient documentation

## 2022-12-25 DIAGNOSIS — K5792 Diverticulitis of intestine, part unspecified, without perforation or abscess without bleeding: Secondary | ICD-10-CM | POA: Diagnosis not present

## 2022-12-25 DIAGNOSIS — E876 Hypokalemia: Secondary | ICD-10-CM | POA: Insufficient documentation

## 2022-12-25 DIAGNOSIS — I10 Essential (primary) hypertension: Secondary | ICD-10-CM | POA: Insufficient documentation

## 2022-12-25 DIAGNOSIS — Z79899 Other long term (current) drug therapy: Secondary | ICD-10-CM | POA: Insufficient documentation

## 2022-12-25 DIAGNOSIS — K529 Noninfective gastroenteritis and colitis, unspecified: Secondary | ICD-10-CM | POA: Insufficient documentation

## 2022-12-25 DIAGNOSIS — R195 Other fecal abnormalities: Secondary | ICD-10-CM | POA: Diagnosis present

## 2022-12-25 DIAGNOSIS — N281 Cyst of kidney, acquired: Secondary | ICD-10-CM | POA: Diagnosis not present

## 2022-12-25 DIAGNOSIS — R1031 Right lower quadrant pain: Secondary | ICD-10-CM | POA: Diagnosis not present

## 2022-12-25 DIAGNOSIS — K5730 Diverticulosis of large intestine without perforation or abscess without bleeding: Secondary | ICD-10-CM | POA: Diagnosis not present

## 2022-12-25 LAB — CBC
HCT: 35.5 % — ABNORMAL LOW (ref 36.0–46.0)
Hemoglobin: 11.8 g/dL — ABNORMAL LOW (ref 12.0–15.0)
MCH: 30.7 pg (ref 26.0–34.0)
MCHC: 33.2 g/dL (ref 30.0–36.0)
MCV: 92.4 fL (ref 80.0–100.0)
Platelets: 257 10*3/uL (ref 150–400)
RBC: 3.84 MIL/uL — ABNORMAL LOW (ref 3.87–5.11)
RDW: 13.2 % (ref 11.5–15.5)
WBC: 7.2 10*3/uL (ref 4.0–10.5)
nRBC: 0 % (ref 0.0–0.2)

## 2022-12-25 LAB — LACTIC ACID, PLASMA: Lactic Acid, Venous: 0.9 mmol/L (ref 0.5–1.9)

## 2022-12-25 LAB — COMPREHENSIVE METABOLIC PANEL
ALT: 9 U/L (ref 0–44)
AST: 15 U/L (ref 15–41)
Albumin: 4.1 g/dL (ref 3.5–5.0)
Alkaline Phosphatase: 105 U/L (ref 38–126)
Anion gap: 10 (ref 5–15)
BUN: 19 mg/dL (ref 8–23)
CO2: 26 mmol/L (ref 22–32)
Calcium: 9.9 mg/dL (ref 8.9–10.3)
Chloride: 100 mmol/L (ref 98–111)
Creatinine, Ser: 0.81 mg/dL (ref 0.44–1.00)
GFR, Estimated: >60 mL/min (ref >60)
Glucose, Bld: 113 mg/dL — ABNORMAL HIGH (ref 70–99)
Potassium: 3 mmol/L — ABNORMAL LOW (ref 3.5–5.1)
Sodium: 136 mmol/L (ref 135–145)
Total Bilirubin: 0.6 mg/dL (ref 0.3–1.2)
Total Protein: 7.1 g/dL (ref 6.5–8.1)

## 2022-12-25 MED ORDER — IOHEXOL 300 MG/ML  SOLN
100.0000 mL | Freq: Once | INTRAMUSCULAR | Status: AC | PRN
  Administered 2022-12-25: 80 mL via INTRAVENOUS

## 2022-12-25 MED ORDER — METRONIDAZOLE 500 MG PO TABS
500.0000 mg | ORAL_TABLET | Freq: Once | ORAL | Status: DC
  Filled 2022-12-25: qty 1

## 2022-12-25 MED ORDER — METRONIDAZOLE 500 MG PO TABS: 500.0000 mg | ORAL_TABLET | Freq: Three times a day (TID) | ORAL | 0 refills | Status: DC

## 2022-12-25 MED ORDER — CIPROFLOXACIN HCL 500 MG PO TABS: 500.0000 mg | ORAL_TABLET | Freq: Two times a day (BID) | ORAL | 0 refills | Status: DC

## 2022-12-25 MED ORDER — CIPROFLOXACIN HCL 500 MG PO TABS
500.0000 mg | ORAL_TABLET | Freq: Once | ORAL | Status: DC
  Filled 2022-12-25: qty 1

## 2022-12-25 MED ORDER — POTASSIUM CHLORIDE CRYS ER 20 MEQ PO TBCR
40.0000 meq | EXTENDED_RELEASE_TABLET | Freq: Once | ORAL | Status: AC
  Administered 2022-12-25: 40 meq via ORAL
  Filled 2022-12-25: qty 2

## 2022-12-25 NOTE — ED Triage Notes (Signed)
Patient here POV from Home.  Endorses Rectal Bleeding for 3 Days. No N/V. No Discernable ABD Discomfort. No Fevers at Home. No Anticoagulants.   NAD Noted during Triage. A&Ox4. Gcs 15. BIB Wheelchair.

## 2022-12-25 NOTE — Discharge Instructions (Signed)
Please read and follow all provided instructions.  Your diagnoses today include:  1. Colitis   2. Lower GI bleeding    Tests performed today include: Blood cell counts and platelets Kidney and liver function tests Pancreas function test (called lipase) Urine test to look for infection CT scan of your abdomen pelvis: Suggested recurrent colitis Vital signs. See below for your results today.   Medications prescribed:  None  Take any prescribed medications only as directed.  Home care instructions:  Follow any educational materials contained in this packet. You have decided to hold off on antibiotics until you can talk with your gastroenterologist.  Please call them first thing in the morning to discuss recommendations regarding the colitis seen on your CT scan tonight.  Follow-up instructions: Please follow-up with your primary care provider in the next 2 days for further evaluation of your symptoms.    Return instructions:  SEEK IMMEDIATE MEDICAL ATTENTION IF: The pain does not go away or becomes severe  A temperature above 101F develops  Repeated vomiting occurs (multiple episodes)  The pain becomes localized to portions of the abdomen. The right side could possibly be appendicitis. In an adult, the left lower portion of the abdomen could be colitis or diverticulitis.  Blood is being passed in stools or vomit (bright red or black tarry stools)  You develop chest pain, difficulty breathing, dizziness or fainting, or become confused, poorly responsive, or inconsolable (young children) If you have any other emergent concerns regarding your health  Additional Information: Abdominal (belly) pain can be caused by many things. Your caregiver performed an examination and possibly ordered blood/urine tests and imaging (CT scan, x-rays, ultrasound). Many cases can be observed and treated at home after initial evaluation in the emergency department. Even though you are being discharged  home, abdominal pain can be unpredictable. Therefore, you need a repeated exam if your pain does not resolve, returns, or worsens. Most patients with abdominal pain don't have to be admitted to the hospital or have surgery, but serious problems like appendicitis and gallbladder attacks can start out as nonspecific pain. Many abdominal conditions cannot be diagnosed in one visit, so follow-up evaluations are very important.  Your vital signs today were: BP 118/66   Pulse 74   Temp 99.9 F (37.7 C) (Oral)   Resp 16   Ht 5\' 7"  (1.702 m)   Wt 71.8 kg   SpO2 93%   BMI 24.79 kg/m  If your blood pressure (bp) was elevated above 135/85 this visit, please have this repeated by your doctor within one month. --------------

## 2022-12-25 NOTE — ED Notes (Signed)
Reviewed AVS/discharge instruction with patient. Time allotted for and all questions answered. Patient is agreeable for d/c and escorted to ed exit by staff.  

## 2022-12-25 NOTE — Progress Notes (Signed)
Location: [facility] editor of Service:  Clinic (12)  Provider:   Code Status:  Goals of Care:     12/25/2022    3:50 PM  Advanced Directives  Does Patient Have a Medical Advance Directive? Yes  Type of Advance Directive Healthcare Power of Attorney  Does patient want to make changes to medical advance directive? No - Patient declined  Copy of Healthcare Power of Attorney in Chart? Yes - validated most recent copy scanned in chart (See row information)     Chief Complaint  Patient presents with   Acute Visit    Patient is here for acute    HPI: Patient is a 89 y.o. female seen today for an acute visit for Rectal Bleeding  Patient has h/o Diverticulitis  For past 3 days she has been noticing Bright red Blood in her stool 3-4 times a day. She feels urge to go to bathroom and Blood comes out She also having Abdominal pain and Cramps Not eating much due to Cramps Lives by herself in Her apartment in [place] Feels weak  No Fever No Dizziness   Past Medical History:  Diagnosis Date   Arthritis    Closed left femoral fracture ([facility]) 2012   s/p ORIF, Dr. Ave Filter   Closed left radial fracture 2012   s/p reduction   Complication of anesthesia 08/14/2016   not able to think clear for a long time afterwards   Essential hypertension    Hyperlipidemia    Hypothyroidism     Past Surgical History:  Procedure Laterality Date   ABDOMINAL HYSTERECTOMY     CATARACT EXTRACTION W/ INTRAOCULAR LENS  IMPLANT, BILATERAL Bilateral 2014   COLONOSCOPY     ORIF FEMUR FRACTURE Left 04/08/2011   Dr. Ave Filter   TOTAL HIP ARTHROPLASTY Left 08/20/2016   Procedure: TOTAL HIP ARTHROPLASTY POSTERIOR REMOVE TROCH NAIL;  Surgeon: Gean Birchwood, MD;  Location: [facility];  Service: Orthopedics;  Laterality: Left;    Allergies  Allergen Reactions   Ambien [Zolpidem]    Amoxicillin     UNSPECIFIED REACTION   Has patient had a PCN reaction causing immediate rash,  facial/tongue/throat swelling, SOB or lightheadedness with hypotension: Unknown Has patient had a PCN reaction causing severe rash involving mucus membranes or skin necrosis: Unknown Has patient had a PCN reaction that required hospitalization No Has patient had a PCN reaction occurring within the last 10 years: No If all of the above answers are "NO", then may proceed with Cephalosporin use.    Onion    Shellfish Allergy     Outpatient Encounter Medications as of 12/25/2022  Medication Sig   acetaminophen (TYLENOL) 325 MG tablet Take 650 mg by mouth 3 (three) times daily as needed.   Ascorbic Acid 125 MG CHEW Chew 1 tablet by mouth daily.   azithromycin (ZITHROMAX) 500 MG tablet Take 1,000 mg by mouth daily. Before dental visits   Cholecalciferol 50 MCG (2000 UT) CAPS Take 1 capsule (2,000 Units total) by mouth daily.   hydrochlorothiazide (HYDRODIURIL) 25 MG tablet Take 0.5 tablets (12.5 mg total) by mouth daily.   influenza vaccine adjuvanted (FLUAD QUADRIVALENT) 0.5 ML injection Inject into the muscle.   levothyroxine (SYNTHROID) 25 MCG tablet Take 1 tablet (25 mcg total) by mouth every morning.   lovastatin (MEVACOR) 20 MG tablet TAKE 1 TABLET BY MOUTH EVERY MORNING   Lutein-Zeaxanthin 25-5 MG CAPS Take 1 capsule by mouth daily at 6 (six) AM.   polyethylene glycol (MIRALAX /  GLYCOLAX) 17 g packet Take 17 g by mouth daily as needed.   No facility-administered encounter medications on file as of 12/25/2022.    Review of Systems:  Review of Systems  Constitutional:  Positive for activity change and appetite change.  HENT: Negative.    Respiratory:  Negative for cough and shortness of breath.   Cardiovascular:  Negative for leg swelling.  Gastrointestinal:  Positive for blood in stool. Negative for constipation.  Genitourinary: Negative.   Musculoskeletal:  Positive for gait problem. Negative for arthralgias and myalgias.  Skin: Negative.   Neurological:  Positive for weakness.  Negative for dizziness.  Psychiatric/Behavioral:  Positive for confusion. Negative for dysphoric mood and sleep disturbance.     Health Maintenance  Topic Date Due   DTaP/Tdap/Td (1 - Tdap) Never done   Pneumonia Vaccine 65+ Years old (1 of 1 - PCV) 06/23/1995   Medicare Annual Wellness (AWV)  04/28/2021   INFLUENZA VACCINE  01/17/2023   DEXA SCAN  Completed   COVID-19 Vaccine  Completed   Zoster Vaccines- Shingrix  Completed   HPV VACCINES  Aged Out    Physical Exam: There were no vitals filed for this visit. There is no height or weight on file to calculate BMI. Physical Exam Vitals reviewed.  Constitutional:      Appearance: Normal appearance.  HENT:     Head: Normocephalic.     Nose: Nose normal.     Mouth/Throat:     Mouth: Mucous membranes are moist.     Pharynx: Oropharynx is clear.  Eyes:     Pupils: Pupils are equal, round, and reactive to light.  Cardiovascular:     Rate and Rhythm: Normal rate and regular rhythm.     Pulses: Normal pulses.     Heart sounds: Murmur heard.  Pulmonary:     Effort: Pulmonary effort is normal.     Breath sounds: Normal breath sounds.  Abdominal:     General: Abdomen is flat. Bowel sounds are normal.     Palpations: Abdomen is soft.  Genitourinary:    Comments: Rectal exam negative for hemorrhoids No Stool in vault or Blood Musculoskeletal:        General: No swelling.     Cervical back: Neck supple.  Skin:    General: Skin is warm.  Neurological:     General: No focal deficit present.     Mental Status: She is alert and oriented to person, place, and time.  Psychiatric:        Mood and Affect: Mood normal.        Thought Content: Thought content normal.     Labs reviewed: Basic Metabolic Panel: Recent Labs    06/21/22 0000  NA 141  K 3.8  CL 103  CO2 24*  BUN 19  CREATININE 0.8  CALCIUM 9.3  TSH 3.76   Liver Function Tests: Recent Labs    06/21/22 0000  AST 22  ALT 9  ALKPHOS 104   No results for  input(s): "LIPASE", "AMYLASE" in the last 8760 hours. No results for input(s): "AMMONIA" in the last 8760 hours. CBC: Recent Labs    06/21/22 0000  WBC 5.0  HGB 12.3  HCT 37  PLT 213   Lipid Panel: Recent Labs    06/21/22 0000  CHOL 169  HDL 76*  LDLCALC 80  TRIG 67   Lab Results  Component Value Date   HGBA1C 5.7 (H) 04/10/2011    Procedures since last visit: No  results found.  Assessment/Plan 1. Rectal bleeding with RLQ pain and h/o Diverticulitis We will send her to  ED for CT scan of her Abdomen and Labs Her friend is with her and will take her      Labs/tests ordered:  * No order type specified * Next appt:  01/01/2023

## 2022-12-25 NOTE — ED Provider Notes (Signed)
[place] EMERGENCY DEPARTMENT AT [facility] Provider Note   CSN: [id number] Arrival date & time: 12/25/22  1753     History  Chief Complaint  Patient presents with   Rectal Bleeding    Amber Bryant is a 89 y.o. female.  Patient with history of diverticulitis/colitis, hypertension, hyperlipidemia --presents to the emergency department today for evaluation of bloody stool.  Symptoms have been ongoing over the past 3 days.  She has noted red blood in the stool.  She saw PCP today who referred her to the emergency department for further evaluation and imaging.  She denies any significant abdominal pain or tenderness.  She denies vomiting.  She has not had any fevers documented.  She states that her appetite has overall been poor, but sometimes she just does not like to eat a lot.  [facility] in a [facility] community.  Of note, patient had an episode of diverticulitis in 2022.  Patient reports becoming very ill in regards to the antibiotics that she was given.  Friend at bedside stated that she needed IV antibiotics at her facility to help get her through.  Patient reports amoxicillin allergy as a child stating that "I grew hair on my tongue".       Home Medications Prior to Admission medications   Medication Sig Start Date End Date Taking? Authorizing Provider  ciprofloxacin (CIPRO) 500 MG tablet Take 1 tablet (500 mg total) by mouth 2 (two) times daily. 12/25/22  Yes Renne Crigler, PA-C  metroNIDAZOLE (FLAGYL) 500 MG tablet Take 1 tablet (500 mg total) by mouth 3 (three) times daily. 12/25/22  Yes Renne Crigler, PA-C  acetaminophen (TYLENOL) 325 MG tablet Take 650 mg by mouth 3 (three) times daily as needed.    [provider]  Ascorbic Acid 125 MG CHEW Chew 1 tablet by mouth daily. 01/05/22   Fletcher Anon, NP  Cholecalciferol 50 MCG (2000 UT) CAPS Take 1 capsule (2,000 Units total) by mouth daily. 07/10/21   Fletcher Anon, NP  hydrochlorothiazide  (HYDRODIURIL) 25 MG tablet Take 0.5 tablets (12.5 mg total) by mouth daily. 02/21/22   Mahlon Gammon, MD  influenza vaccine adjuvanted (FLUAD QUADRIVALENT) 0.5 ML injection Inject into the muscle. 03/19/22   Judyann Munson, MD  levothyroxine (SYNTHROID) 25 MCG tablet Take 1 tablet (25 mcg total) by mouth every morning. 08/21/21   Mahlon Gammon, MD  lovastatin (MEVACOR) 20 MG tablet TAKE 1 TABLET BY MOUTH EVERY MORNING 01/11/22   Mahlon Gammon, MD  Lutein-Zeaxanthin 25-5 MG CAPS Take 1 capsule by mouth daily at 6 (six) AM.    [provider]  polyethylene glycol (MIRALAX / GLYCOLAX) 17 g packet Take 17 g by mouth daily as needed.    [provider]      Allergies    Ambien [zolpidem], Amoxicillin, Onion, and Shellfish allergy    Review of Systems   Review of Systems  Physical Exam Updated Vital Signs BP 118/66   Pulse 74   Temp 99.9 F (37.7 C) (Oral)   Resp 16   Ht 5\' 7"  (1.702 m)   Wt 71.8 kg   SpO2 93%   BMI 24.79 kg/m  Physical Exam Vitals and nursing note reviewed.  Constitutional:      General: She is not in acute distress.    Appearance: She is well-developed.  HENT:     Head: Normocephalic and atraumatic.     Right Ear: External ear normal.     Left Ear:  External ear normal.     Nose: Nose normal.  Eyes:     Conjunctiva/sclera: Conjunctivae normal.  Cardiovascular:     Rate and Rhythm: Normal rate and regular rhythm.     Heart sounds: No murmur heard. Pulmonary:     Effort: No respiratory distress.     Breath sounds: No wheezing, rhonchi or rales.  Abdominal:     Palpations: Abdomen is soft.     Tenderness: There is abdominal tenderness. There is no guarding or rebound.     Comments: Minimal diffuse tenderness  Musculoskeletal:     Cervical back: Normal range of motion and neck supple.     Right lower leg: No edema.     Left lower leg: No edema.  Skin:    General: Skin is warm and dry.     Findings: No rash.  Neurological:     General:  No focal deficit present.     Mental Status: She is alert. Mental status is at baseline.     Motor: No weakness.  Psychiatric:        Mood and Affect: Mood normal.     ED Results / Procedures / Treatments   Labs (all labs ordered are listed, but only abnormal results are displayed) Labs Reviewed  COMPREHENSIVE METABOLIC PANEL - Abnormal; Notable for the following components:      Result Value   Potassium 3.0 (*)    Glucose, Bld 113 (*)    All other components within normal limits  CBC - Abnormal; Notable for the following components:   RBC 3.84 (*)    Hemoglobin 11.8 (*)    HCT 35.5 (*)    All other components within normal limits  LACTIC ACID, PLASMA    EKG None  Radiology CT ABDOMEN PELVIS W CONTRAST  Result Date: 12/25/2022 CLINICAL DATA:  Abdominal pain and rectal bleeding. EXAM: CT ABDOMEN AND PELVIS WITH CONTRAST TECHNIQUE: Multidetector CT imaging of the abdomen and pelvis was performed using the standard protocol following bolus administration of intravenous contrast. RADIATION DOSE REDUCTION: This exam was performed according to the departmental dose-optimization program which includes automated exposure control, adjustment of the mA and/or kV according to patient size and/or use of iterative reconstruction technique. CONTRAST:  80mL OMNIPAQUE IOHEXOL 300 MG/ML  SOLN COMPARISON:  07/29/2020 FINDINGS: Lower chest: No basilar airspace disease or pleural effusion. Hepatobiliary: Tiny cyst in the subcapsular right lobe of the liver. No suspicious liver lesion. Layering gallstones within physiologically distended gallbladder. No biliary dilatation. Pancreas: 7 mm cyst in the pancreatic tail, series 3, image 26. No ductal dilatation or inflammation. Spleen: Normal in size without focal abnormality. Adrenals/Urinary Tract: Normal adrenal glands. No hydronephrosis. There are bilateral parapelvic cysts. 9 cm cortical cyst arises from the upper pole of the right kidney. No specific  imaging follow-up is recommended. No evidence of solid lesion. The urinary bladder is partially distended, normal for degree of distension. Stomach/Bowel: There is mild wall thickening with pericolonic edema throughout the sigmoid colon, for example series 3 images 57 and 58. Minimal wall thickening is also seen involving the distal descending colon, for example series 3, image 39. multiple colonic diverticula, although length of colonic involvement favors colitis over diverticulitis. There is moderate colonic stool burden proximally. Normal appendix potentially visualized. The stomach is nondistended. There is no small bowel inflammation. Vascular/Lymphatic: Aortic atherosclerosis without aneurysm. 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 Distal branches are patent. The portal  vein is patent. Increased number of lymph nodes adjacent to the inferior mesenteric vessels as well as adjacent to inflamed sigmoid colon, likely reactive. Reproductive: Status post hysterectomy. No adnexal masses. Other: No free air or perforation. No significant ascites. No focal fluid collection. No abdominal wall hernia. Musculoskeletal: Left hip arthroplasty. Scoliosis and degenerative change in the spine. IMPRESSION: 1. Mild wall thickening with pericolonic edema throughout the sigmoid colon and to a lesser extent the distal descending colon, suspicious for colitis. There are multiple colonic diverticula, although length of colonic involvement favors colitis over diverticulitis. 2. Increased number of lymph nodes adjacent to the inferior mesenteric vessels as well as adjacent to inflamed sigmoid colon, likely reactive. 3. Cholelithiasis without CT findings of acute cholecystitis. 4. A 7 mm cyst in the pancreatic tail. Per consensus guidelines call in a patient of this age for a cyst of this size, the decision to pursue imaging follow-up should be anchored to the patient's  overall health and preferences, such workup is only advised if the patient is a surgical candidate. Follow-up imaging in 2 years can be considered. Aortic Atherosclerosis (ICD10-I70.0). Electronically Signed   By: Narda Rutherford M.D.   On: 12/25/2022 21:14    Procedures Procedures    Medications Ordered in ED Medications  ciprofloxacin (CIPRO) tablet 500 mg (has no administration in time range)  metroNIDAZOLE (FLAGYL) tablet 500 mg (has no administration in time range)  iohexol (OMNIPAQUE) 300 MG/ML solution 100 mL (80 mLs Intravenous Contrast Given 12/25/22 2045)  potassium chloride SA (KLOR-CON M) CR tablet 40 mEq (40 mEq Oral Given 12/25/22 2155)    ED Course/ Medical Decision Making/ A&P    Patient seen and examined. History obtained directly from patient. Work-up including labs, imaging, EKG ordered in triage, if performed, were reviewed.    Labs/EKG: Independently reviewed and interpreted.  This included: CBC with normal white blood cell count at 7.2, hemoglobin minimally low at 11.8 otherwise unremarkable; CMP with hypokalemia potassium 3.0 otherwise normal kidney function and liver function tests.  I have added on lactate.  Imaging: CT was ordered.  Medications/Fluids: None ordered, patient comfortable  Initial impression: Rectal bleeding, concern for recurrent colitis/diverticulitis.    Reassessment performed. Patient appears stable.  Labs personally reviewed and interpreted including: Lactate was 0.9, normal.  Imaging personally visualized and interpreted including: CT of the abdomen pelvis, agree findings suggestive of colitis.  Reviewed pertinent lab work and imaging with patient at bedside. Questions answered.   Patient was discussed with Dr. Donnald Garre.   Most current vital signs reviewed and are as follows: BP 118/66   Pulse 74   Temp 99.9 F (37.7 C) (Oral)   Resp 16   Ht 5\' 7"  (1.702 m)   Wt 71.8 kg   SpO2 93%   BMI 24.79 kg/m   Plan: I had a protracted  discussion with patient and friend at bedside regarding what she wanted to do with the antibiotics.  She had significant vomiting and side effects from Cipro and Flagyl given during previous bout per her report.  She has an amoxicillin allergy and she does not want to take this medication as well.  This sounded like it was more of an intolerance and she does not report any history of anaphylaxis.  I recommended that we try Augmentin for treatment and patient refused.  Ultimately, she decided that she wanted to call and talk with her gastroenterologist tomorrow regarding treatment.  At one point I did raise the possibility of admission to  the hospital and she is adamant about not being admitted as well.  Most current vital signs reviewed and are as follows: BP 118/66   Pulse 74   Temp 99.9 F (37.7 C) (Oral)   Resp 16   Ht 5\' 7"  (1.702 m)   Wt 71.8 kg   SpO2 93%   BMI 24.79 kg/m   Plan: Discharge to home.   Prescriptions written for: Cipro and Flagyl, however patient does not want these antibiotics.  Other home care instructions discussed: Parke Simmers diet, maintain good hydration  ED return instructions discussed: The patient was urged to return to the Emergency Department immediately with worsening of current symptoms, worsening abdominal pain, persistent vomiting, blood noted in stools, fever, or any other concerns. The patient verbalized understanding.   Follow-up instructions discussed: Patient encouraged to follow-up with their PCP in 1 day.                               Medical Decision Making Amount and/or Complexity of Data Reviewed Labs: ordered. Radiology: ordered.  Risk Prescription drug management.   For this patient's complaint of abdominal pain, the following conditions were considered on the differential diagnosis: gastritis/PUD, enteritis/duodenitis, appendicitis, cholelithiasis/cholecystitis, cholangitis, pancreatitis, ruptured viscus, colitis, diverticulitis,  small/large bowel obstruction, proctitis, cystitis, pyelonephritis, ureteral colic, aortic dissection, aortic aneurysm. In women, ectopic pregnancy, pelvic inflammatory disease, ovarian cysts, and tubo-ovarian abscess were also considered. Atypical chest etiologies were also considered including ACS, PE, and pneumonia.  The patient's vital signs, pertinent lab work and imaging were reviewed and interpreted as discussed in the ED course. Hospitalization was considered for further testing, treatments, or serial exams/observation. However as patient is well-appearing, has a stable exam, and reassuring studies today, I do not feel that they warrant admission at this time. This plan was discussed with the patient who verbalizes agreement and comfort with this plan and seems reliable and able to return to the Emergency Department with worsening or changing symptoms.          Final Clinical Impression(s) / ED Diagnoses Final diagnoses:  Colitis  Lower GI bleeding    Rx / DC Orders ED Discharge Orders          Ordered    ciprofloxacin (CIPRO) 500 MG tablet  2 times daily        12/25/22 2241    metroNIDAZOLE (FLAGYL) 500 MG tablet  3 times daily        12/25/22 2241              Renne Crigler, PA-C 12/25/22 2333    Arby Barrette, MD 12/27/22 1337

## 2022-12-26 ENCOUNTER — Telehealth: Payer: Self-pay | Admitting: Physician Assistant

## 2022-12-26 NOTE — Telephone Encounter (Signed)
Inbound call from patient's friend Florentina Addison stating patient was in ED last night 7/9 for colitis. Requesting a call back regarding medication. Stated that the medication that was prescribed last night from [facility] sent to a pharmacy in [place] and patient has not way to get it. Patient would still like to start a medication to help. Would like to speak further about medication options. Please advise, thank you.

## 2022-12-26 NOTE — Telephone Encounter (Signed)
Patient is asking if she does need to take antibiotics for colitis.  She does not have any pain. She is having mucous in her stool, loud gurgling and passing stool with her flatus. Stools are soft unformed. She is not taking stool softeners. Diet has been adjusted to exclude high fiber foods. She states not much appetite and this is normal for her.  Patient was seen in the ER and declined antibiotics. She had labs and imaging. Thanks

## 2022-12-26 NOTE — Telephone Encounter (Signed)
DOD Patient of Dr Russella Dar last seen by Amy. Seen in the ER and is calling today with questions.Please see the message I have already put in from today.  I did not realize her providers are out of the office. Thanks

## 2022-12-27 NOTE — Telephone Encounter (Signed)
I spoke with the patient.  She has concerns about taking antibiotics prescribed by the ER for colitis vs diverticulitis.  Patient lives in an apartment at [place].  Patient has not been seen here at [facility] since 08/2020.  Patient reports that she continues to have symptoms of rectal bleeding and cramping, but "it is a little better".  Patient believes she may be allergic to Cipro that was prescribed and they were sent to a [facility].  She is asking for an alternate medication.  I advised her that she needs to discuss this with Dr. Donia Ast at [facility] last OV 12/25/22.  Patient is instructed to contact the nursing station at [facility] and they can assist her in contacting Dr. Chales Abrahams for advice. She is advised that they can assist her at [facility] to have the prescriptions transferred to a pharmacy she can access. Patient verbalized understanding to contact the nurse when we hang up.  Dr. Rhea Belton was DOD yesterday and was made aware of above conversation today.

## 2023-01-01 ENCOUNTER — Non-Acute Institutional Stay: Payer: Medicare Other | Admitting: Internal Medicine

## 2023-01-01 ENCOUNTER — Encounter: Payer: Self-pay | Admitting: Internal Medicine

## 2023-01-01 VITALS — BP 122/66 | HR 71 | Temp 97.8°F | Resp 17 | Ht 67.0 in | Wt 155.0 lb

## 2023-01-01 DIAGNOSIS — E039 Hypothyroidism, unspecified: Secondary | ICD-10-CM | POA: Diagnosis not present

## 2023-01-01 DIAGNOSIS — K529 Noninfective gastroenteritis and colitis, unspecified: Secondary | ICD-10-CM | POA: Diagnosis not present

## 2023-01-01 DIAGNOSIS — K625 Hemorrhage of anus and rectum: Secondary | ICD-10-CM

## 2023-01-01 DIAGNOSIS — I10 Essential (primary) hypertension: Secondary | ICD-10-CM

## 2023-01-03 NOTE — Progress Notes (Signed)
Location: [facility] features editor of Service:  Clinic (12)  Provider:   Code Status:  Goals of Care:     01/01/2023    2:18 PM  Advanced Directives  Does Patient Have a Medical Advance Directive? Yes  Type of Advance Directive Healthcare Power of Attorney  Does patient want to make changes to medical advance directive? No - Patient declined  Copy of Healthcare Power of Attorney in Chart? Yes - validated most recent copy scanned in chart (See row information)     Chief Complaint  Patient presents with   Follow-up    Patient states she is here for a follow up    HPI: Patient is a 89 y.o. female seen today for an acute visit for ED follow up for Colitis  Patient lives by herself in [place]   She was seen in the clinic for Rectal bleeding with Abdominal Pain Send to ED CT scan showed Mild wall thickening with pericolonic edema throughout the sigmoid colon and to a lesser extent the distal descending colon, suspicious for colitis She was started on Cipro and Flagyl She is here for follow up She feels better Is having normal stools Less blood No abdominal pain But feels Nauseated due to Antibiotics Also feel weak.and tired Is eating somewhat better now Has caregivers Walking with her walker and holding hands Low endurance   Past Medical History:  Diagnosis Date   Arthritis    Closed left femoral fracture (HCC) 2012   s/p ORIF, Dr. Ave Filter   Closed left radial fracture 2012   s/p reduction   Complication of anesthesia 08/14/2016   not able to think clear for a long time afterwards   Essential hypertension    Hyperlipidemia    Hypothyroidism     Past Surgical History:  Procedure Laterality Date   ABDOMINAL HYSTERECTOMY     CATARACT EXTRACTION W/ INTRAOCULAR LENS  IMPLANT, BILATERAL Bilateral 2014   COLONOSCOPY     ORIF FEMUR FRACTURE Left 04/08/2011   Dr. Ave Filter   TOTAL HIP ARTHROPLASTY Left 08/20/2016   Procedure: TOTAL HIP ARTHROPLASTY  POSTERIOR REMOVE TROCH NAIL;  Surgeon: Gean Birchwood, MD;  Location: [facility] OR;  Service: Orthopedics;  Laterality: Left;    Allergies  Allergen Reactions   Ambien [Zolpidem]    Amoxicillin     UNSPECIFIED REACTION   Has patient had a PCN reaction causing immediate rash, facial/tongue/throat swelling, SOB or lightheadedness with hypotension: Unknown Has patient had a PCN reaction causing severe rash involving mucus membranes or skin necrosis: Unknown Has patient had a PCN reaction that required hospitalization No Has patient had a PCN reaction occurring within the last 10 years: No If all of the above answers are "NO", then may proceed with Cephalosporin use.    Onion    Shellfish Allergy     Outpatient Encounter Medications as of 01/01/2023  Medication Sig   acetaminophen (TYLENOL) 325 MG tablet Take 650 mg by mouth 3 (three) times daily as needed.   Ascorbic Acid 125 MG CHEW Chew 1 tablet by mouth daily.   Cholecalciferol 50 MCG (2000 UT) CAPS Take 1 capsule (2,000 Units total) by mouth daily.   ciprofloxacin (CIPRO) 500 MG tablet Take 1 tablet (500 mg total) by mouth 2 (two) times daily.   hydrochlorothiazide (HYDRODIURIL) 25 MG tablet Take 0.5 tablets (12.5 mg total) by mouth daily.   influenza vaccine adjuvanted (FLUAD QUADRIVALENT) 0.5 ML injection Inject into the muscle.   levothyroxine (SYNTHROID) 25 MCG  tablet Take 1 tablet (25 mcg total) by mouth every morning.   lovastatin (MEVACOR) 20 MG tablet TAKE 1 TABLET BY MOUTH EVERY MORNING   Lutein-Zeaxanthin 25-5 MG CAPS Take 1 capsule by mouth daily at 6 (six) AM.   metroNIDAZOLE (FLAGYL) 500 MG tablet Take 1 tablet (500 mg total) by mouth 3 (three) times daily.   polyethylene glycol (MIRALAX / GLYCOLAX) 17 g packet Take 17 g by mouth daily as needed.   No facility-administered encounter medications on file as of 01/01/2023.    Review of Systems:  Review of Systems  Constitutional:  Positive for activity change and appetite change.   HENT: Negative.    Respiratory:  Negative for cough and shortness of breath.   Cardiovascular:  Negative for leg swelling.  Gastrointestinal:  Positive for blood in stool. Negative for constipation.  Genitourinary: Negative.   Musculoskeletal:  Positive for gait problem. Negative for arthralgias and myalgias.  Skin: Negative.   Neurological:  Positive for weakness. Negative for dizziness.  Psychiatric/Behavioral:  Positive for confusion. Negative for dysphoric mood and sleep disturbance.     Health Maintenance  Topic Date Due   DTaP/Tdap/Td (1 - Tdap) Never done   Pneumonia Vaccine 17+ Years old (1 of 1 - PCV) 06/23/1995   Medicare Annual Wellness (AWV)  04/28/2021   INFLUENZA VACCINE  01/17/2023   COVID-19 Vaccine (8 - 2023-24 season) 02/10/2023   DEXA SCAN  Completed   Zoster Vaccines- Shingrix  Completed   HPV VACCINES  Aged Out    Physical Exam: Vitals:   01/01/23 1415  BP: 122/66  Pulse: 71  Resp: 17  Temp: 97.8 F (36.6 C)  TempSrc: Temporal  SpO2: 95%  Weight: 155 lb (70.3 kg)  Height: 5\' 7"  (1.702 m)   Body mass index is 24.28 kg/m. Physical Exam Vitals reviewed.  Constitutional:      Appearance: Normal appearance.  HENT:     Head: Normocephalic.     Nose: Nose normal.     Mouth/Throat:     Mouth: Mucous membranes are moist.     Pharynx: Oropharynx is clear.  Eyes:     Pupils: Pupils are equal, round, and reactive to light.  Cardiovascular:     Rate and Rhythm: Normal rate and regular rhythm.     Pulses: Normal pulses.     Heart sounds: Normal heart sounds. No murmur heard. Pulmonary:     Effort: Pulmonary effort is normal.     Breath sounds: Normal breath sounds.  Abdominal:     General: Abdomen is flat. Bowel sounds are normal.     Palpations: Abdomen is soft.  Musculoskeletal:        General: No swelling.     Cervical back: Neck supple.  Skin:    General: Skin is warm.  Neurological:     General: No focal deficit present.     Mental  Status: She is alert and oriented to person, place, and time.  Psychiatric:        Mood and Affect: Mood normal.        Thought Content: Thought content normal.     Labs reviewed: Basic Metabolic Panel: Recent Labs    06/21/22 0000 12/25/22 1828  NA 141 136  K 3.8 3.0*  CL 103 100  CO2 24* 26  GLUCOSE  --  113*  BUN 19 19  CREATININE 0.8 0.81  CALCIUM 9.3 9.9  TSH 3.76  --    Liver Function Tests: Recent Labs  06/21/22 0000 12/25/22 1828  AST 22 15  ALT 9 9  ALKPHOS 104 105  BILITOT  --  0.6  PROT  --  7.1  ALBUMIN  --  4.1   No results for input(s): "LIPASE", "AMYLASE" in the last 8760 hours. No results for input(s): "AMMONIA" in the last 8760 hours. CBC: Recent Labs    06/21/22 0000 12/25/22 1828  WBC 5.0 7.2  HGB 12.3 11.8*  HCT 37 35.5*  MCV  --  92.4  PLT 213 257   Lipid Panel: Recent Labs    06/21/22 0000  CHOL 169  HDL 76*  LDLCALC 80  TRIG 67   Lab Results  Component Value Date   HGBA1C 5.7 (H) 04/10/2011    Procedures since last visit: CT ABDOMEN PELVIS W CONTRAST  Result Date: 12/25/2022 CLINICAL DATA:  Abdominal pain and rectal bleeding. EXAM: CT ABDOMEN AND PELVIS WITH CONTRAST TECHNIQUE: Multidetector CT imaging of the abdomen and pelvis was performed using the standard protocol following bolus administration of intravenous contrast. RADIATION DOSE REDUCTION: This exam was performed according to the departmental dose-optimization program which includes automated exposure control, adjustment of the mA and/or kV according to patient size and/or use of iterative reconstruction technique. CONTRAST:  80mL OMNIPAQUE IOHEXOL 300 MG/ML  SOLN COMPARISON:  07/29/2020 FINDINGS: Lower chest: No basilar airspace disease or pleural effusion. Hepatobiliary: Tiny cyst in the subcapsular right lobe of the liver. No suspicious liver lesion. Layering gallstones within physiologically distended gallbladder. No biliary dilatation. Pancreas: 7 mm cyst in the  pancreatic tail, series 3, image 26. No ductal dilatation or inflammation. Spleen: Normal in size without focal abnormality. Adrenals/Urinary Tract: Normal adrenal glands. No hydronephrosis. There are bilateral parapelvic cysts. 9 cm cortical cyst arises from the upper pole of the right kidney. No specific imaging follow-up is recommended. No evidence of solid lesion. The urinary bladder is partially distended, normal for degree of distension. Stomach/Bowel: There is mild wall thickening with pericolonic edema throughout the sigmoid colon, for example series 3 images 57 and 58. Minimal wall thickening is also seen involving the distal descending colon, for example series 3, image 39. multiple colonic diverticula, although length of colonic involvement favors colitis over diverticulitis. There is moderate colonic stool burden proximally. Normal appendix potentially visualized. The stomach is nondistended. There is no small bowel inflammation. Vascular/Lymphatic: Aortic atherosclerosis without aneurysm. 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 Distal branches are patent. The portal vein is patent. Increased number of lymph nodes adjacent to the inferior mesenteric vessels as well as adjacent to inflamed sigmoid colon, likely reactive. Reproductive: Status post hysterectomy. No adnexal masses. Other: No free air or perforation. No significant ascites. No focal fluid collection. No abdominal wall hernia. Musculoskeletal: Left hip arthroplasty. Scoliosis and degenerative change in the spine. IMPRESSION: 1. Mild wall thickening with pericolonic edema throughout the sigmoid colon and to a lesser extent the distal descending colon, suspicious for colitis. There are multiple colonic diverticula, although length of colonic involvement favors colitis over diverticulitis. 2. Increased number of lymph nodes adjacent to the inferior mesenteric vessels as well as  adjacent to inflamed sigmoid colon, likely reactive. 3. Cholelithiasis without CT findings of acute cholecystitis. 4. A 7 mm cyst in the pancreatic tail. Per consensus guidelines call in a patient of this age for a cyst of this size, the decision to pursue imaging follow-up should be anchored to the patient's overall health and preferences, such workup  is only advised if the patient is a surgical candidate. Follow-up imaging in 2 years can be considered. Aortic Atherosclerosis (ICD10-I70.0). Electronically Signed   By: Narda Rutherford M.D.   On: 12/25/2022 21:14    Assessment/Plan 1. Colitis Doing well on Cipro and Flagyl Continue for now Continue drinking water Will follow in 4 weeks  2. Rectal bleeding Mostly resolved per patient HGB was stable in ED  3. Primary hypertension On hydrochlorothiazide a  4. Acquired hypothyroidism TSH normal in 06/2022    Labs/tests ordered:  * No order type specified * Next appt:  01/22/2023

## 2023-01-07 ENCOUNTER — Non-Acute Institutional Stay (SKILLED_NURSING_FACILITY): Payer: Medicare Other | Admitting: Internal Medicine

## 2023-01-07 ENCOUNTER — Encounter: Payer: Self-pay | Admitting: Internal Medicine

## 2023-01-07 DIAGNOSIS — E7800 Pure hypercholesterolemia, unspecified: Secondary | ICD-10-CM | POA: Diagnosis not present

## 2023-01-07 DIAGNOSIS — I10 Essential (primary) hypertension: Secondary | ICD-10-CM | POA: Diagnosis not present

## 2023-01-07 DIAGNOSIS — E039 Hypothyroidism, unspecified: Secondary | ICD-10-CM

## 2023-01-07 DIAGNOSIS — K625 Hemorrhage of anus and rectum: Secondary | ICD-10-CM

## 2023-01-07 DIAGNOSIS — G3184 Mild cognitive impairment, so stated: Secondary | ICD-10-CM

## 2023-01-07 DIAGNOSIS — R110 Nausea: Secondary | ICD-10-CM | POA: Diagnosis not present

## 2023-01-07 DIAGNOSIS — K529 Noninfective gastroenteritis and colitis, unspecified: Secondary | ICD-10-CM | POA: Diagnosis not present

## 2023-01-07 DIAGNOSIS — R531 Weakness: Secondary | ICD-10-CM

## 2023-01-07 LAB — BASIC METABOLIC PANEL
BUN: 16 (ref 4–21)
CO2: 26 — AB (ref 13–22)
Chloride: 96 — AB (ref 99–108)
Creatinine: 0.9 (ref 0.5–1.1)
Glucose: 108
Potassium: 3.4 mEq/L — AB (ref 3.5–5.1)
Sodium: 134 — AB (ref 137–147)

## 2023-01-07 LAB — COMPREHENSIVE METABOLIC PANEL
Albumin: 3.2 — AB (ref 3.5–5.0)
Calcium: 8.4 — AB (ref 8.7–10.7)
eGFR: 59

## 2023-01-07 LAB — CBC AND DIFFERENTIAL
HCT: 36 (ref 36–46)
Hemoglobin: 12.2 (ref 12.0–16.0)
Neutrophils Absolute: 6.6
Platelets: 406 10*3/uL — AB (ref 150–400)
WBC: 9.2

## 2023-01-07 LAB — HEPATIC FUNCTION PANEL
Alkaline Phosphatase: 82 (ref 25–125)
Bilirubin, Total: 0.6

## 2023-01-07 LAB — CBC: RBC: 3.89 (ref 3.87–5.11)

## 2023-01-07 NOTE — Progress Notes (Unsigned)
Provider:   Location:  Medical illustrator of Service:  SNF (31)  PCP: Mahlon Gammon, MD Patient Care Team: Mahlon Gammon, MD as PCP - General (Internal Medicine) Elise Benne, MD as Consulting Physician (Ophthalmology) Jones Broom, MD as Consulting Physician (Orthopedic Surgery)  Extended Emergency Contact Information Primary Emergency Contact: Daisy Floro of [place] Home Phone: [phone] Relation: Daughter  Code Status: Full Code Goals of Care: Advanced Directive information    01/01/2023    2:18 PM  Advanced Directives  Does Patient Have a Medical Advance Directive? Yes  Type of Advance Directive Healthcare Power of Attorney  Does patient want to make changes to medical advance directive? No - Patient declined  Copy of Healthcare Power of Attorney in Chart? Yes - validated most recent copy scanned in chart (See row information)      Chief Complaint  Patient presents with   New Admit To SNF    HPI: Patient is a 89 y.o. female seen today for admission to Rehab  Patient lives by herself in [place]    She was seen in the clinic for Rectal bleeding with Abdominal Pain on 12/25/22 Send to ED CT scan showed Mild wall thickening with pericolonic edema throughout the sigmoid colon and to a lesser extent the distal descending colon, suspicious for colitis She was started on Cipro and Flagyl  Patient finsihed her Antibiotics but continues to feel Nauseated weak not eating and drinking  Feels like she cannot do her ADLS and need help Still having some rectal bleeding No Diarrhea Denies abdominal pain  Weight down 4 lbs Patient refusing to go to ED for repeat CT scan right now Past Medical History:  Diagnosis Date   Arthritis    Closed left femoral fracture (HCC) 2012   s/p ORIF, Dr. Ave Filter   Closed left radial fracture 2012   s/p reduction   Complication of anesthesia 08/14/2016   not able to think clear for a  long time afterwards   Essential hypertension    Hyperlipidemia    Hypothyroidism    Past Surgical History:  Procedure Laterality Date   ABDOMINAL HYSTERECTOMY     CATARACT EXTRACTION W/ INTRAOCULAR LENS  IMPLANT, BILATERAL Bilateral 2014   COLONOSCOPY     ORIF FEMUR FRACTURE Left 04/08/2011   Dr. Ave Filter   TOTAL HIP ARTHROPLASTY Left 08/20/2016   Procedure: TOTAL HIP ARTHROPLASTY POSTERIOR REMOVE TROCH NAIL;  Surgeon: Gean Birchwood, MD;  Location: [facility] OR;  Service: Orthopedics;  Laterality: Left;    reports that she has never smoked. She has never used smokeless tobacco. She reports that she does not drink alcohol and does not use drugs. Social History   Socioeconomic History   Marital status: Widowed    Spouse name: Not on file   Number of children: Not on file   Years of education: Not on file   Highest education level: Not on file  Occupational History   Not on file  Tobacco Use   Smoking status: Never   Smokeless tobacco: Never  Vaping Use   Vaping status: Never Used  Substance and Sexual Activity   Alcohol use: No   Drug use: No   Sexual activity: Never  Other Topics Concern   Not on file  Social History Narrative   Tobacco use, amount per day now: NONE   Past tobacco use, amount per day: NONE   How many years did you use tobacco: NONE   Alcohol use (drinks  per week): 1   Diet: ONGOING   Do you drink/eat things with caffeine: YES   Marital status: WIDOWED                       What year were you married? 1956   Do you live in a house, apartment, assisted living, condo, trailer, etc.? INDEPENDENT LIVING ([facility])   Is it one or more stories? ONE   How many persons live in your home? ONE   Do you have pets in your home?( please list) 3 DOGS   Current or past profession: PUBLISHER   Do you exercise?   YES                               Type and how often? SWIM DAILY   Do you have a living will? YES   Do you have a DNR form?   YES                                If  not, do you want to discuss one?   Do you have signed POA/HPOA forms?  YES                      If so, please bring to you appointment   Has 2 adopted children   Social Determinants of Health   Financial Resource Strain: Not on file  Food Insecurity: Not on file  Transportation Needs: Not on file  Physical Activity: Not on file  Stress: Not on file  Social Connections: Not on file  Intimate Partner Violence: Not on file    Functional Status Survey:    Family History  Problem Relation Age of Onset   Colon cancer Mother    Heart disease Father    Esophageal cancer Neg Hx    Stomach cancer Neg Hx    Pancreatic cancer Neg Hx     Health Maintenance  Topic Date Due   DTaP/Tdap/Td (1 - Tdap) Never done   Pneumonia Vaccine 36+ Years old (1 of 1 - PCV) 06/23/1995   Medicare Annual Wellness (AWV)  04/28/2021   INFLUENZA VACCINE  01/17/2023   COVID-19 Vaccine (8 - 2023-24 season) 02/10/2023   DEXA SCAN  Completed   Zoster Vaccines- Shingrix  Completed   HPV VACCINES  Aged Out    Allergies  Allergen Reactions   Ambien [Zolpidem]    Amoxicillin     UNSPECIFIED REACTION   Has patient had a PCN reaction causing immediate rash, facial/tongue/throat swelling, SOB or lightheadedness with hypotension: Unknown Has patient had a PCN reaction causing severe rash involving mucus membranes or skin necrosis: Unknown Has patient had a PCN reaction that required hospitalization No Has patient had a PCN reaction occurring within the last 10 years: No If all of the above answers are "NO", then may proceed with Cephalosporin use.    Onion    Shellfish Allergy     Outpatient Encounter Medications as of 01/07/2023  Medication Sig   acetaminophen (TYLENOL) 325 MG tablet Take 650 mg by mouth 3 (three) times daily as needed.   Ascorbic Acid 125 MG CHEW Chew 1 tablet by mouth daily.   Cholecalciferol 50 MCG (2000 UT) CAPS Take 1 capsule (2,000 Units total) by mouth daily.   hydrochlorothiazide  (HYDRODIURIL) 25 MG tablet Take 0.5 tablets (  12.5 mg total) by mouth daily.   levothyroxine (SYNTHROID) 25 MCG tablet Take 1 tablet (25 mcg total) by mouth every morning.   lovastatin (MEVACOR) 20 MG tablet TAKE 1 TABLET BY MOUTH EVERY MORNING   Lutein-Zeaxanthin 25-5 MG CAPS Take 1 capsule by mouth daily at 6 (six) AM.   polyethylene glycol (MIRALAX / GLYCOLAX) 17 g packet Take 17 g by mouth daily as needed.   [DISCONTINUED] ciprofloxacin (CIPRO) 500 MG tablet Take 1 tablet (500 mg total) by mouth 2 (two) times daily.   [DISCONTINUED] influenza vaccine adjuvanted (FLUAD QUADRIVALENT) 0.5 ML injection Inject into the muscle.   [DISCONTINUED] metroNIDAZOLE (FLAGYL) 500 MG tablet Take 1 tablet (500 mg total) by mouth 3 (three) times daily.   No facility-administered encounter medications on file as of 01/07/2023.    Review of Systems  Constitutional:  Positive for activity change and appetite change.  HENT: Negative.    Respiratory:  Negative for cough and shortness of breath.   Cardiovascular:  Negative for leg swelling.  Gastrointestinal:  Positive for nausea. Negative for constipation.  Genitourinary: Negative.   Musculoskeletal:  Positive for gait problem. Negative for arthralgias and myalgias.  Skin: Negative.   Neurological:  Positive for weakness. Negative for dizziness.  Psychiatric/Behavioral:  Negative for confusion, dysphoric mood and sleep disturbance.     Vitals:   01/07/23 2037  BP: (!) 91/55  Pulse: 77  Resp: 12  Temp: 98.7 F (37.1 C)  SpO2: 96%  Weight: 151 lb (68.5 kg)   Body mass index is 23.65 kg/m. Physical Exam Vitals reviewed.  Constitutional:      Appearance: Normal appearance.  HENT:     Head: Normocephalic.     Nose: Nose normal.     Mouth/Throat:     Mouth: Mucous membranes are moist.     Pharynx: Oropharynx is clear.  Eyes:     Pupils: Pupils are equal, round, and reactive to light.  Cardiovascular:     Rate and Rhythm: Normal rate. Rhythm  irregular.     Pulses: Normal pulses.     Heart sounds: Normal heart sounds. No murmur heard. Pulmonary:     Effort: Pulmonary effort is normal.     Breath sounds: Normal breath sounds.  Abdominal:     General: Abdomen is flat.     Palpations: Abdomen is soft.     Comments: No Distension Not tender BS decrased  Musculoskeletal:        General: No swelling.     Cervical back: Neck supple.  Skin:    General: Skin is warm.  Neurological:     General: No focal deficit present.     Mental Status: She is alert and oriented to person, place, and time.  Psychiatric:        Mood and Affect: Mood normal.        Thought Content: Thought content normal.     Labs reviewed: Basic Metabolic Panel: Recent Labs    06/21/22 0000 12/25/22 1828  NA 141 136  K 3.8 3.0*  CL 103 100  CO2 24* 26  GLUCOSE  --  113*  BUN 19 19  CREATININE 0.8 0.81  CALCIUM 9.3 9.9   Liver Function Tests: Recent Labs    06/21/22 0000 12/25/22 1828  AST 22 15  ALT 9 9  ALKPHOS 104 105  BILITOT  --  0.6  PROT  --  7.1  ALBUMIN  --  4.1   No results for input(s): "LIPASE", "AMYLASE"  in the last 8760 hours. No results for input(s): "AMMONIA" in the last 8760 hours. CBC: Recent Labs    06/21/22 0000 12/25/22 1828  WBC 5.0 7.2  HGB 12.3 11.8*  HCT 37 35.5*  MCV  --  92.4  PLT 213 257   Cardiac Enzymes: No results for input(s): "CKTOTAL", "CKMB", "CKMBINDEX", "TROPONINI" in the last 8760 hours. BNP: Invalid input(s): "POCBNP" Lab Results  Component Value Date   HGBA1C 5.7 (H) 04/10/2011   Lab Results  Component Value Date   TSH 3.76 06/21/2022   No results found for: "VITAMINB12" No results found for: "FOLATE" No results found for: "IRON", "TIBC", "FERRITIN"  Imaging and Procedures obtained prior to SNF admission: CT ABDOMEN PELVIS W CONTRAST  Result Date: 12/25/2022 CLINICAL DATA:  Abdominal pain and rectal bleeding. EXAM: CT ABDOMEN AND PELVIS WITH CONTRAST TECHNIQUE: Multidetector  CT imaging of the abdomen and pelvis was performed using the standard protocol following bolus administration of intravenous contrast. RADIATION DOSE REDUCTION: This exam was performed according to the departmental dose-optimization program which includes automated exposure control, adjustment of the mA and/or kV according to patient size and/or use of iterative reconstruction technique. CONTRAST:  80mL OMNIPAQUE IOHEXOL 300 MG/ML  SOLN COMPARISON:  07/29/2020 FINDINGS: Lower chest: No basilar airspace disease or pleural effusion. Hepatobiliary: Tiny cyst in the subcapsular right lobe of the liver. No suspicious liver lesion. Layering gallstones within physiologically distended gallbladder. No biliary dilatation. Pancreas: 7 mm cyst in the pancreatic tail, series 3, image 26. No ductal dilatation or inflammation. Spleen: Normal in size without focal abnormality. Adrenals/Urinary Tract: Normal adrenal glands. No hydronephrosis. There are bilateral parapelvic cysts. 9 cm cortical cyst arises from the upper pole of the right kidney. No specific imaging follow-up is recommended. No evidence of solid lesion. The urinary bladder is partially distended, normal for degree of distension. Stomach/Bowel: There is mild wall thickening with pericolonic edema throughout the sigmoid colon, for example series 3 images 57 and 58. Minimal wall thickening is also seen involving the distal descending colon, for example series 3, image 39. multiple colonic diverticula, although length of colonic involvement favors colitis over diverticulitis. There is moderate colonic stool burden proximally. Normal appendix potentially visualized. The stomach is nondistended. There is no small bowel inflammation. Vascular/Lymphatic: Aortic atherosclerosis without aneurysm. 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 Distal branches are patent. The portal vein is patent. Increased number of lymph  nodes adjacent to the inferior mesenteric vessels as well as adjacent to inflamed sigmoid colon, likely reactive. Reproductive: Status post hysterectomy. No adnexal masses. Other: No free air or perforation. No significant ascites. No focal fluid collection. No abdominal wall hernia. Musculoskeletal: Left hip arthroplasty. Scoliosis and degenerative change in the spine. IMPRESSION: 1. Mild wall thickening with pericolonic edema throughout the sigmoid colon and to a lesser extent the distal descending colon, suspicious for colitis. There are multiple colonic diverticula, although length of colonic involvement favors colitis over diverticulitis. 2. Increased number of lymph nodes adjacent to the inferior mesenteric vessels as well as adjacent to inflamed sigmoid colon, likely reactive. 3. Cholelithiasis without CT findings of acute cholecystitis. 4. A 7 mm cyst in the pancreatic tail. Per consensus guidelines call in a patient of this age for a cyst of this size, the decision to pursue imaging follow-up should be anchored to the patient's overall health and preferences, such workup is only advised if the patient is a surgical candidate. Follow-up imaging  in 2 years can be considered. Aortic Atherosclerosis (ICD10-I70.0). Electronically Signed   By: Narda Rutherford M.D.   On: 12/25/2022 21:14    Assessment/Plan 1. Colitis Finished Antibiotics Still having some bleeding Repeat CBC and CMP  2. Weakness Can be due to poor intake CBC,CMP Hold HCTZ 3. Nausea Zofran Prilosec Hold all her PO  meds Right now  4. Primary hypertension BP low Hold HCTZ  5. Acquired hypothyroidism Continue Levothyroxine  6. Rectal bleeding Repeat CBC  7. Pure hypercholesterolemia Hold Statin  8. MCI (mild cognitive impairment) Will Monitor in SNF 9 Irregular HR EKG showed some Premature Atrial Contractions  D/W daughter in [place]   Family/ staff Communication:   Labs/tests ordered: Stat CBC,CMP

## 2023-01-09 ENCOUNTER — Encounter (HOSPITAL_COMMUNITY): Payer: Self-pay

## 2023-01-09 ENCOUNTER — Non-Acute Institutional Stay (SKILLED_NURSING_FACILITY): Payer: Medicare Other | Admitting: Orthopedic Surgery

## 2023-01-09 ENCOUNTER — Emergency Department (HOSPITAL_COMMUNITY): Payer: Medicare Other

## 2023-01-09 ENCOUNTER — Other Ambulatory Visit: Payer: Self-pay

## 2023-01-09 ENCOUNTER — Inpatient Hospital Stay (HOSPITAL_COMMUNITY)
Admission: EM | Admit: 2023-01-09 | Discharge: 2023-01-14 | DRG: 391 | Disposition: A | Discharge status: Skilled Nursing Facility | Payer: Medicare Other | Attending: Internal Medicine | Admitting: Internal Medicine

## 2023-01-09 ENCOUNTER — Encounter: Payer: Self-pay | Admitting: Orthopedic Surgery

## 2023-01-09 DIAGNOSIS — R197 Diarrhea, unspecified: Secondary | ICD-10-CM | POA: Diagnosis not present

## 2023-01-09 DIAGNOSIS — Z79899 Other long term (current) drug therapy: Secondary | ICD-10-CM | POA: Diagnosis not present

## 2023-01-09 DIAGNOSIS — Z7901 Long term (current) use of anticoagulants: Secondary | ICD-10-CM

## 2023-01-09 DIAGNOSIS — Z96642 Presence of left artificial hip joint: Secondary | ICD-10-CM | POA: Diagnosis not present

## 2023-01-09 DIAGNOSIS — K625 Hemorrhage of anus and rectum: Secondary | ICD-10-CM | POA: Diagnosis not present

## 2023-01-09 DIAGNOSIS — K559 Vascular disorder of intestine, unspecified: Secondary | ICD-10-CM | POA: Diagnosis not present

## 2023-01-09 DIAGNOSIS — R634 Abnormal weight loss: Secondary | ICD-10-CM | POA: Diagnosis not present

## 2023-01-09 DIAGNOSIS — Z711 Person with feared health complaint in whom no diagnosis is made: Secondary | ICD-10-CM

## 2023-01-09 DIAGNOSIS — Z7189 Other specified counseling: Secondary | ICD-10-CM | POA: Diagnosis not present

## 2023-01-09 DIAGNOSIS — Z87891 Personal history of nicotine dependence: Secondary | ICD-10-CM | POA: Diagnosis not present

## 2023-01-09 DIAGNOSIS — K8020 Calculus of gallbladder without cholecystitis without obstruction: Secondary | ICD-10-CM | POA: Diagnosis not present

## 2023-01-09 DIAGNOSIS — Z880 Allergy status to penicillin: Secondary | ICD-10-CM | POA: Diagnosis not present

## 2023-01-09 DIAGNOSIS — K529 Noninfective gastroenteritis and colitis, unspecified: Secondary | ICD-10-CM | POA: Diagnosis not present

## 2023-01-09 DIAGNOSIS — K922 Gastrointestinal hemorrhage, unspecified: Secondary | ICD-10-CM | POA: Diagnosis not present

## 2023-01-09 DIAGNOSIS — K551 Chronic vascular disorders of intestine: Secondary | ICD-10-CM | POA: Diagnosis present

## 2023-01-09 DIAGNOSIS — Z79890 Hormone replacement therapy (postmenopausal): Secondary | ICD-10-CM

## 2023-01-09 DIAGNOSIS — R531 Weakness: Secondary | ICD-10-CM | POA: Diagnosis not present

## 2023-01-09 DIAGNOSIS — R52 Pain, unspecified: Secondary | ICD-10-CM | POA: Diagnosis not present

## 2023-01-09 DIAGNOSIS — R110 Nausea: Secondary | ICD-10-CM

## 2023-01-09 DIAGNOSIS — K55069 Acute infarction of intestine, part and extent unspecified: Secondary | ICD-10-CM | POA: Diagnosis not present

## 2023-01-09 DIAGNOSIS — Z91018 Allergy to other foods: Secondary | ICD-10-CM

## 2023-01-09 DIAGNOSIS — Z800 Family history of malignant neoplasm of digestive organs: Secondary | ICD-10-CM

## 2023-01-09 DIAGNOSIS — R4589 Other symptoms and signs involving emotional state: Secondary | ICD-10-CM | POA: Diagnosis not present

## 2023-01-09 DIAGNOSIS — I700 Atherosclerosis of aorta: Secondary | ICD-10-CM | POA: Diagnosis not present

## 2023-01-09 DIAGNOSIS — Z8249 Family history of ischemic heart disease and other diseases of the circulatory system: Secondary | ICD-10-CM

## 2023-01-09 DIAGNOSIS — Z515 Encounter for palliative care: Secondary | ICD-10-CM | POA: Diagnosis not present

## 2023-01-09 DIAGNOSIS — R1084 Generalized abdominal pain: Secondary | ICD-10-CM | POA: Diagnosis not present

## 2023-01-09 DIAGNOSIS — A09 Infectious gastroenteritis and colitis, unspecified: Principal | ICD-10-CM | POA: Diagnosis present

## 2023-01-09 DIAGNOSIS — R1310 Dysphagia, unspecified: Secondary | ICD-10-CM | POA: Diagnosis not present

## 2023-01-09 DIAGNOSIS — R918 Other nonspecific abnormal finding of lung field: Secondary | ICD-10-CM | POA: Diagnosis not present

## 2023-01-09 DIAGNOSIS — E876 Hypokalemia: Secondary | ICD-10-CM | POA: Diagnosis present

## 2023-01-09 DIAGNOSIS — D62 Acute posthemorrhagic anemia: Secondary | ICD-10-CM | POA: Diagnosis present

## 2023-01-09 DIAGNOSIS — B370 Candidal stomatitis: Secondary | ICD-10-CM | POA: Diagnosis not present

## 2023-01-09 DIAGNOSIS — E7800 Pure hypercholesterolemia, unspecified: Secondary | ICD-10-CM | POA: Diagnosis present

## 2023-01-09 DIAGNOSIS — R933 Abnormal findings on diagnostic imaging of other parts of digestive tract: Secondary | ICD-10-CM

## 2023-01-09 DIAGNOSIS — E039 Hypothyroidism, unspecified: Secondary | ICD-10-CM | POA: Diagnosis not present

## 2023-01-09 DIAGNOSIS — R935 Abnormal findings on diagnostic imaging of other abdominal regions, including retroperitoneum: Secondary | ICD-10-CM | POA: Diagnosis not present

## 2023-01-09 DIAGNOSIS — Z91013 Allergy to seafood: Secondary | ICD-10-CM

## 2023-01-09 DIAGNOSIS — Z888 Allergy status to other drugs, medicaments and biological substances status: Secondary | ICD-10-CM | POA: Diagnosis not present

## 2023-01-09 DIAGNOSIS — K55059 Acute (reversible) ischemia of intestine, part and extent unspecified: Secondary | ICD-10-CM | POA: Diagnosis present

## 2023-01-09 DIAGNOSIS — Z66 Do not resuscitate: Secondary | ICD-10-CM | POA: Diagnosis not present

## 2023-01-09 DIAGNOSIS — F0390 Unspecified dementia without behavioral disturbance: Secondary | ICD-10-CM | POA: Diagnosis present

## 2023-01-09 DIAGNOSIS — N281 Cyst of kidney, acquired: Secondary | ICD-10-CM | POA: Diagnosis not present

## 2023-01-09 DIAGNOSIS — Z7401 Bed confinement status: Secondary | ICD-10-CM | POA: Diagnosis not present

## 2023-01-09 DIAGNOSIS — Q272 Other congenital malformations of renal artery: Secondary | ICD-10-CM | POA: Diagnosis not present

## 2023-01-09 DIAGNOSIS — K5100 Ulcerative (chronic) pancolitis without complications: Secondary | ICD-10-CM | POA: Diagnosis not present

## 2023-01-09 DIAGNOSIS — I10 Essential (primary) hypertension: Secondary | ICD-10-CM | POA: Diagnosis present

## 2023-01-09 DIAGNOSIS — D500 Iron deficiency anemia secondary to blood loss (chronic): Secondary | ICD-10-CM | POA: Diagnosis not present

## 2023-01-09 LAB — CBC WITH DIFFERENTIAL/PLATELET
Abs Immature Granulocytes: 0.32 10*3/uL — ABNORMAL HIGH (ref 0.00–0.07)
Basophils Absolute: 0 10*3/uL (ref 0.0–0.1)
Basophils Relative: 0 %
Eosinophils Absolute: 0 10*3/uL (ref 0.0–0.5)
Eosinophils Relative: 0 %
HCT: 40.3 % (ref 36.0–46.0)
Hemoglobin: 12.9 g/dL (ref 12.0–15.0)
Immature Granulocytes: 3 %
Lymphocytes Relative: 9 %
Lymphs Abs: 1 10*3/uL (ref 0.7–4.0)
MCH: 30.4 pg (ref 26.0–34.0)
MCHC: 32 g/dL (ref 30.0–36.0)
MCV: 94.8 fL (ref 80.0–100.0)
Monocytes Absolute: 0.9 10*3/uL (ref 0.1–1.0)
Monocytes Relative: 8 %
Neutro Abs: 9.6 10*3/uL — ABNORMAL HIGH (ref 1.7–7.7)
Neutrophils Relative %: 80 %
Platelets: 196 10*3/uL (ref 150–400)
RBC: 4.25 MIL/uL (ref 3.87–5.11)
RDW: 13.8 % (ref 11.5–15.5)
WBC: 11.9 10*3/uL — ABNORMAL HIGH (ref 4.0–10.5)
nRBC: 0 % (ref 0.0–0.2)

## 2023-01-09 LAB — URINALYSIS, ROUTINE W REFLEX MICROSCOPIC
Bilirubin Urine: NEGATIVE
Glucose, UA: NEGATIVE mg/dL
Ketones, ur: NEGATIVE mg/dL
Leukocytes,Ua: NEGATIVE
Nitrite: NEGATIVE
Protein, ur: 30 mg/dL — AB
Specific Gravity, Urine: 1.035 — ABNORMAL HIGH (ref 1.005–1.030)
pH: 6 (ref 5.0–8.0)

## 2023-01-09 LAB — COMPREHENSIVE METABOLIC PANEL
ALT: 12 U/L (ref 0–44)
AST: 20 U/L (ref 15–41)
Albumin: 2.8 g/dL — ABNORMAL LOW (ref 3.5–5.0)
Alkaline Phosphatase: 104 U/L (ref 38–126)
Anion gap: 13 (ref 5–15)
BUN: 13 mg/dL (ref 8–23)
CO2: 25 mmol/L (ref 22–32)
Calcium: 8.6 mg/dL — ABNORMAL LOW (ref 8.9–10.3)
Chloride: 96 mmol/L — ABNORMAL LOW (ref 98–111)
Creatinine, Ser: 0.66 mg/dL (ref 0.44–1.00)
GFR, Estimated: >60 mL/min (ref >60)
Glucose, Bld: 125 mg/dL — ABNORMAL HIGH (ref 70–99)
Potassium: 3.1 mmol/L — ABNORMAL LOW (ref 3.5–5.1)
Sodium: 134 mmol/L — ABNORMAL LOW (ref 135–145)
Total Bilirubin: 0.8 mg/dL (ref 0.3–1.2)
Total Protein: 6.8 g/dL (ref 6.5–8.1)

## 2023-01-09 LAB — MAGNESIUM: Magnesium: 1.9 mg/dL (ref 1.7–2.4)

## 2023-01-09 LAB — POC OCCULT BLOOD, ED: Fecal Occult Bld: POSITIVE — AB

## 2023-01-09 LAB — LIPASE, BLOOD: Lipase: 22 U/L (ref 11–51)

## 2023-01-09 MED ORDER — LEVOTHYROXINE SODIUM 25 MCG PO TABS
25.0000 ug | ORAL_TABLET | Freq: Every day | ORAL | Status: DC
  Administered 2023-01-10 – 2023-01-14 (×2): 25 ug via ORAL
  Filled 2023-01-09 (×4): qty 1

## 2023-01-09 MED ORDER — LACTATED RINGERS IV SOLN: INTRAVENOUS | Status: DC

## 2023-01-09 MED ORDER — IOHEXOL 300 MG/ML  SOLN
100.0000 mL | Freq: Once | INTRAMUSCULAR | Status: AC | PRN
  Administered 2023-01-09: 100 mL via INTRAVENOUS

## 2023-01-09 MED ORDER — ACETAMINOPHEN 325 MG PO TABS: 650.0000 mg | ORAL_TABLET | Freq: Four times a day (QID) | ORAL | Status: DC | PRN

## 2023-01-09 MED ORDER — SODIUM CHLORIDE 0.9 % IV SOLN
2.0000 g | Freq: Once | INTRAVENOUS | Status: AC
  Administered 2023-01-09: 2 g via INTRAVENOUS
  Filled 2023-01-09: qty 20

## 2023-01-09 MED ORDER — HEPARIN (PORCINE) 25000 UT/250ML-% IV SOLN
1300.0000 [IU]/h | INTRAVENOUS | Status: DC
  Administered 2023-01-10: 1200 [IU]/h via INTRAVENOUS
  Administered 2023-01-10: 1000 [IU]/h via INTRAVENOUS
  Administered 2023-01-11: 1300 [IU]/h via INTRAVENOUS
  Filled 2023-01-09 (×3): qty 250

## 2023-01-09 MED ORDER — POTASSIUM CHLORIDE 10 MEQ/100ML IV SOLN
10.0000 meq | INTRAVENOUS | Status: AC
  Administered 2023-01-09 – 2023-01-10 (×2): 10 meq via INTRAVENOUS
  Filled 2023-01-09 (×2): qty 100

## 2023-01-09 MED ORDER — HEPARIN BOLUS VIA INFUSION
1800.0000 [IU] | Freq: Once | INTRAVENOUS | Status: AC
  Administered 2023-01-10: 1800 [IU] via INTRAVENOUS
  Filled 2023-01-09: qty 1800

## 2023-01-09 MED ORDER — POTASSIUM CHLORIDE 20 MEQ PO PACK
40.0000 meq | PACK | ORAL | Status: AC
  Filled 2023-01-09: qty 2

## 2023-01-09 MED ORDER — METRONIDAZOLE 500 MG/100ML IV SOLN
500.0000 mg | Freq: Once | INTRAVENOUS | Status: AC
  Administered 2023-01-09: 500 mg via INTRAVENOUS
  Filled 2023-01-09: qty 100

## 2023-01-09 MED ORDER — ACETAMINOPHEN 650 MG RE SUPP: 650.0000 mg | Freq: Four times a day (QID) | RECTAL | Status: DC | PRN

## 2023-01-09 MED ORDER — ENOXAPARIN SODIUM 40 MG/0.4ML IJ SOSY: 40.0000 mg | PREFILLED_SYRINGE | INTRAMUSCULAR | Status: DC

## 2023-01-09 MED ORDER — POTASSIUM CHLORIDE CRYS ER 20 MEQ PO TBCR
40.0000 meq | EXTENDED_RELEASE_TABLET | Freq: Once | ORAL | Status: DC
  Filled 2023-01-09: qty 2

## 2023-01-09 MED ORDER — OXYCODONE HCL 5 MG PO TABS: 5.0000 mg | ORAL_TABLET | ORAL | Status: DC | PRN

## 2023-01-09 MED ORDER — LACTATED RINGERS IV BOLUS
1000.0000 mL | Freq: Once | INTRAVENOUS | Status: AC
  Administered 2023-01-09: 1000 mL via INTRAVENOUS

## 2023-01-09 NOTE — ED Notes (Signed)
Pt transport to ct 

## 2023-01-09 NOTE — Progress Notes (Signed)
ANTICOAGULATION CONSULT NOTE - Initial Consult  Pharmacy Consult for heparin Indication: VTE treatment  Allergies  Allergen Reactions   Ambien [Zolpidem]    Amoxicillin     UNSPECIFIED REACTION   Has patient had a PCN reaction causing immediate rash, facial/tongue/throat swelling, SOB or lightheadedness with hypotension: Unknown Has patient had a PCN reaction causing severe rash involving mucus membranes or skin necrosis: Unknown Has patient had a PCN reaction that required hospitalization No Has patient had a PCN reaction occurring within the last 10 years: No If all of the above answers are "NO", then may proceed with Cephalosporin use.    Onion    Shellfish Allergy     Patient Measurements: Height: 5\' 7"  (170.2 cm) Weight: 68 kg (150 lb) IBW/kg (Calculated) : 61.6 Heparin Dosing Weight: 68kg  Vital Signs: Temp: 98.4 F (36.9 C) (07/24 2129) Temp Source: Oral (07/24 2129) BP: 132/74 (07/24 2130) Pulse Rate: 89 (07/24 2130)  Labs: Recent Labs    01/07/23 0000 01/09/23 1900  HGB 12.2 12.9  HCT 36 40.3  PLT 406* 196  CREATININE 0.9 0.66    Estimated Creatinine Clearance: 43.6 mL/min (by C-G formula based on SCr of 0.66 mg/dL).   Medical History: Past Medical History:  Diagnosis Date   Arthritis    Closed left femoral fracture (HCC) 2012   s/p ORIF, Dr. Ave Filter   Closed left radial fracture 2012   s/p reduction   Complication of anesthesia 08/14/2016   not able to think clear for a long time afterwards   Essential hypertension    Hyperlipidemia    Hypothyroidism      Assessment: 89 y.o. female with medical history significant of hypothyroidism, hypertension, hyperlipidemia who presented to the emergency department with hematochezia and abdominal pain. Hematochezia most likely secondary to ischemic colitis, IMV clots noted on CT, pharmacy to dose heparin, no prior AC noted  CBC WNL, Scr 0.66  Goal of Therapy:  Heparin level 0.3-0.7 units/ml Monitor  platelets by anticoagulation protocol: Yes   Plan:  Heparin bolus 1800 units x 1 Start heparin drip at 1000 units/hr Heparin level in 8 hours Daily CBC  Arley Phenix RPh 01/09/2023, 11:21 PM

## 2023-01-09 NOTE — H&P (Signed)
History and Physical    [name] ACZ:[id number] DOB: [birth year] DOA: 01/09/2023  PCP: Mahlon Gammon, MD   Chief Complaint: hematochezia  HPI: Amber Bryant is a 89 y.o. female with medical history significant of hypothyroidism, hypertension, hyperlipidemia who presented to the emergency department with hematochezia and abdominal pain.  Patient was seen several weeks ago and diagnosed with colitis and discharged on outpatient regimen.  Patient states that since discharge she has had persistent blood in her stool and intermittent abdominal pain.  Nursing was concerned about her pain and hematochezia so they referred her to the emergency department further assessment.  On arrival she was afebrile hemodynamically stable.  Labs were obtained which revealed WBC 11.9, hemoglobin 12.9, sodium 134, potassium 3.1, creatinine 0.66.  CT abdomen pelvis demonstrated thrombus of the SMV and IMC as well as pancolitis.  Patient's pancolitis was also noted to be progressed from CT 2 weeks prior.  Patient was admitted for further workup on evaluation she denied overt pain however had hematochezia.  She states she does not member last she had a colonoscopy.  She denies any weight loss or other complaints.  No NSAIDs or blood thinners   Review of Systems:    As per HPI otherwise 10 point review of systems negative.   Allergies  Allergen Reactions   Ambien [Zolpidem]    Amoxicillin     UNSPECIFIED REACTION   Has patient had a PCN reaction causing immediate rash, facial/tongue/throat swelling, SOB or lightheadedness with hypotension: Unknown Has patient had a PCN reaction causing severe rash involving mucus membranes or skin necrosis: Unknown Has patient had a PCN reaction that required hospitalization No Has patient had a PCN reaction occurring within the last 10 years: No If all of the above answers are "NO", then may proceed with Cephalosporin use.    Onion    Shellfish Allergy     Past Medical  History:  Diagnosis Date   Arthritis    Closed left femoral fracture (HCC) 2012   s/p ORIF, Dr. Ave Filter   Closed left radial fracture 2012   s/p reduction   Complication of anesthesia 08/14/2016   not able to think clear for a long time afterwards   Essential hypertension    Hyperlipidemia    Hypothyroidism     Past Surgical History:  Procedure Laterality Date   ABDOMINAL HYSTERECTOMY     CATARACT EXTRACTION W/ INTRAOCULAR LENS  IMPLANT, BILATERAL Bilateral 2014   COLONOSCOPY     ORIF FEMUR FRACTURE Left 04/08/2011   Dr. Ave Filter   TOTAL HIP ARTHROPLASTY Left 08/20/2016   Procedure: TOTAL HIP ARTHROPLASTY POSTERIOR REMOVE TROCH NAIL;  Surgeon: Gean Birchwood, MD;  Location: [facility] OR;  Service: Orthopedics;  Laterality: Left;     reports that she has never smoked. She has never used smokeless tobacco. She reports that she does not drink alcohol and does not use drugs.  Family History  Problem Relation Age of Onset   Colon cancer Mother    Heart disease Father    Esophageal cancer Neg Hx    Stomach cancer Neg Hx    Pancreatic cancer Neg Hx     Prior to Admission medications   Medication Sig Start Date End Date Taking? Authorizing Provider  acetaminophen (TYLENOL) 325 MG tablet Take 650 mg by mouth 3 (three) times daily as needed.    [provider]  azithromycin (ZITHROMAX) 500 MG tablet Take 2 tablets by mouth daily. Take 1 hour before any dental procedures. As Needed  [provider]  Cholecalciferol 50 MCG (2000 UT) CAPS Take 1 capsule (2,000 Units total) by mouth daily. Patient not taking: Reported on 01/09/2023 07/10/21   Fletcher Anon, NP  hydrochlorothiazide (HYDRODIURIL) 25 MG tablet Take 0.5 tablets (12.5 mg total) by mouth daily. Patient not taking: Reported on 01/09/2023 02/21/22   Mahlon Gammon, MD  levothyroxine (SYNTHROID) 25 MCG tablet Take 1 tablet (25 mcg total) by mouth every morning. 08/21/21   Mahlon Gammon, MD  lovastatin (MEVACOR) 20 MG  tablet TAKE 1 TABLET BY MOUTH EVERY MORNING Patient not taking: Reported on 01/09/2023 01/11/22   Mahlon Gammon, MD  Lutein-Zeaxanthin 25-5 MG CAPS Take 1 capsule by mouth daily at 6 (six) AM. Patient not taking: Reported on 01/09/2023    [provider]  omeprazole (PRILOSEC) 20 MG capsule Take 20 mg by mouth daily.    [provider]  ondansetron (ZOFRAN) 4 MG tablet Take 4 mg by mouth every 6 (six) hours as needed for nausea or vomiting.    [provider]  polyethylene glycol (MIRALAX / GLYCOLAX) 17 g packet Take 17 g by mouth daily as needed.    [provider]  potassium chloride (KLOR-CON) 10 MEQ tablet Take 10 mEq by mouth daily. X 5 days 01/08/23   [provider]  vitamin C (ASCORBIC ACID) 250 MG tablet Take 125 mg by mouth daily.    [provider]    Physical Exam: Vitals:   01/09/23 1802 01/09/23 1804 01/09/23 2129 01/09/23 2130  BP:  119/60 135/75 132/74  Pulse:  81 89 89  Resp:  18 (!) 22 19  Temp:  98.4 F (36.9 C) 98.4 F (36.9 C)   TempSrc:  Oral Oral   SpO2:  95% 91% 91%  Weight: 68 kg     Height: 5\' 7"  (1.702 m)      Physical Exam Vitals reviewed.  Constitutional:      Appearance: She is normal weight.  HENT:     Head: Normocephalic.  Cardiovascular:     Rate and Rhythm: Normal rate and regular rhythm.  Pulmonary:     Effort: Pulmonary effort is normal.  Abdominal:     General: Abdomen is flat. Bowel sounds are normal.  Skin:    General: Skin is warm.  Neurological:     General: No focal deficit present.     Mental Status: She is alert.  Psychiatric:        Mood and Affect: Mood normal.        Labs on Admission: I have personally reviewed the patients's labs and imaging studies.  Assessment/Plan  # Hematochezia most likely secondary to ischemic colitis - Patient completed course of antibiotics however did not respond -SMV, IMV clots noted on CT Plan: Hemoglobin stable at 12.9 to suspicion  for acute blood loss anemia as low Placed on clear liquid diet Consult GI for consideration of endoscopic evaluation though clinical suspicion is exceptionally high for ischemic colitis and patient likely poor candidate for procedure.  Start on heparin drip Supportive care with IV fluids Vascular surgery was consulted in the ER and recommended supportive care. CLD  # Hypothyroidism-continue leave thyroxine  Admission status: Inpatient Med-Surg  Certification: The appropriate patient status for this patient is INPATIENT. Inpatient status is judged to be reasonable and necessary in order to provide the required intensity of service to ensure the patient's safety. The patient's presenting symptoms, physical exam findings, and initial radiographic and laboratory data in the  context of their chronic comorbidities is felt to place them at high risk for further clinical deterioration. Furthermore, it is not anticipated that the patient will be medically stable for discharge from the hospital within 2 midnights of admission.   * I certify that at the point of admission it is my clinical judgment that the patient will require inpatient hospital care spanning beyond 2 midnights from the point of admission due to high intensity of service, high risk for further deterioration and high frequency of surveillance required.Alan Mulder MD [facility] If 7PM-7AM, please contact night-coverage [email or web address]  01/09/2023, 10:55 PM

## 2023-01-09 NOTE — ED Triage Notes (Signed)
Patient brought in by EMS from [facility] due to weakness, abdominal pain and loose/bloody stools. Per EMS, [facility] diagnosed patient with possible colitis but wanted patient to come here for CT scan. Pt has no complaints at this time.

## 2023-01-09 NOTE — Progress Notes (Signed)
Location:   [facility] Room Number: 155 Place of Service:  SNF ([phone]) Provider:  Hazle Nordmann, NP  Mahlon Gammon, MD  Patient Care Team: Mahlon Gammon, MD as PCP - General (Internal Medicine) Elise Benne, MD as Consulting Physician (Ophthalmology) Jones Broom, MD as Consulting Physician (Orthopedic Surgery)  Extended Emergency Contact Information Primary Emergency Contact: Daisy Floro of [place] Home Phone: [phone] Relation: Daughter  Code Status:  FULL CODE Goals of care: Advanced Directive information    01/09/2023   11:37 AM  Advanced Directives  Does Patient Have a Medical Advance Directive? Yes  Type of Advance Directive Healthcare Power of Attorney  Does patient want to make changes to medical advance directive? No - Patient declined  Copy of Healthcare Power of Attorney in Chart? Yes - validated most recent copy scanned in chart (See row information)     Chief Complaint  Patient presents with   Acute Visit    Bloody stools    HPI:  Pt is a 89 y.o. female seen today for an acute visit due to bloody stools.   She currently resides on the rehab unit at [facility]. PMH: HTN, HLD, chronic venous insufficiency, pulmonary nodule, hypothyroidism, osteoporosis, left hip ORIF 2012, LATH 2018, diverticulitis, and constipation.   07/09 she presented to the ED due to red blood in stool and abdominal pain. H/o diverticulitis 2022. CT abdomen showed mild wall thickening, pericolonic edema to sigmoid colon suspicious for colitis. WBC 7.2, Hgb 11.8, K+ 3.0, other labs unremarkable. She was discharged with Cipro and Flagyl. 07/10 she called [facility] concerned about antibiotics prescribed, advised to finish antibiotics.   07/22 she was admitted to rehab due to progressive weakness and nausea. She has finished antibiotics. Bowel movements had returned to normal. She was started on Zofran and Prilosec due to ongoing  nausea. PO medications were also held. 07/23 she had one episode of bloody stools. Today, she has had 3 blood stools per nursing. She has also been nauseous. Appetite remains poor per dietary. She is only tolerating milk shake. Treatment options discussed with patient. She refuses ED evaluation. She also refused repeat CT abdomen at this time.   Past Medical History:  Diagnosis Date   Arthritis    Closed left femoral fracture (HCC) 2012   s/p ORIF, Dr. Ave Filter   Closed left radial fracture 2012   s/p reduction   Complication of anesthesia 08/14/2016   not able to think clear for a long time afterwards   Essential hypertension    Hyperlipidemia    Hypothyroidism    Past Surgical History:  Procedure Laterality Date   ABDOMINAL HYSTERECTOMY     CATARACT EXTRACTION W/ INTRAOCULAR LENS  IMPLANT, BILATERAL Bilateral 2014   COLONOSCOPY     ORIF FEMUR FRACTURE Left 04/08/2011   Dr. Ave Filter   TOTAL HIP ARTHROPLASTY Left 08/20/2016   Procedure: TOTAL HIP ARTHROPLASTY POSTERIOR REMOVE TROCH NAIL;  Surgeon: Gean Birchwood, MD;  Location: [facility] OR;  Service: Orthopedics;  Laterality: Left;    Allergies  Allergen Reactions   Ambien [Zolpidem]    Amoxicillin     UNSPECIFIED REACTION   Has patient had a PCN reaction causing immediate rash, facial/tongue/throat swelling, SOB or lightheadedness with hypotension: Unknown Has patient had a PCN reaction causing severe rash involving mucus membranes or skin necrosis: Unknown Has patient had a PCN reaction that required hospitalization No Has patient had a PCN reaction occurring within the last 10 years: No If  all of the above answers are "NO", then may proceed with Cephalosporin use.    Onion    Shellfish Allergy     Allergies as of 01/09/2023       Reactions   Ambien [zolpidem]    Amoxicillin    UNSPECIFIED REACTION  Has patient had a PCN reaction causing immediate rash, facial/tongue/throat swelling, SOB or lightheadedness with hypotension:  Unknown Has patient had a PCN reaction causing severe rash involving mucus membranes or skin necrosis: Unknown Has patient had a PCN reaction that required hospitalization No Has patient had a PCN reaction occurring within the last 10 years: No If all of the above answers are "NO", then may proceed with Cephalosporin use.   Onion    Shellfish Allergy         Medication List        Accurate as of January 09, 2023 12:00 PM. If you have any questions, ask your nurse or doctor.          acetaminophen 325 MG tablet Commonly known as: TYLENOL Take 650 mg by mouth 3 (three) times daily as needed.   azithromycin 500 MG tablet Commonly known as: ZITHROMAX Take 2 tablets by mouth daily. Take 1 hour before any dental procedures. As Needed   Cholecalciferol 50 MCG (2000 UT) Caps Take 1 capsule (2,000 Units total) by mouth daily.   hydrochlorothiazide 25 MG tablet Commonly known as: HYDRODIURIL Take 0.5 tablets (12.5 mg total) by mouth daily.   levothyroxine 25 MCG tablet Commonly known as: SYNTHROID Take 1 tablet (25 mcg total) by mouth every morning.   lovastatin 20 MG tablet Commonly known as: MEVACOR TAKE 1 TABLET BY MOUTH EVERY MORNING   Lutein-Zeaxanthin 25-5 MG Caps Take 1 capsule by mouth daily at 6 (six) AM.   omeprazole 20 MG capsule Commonly known as: PRILOSEC Take 20 mg by mouth daily.   ondansetron 4 MG tablet Commonly known as: ZOFRAN Take 4 mg by mouth every 6 (six) hours as needed for nausea or vomiting.   polyethylene glycol 17 g packet Commonly known as: MIRALAX / GLYCOLAX Take 17 g by mouth daily as needed.   potassium chloride 10 MEQ tablet Commonly known as: KLOR-CON Take 10 mEq by mouth daily. X 5 days   vitamin C 250 MG tablet Commonly known as: ASCORBIC ACID Take 125 mg by mouth daily. What changed: Another medication with the same name was removed. Continue taking this medication, and follow the directions you see here. Changed by: Octavia Heir        Review of Systems  Constitutional:  Positive for activity change and appetite change. Negative for fever.  Respiratory:  Negative for cough, shortness of breath and wheezing.   Cardiovascular:  Negative for chest pain and leg swelling.  Gastrointestinal:  Positive for abdominal distention, abdominal pain and blood in stool.  Genitourinary:  Negative for dysuria.  Musculoskeletal:  Positive for gait problem.  Skin:  Negative for wound.  Neurological:  Positive for weakness. Negative for dizziness and light-headedness.  Psychiatric/Behavioral:  Negative for confusion and dysphoric mood. The patient is not nervous/anxious.     Immunization History  Administered Date(s) Administered   Covid-19, Mrna,Vaccine(Spikevax)14yrs and older 10/11/2022   Fluad Quad(high Dose 65+) 03/20/2022   H1N1 05/27/2008   Influenza, High Dose Seasonal PF 03/23/2016, 04/03/2017, 02/24/2018, 04/15/2020   Influenza-Unspecified 03/18/2016, 03/19/2019   Moderna SARS-COV2 Booster Vaccination 11/20/2021   Moderna Sars-Covid-2 Vaccination 06/30/2019, 07/28/2019, 05/03/2020, 09/28/2020, 03/31/2021   Pneumococcal-Unspecified  11/04/2013   Zoster Recombinant(Shingrix) 06/18/2014, 08/17/2014   Pertinent  Health Maintenance Due  Topic Date Due   INFLUENZA VACCINE  01/17/2023   DEXA SCAN  Completed      05/26/2022    8:14 AM 07/16/2022    3:06 PM 07/16/2022    3:25 PM 07/16/2022    3:26 PM 12/25/2022    3:50 PM  Fall Risk  Falls in the past year?  0 0 0 0  Was there an injury with Fall?  0 0  0  Fall Risk Category Calculator  0 0  0  (RETIRED) Patient Fall Risk Level Moderate fall risk      Patient at Risk for Falls Due to   Impaired mobility  No Fall Risks  Fall risk Follow up   Falls evaluation completed  Falls evaluation completed   Functional Status Survey:    Vitals:   01/09/23 1126  BP: 118/62  Pulse: 77  Resp: 12  Temp: 98.2 F (36.8 C)  SpO2: 94%  Weight: 161 lb (73 kg)  Height:  5\' 7"  (1.702 m)   Body mass index is 25.22 kg/m. Physical Exam Vitals reviewed.  Constitutional:      General: She is not in acute distress. HENT:     Head: Normocephalic.  Eyes:     General:        Right eye: No discharge.        Left eye: No discharge.  Cardiovascular:     Rate and Rhythm: Normal rate and regular rhythm.     Pulses: Normal pulses.     Heart sounds: Murmur heard.  Pulmonary:     Effort: Pulmonary effort is normal. No respiratory distress.     Breath sounds: Normal breath sounds. No wheezing or rales.  Abdominal:     General: There is distension.     Palpations: There is no mass.     Tenderness: There is abdominal tenderness. There is no guarding or rebound.     Hernia: No hernia is present.     Comments: Tenderness to RUQ & LUQ  Musculoskeletal:     Cervical back: Neck supple.     Right lower leg: No edema.     Left lower leg: No edema.  Skin:    General: Skin is warm.     Capillary Refill: Capillary refill takes less than 2 seconds.  Neurological:     General: No focal deficit present.     Mental Status: She is alert and oriented to person, place, and time.     Motor: Weakness present.     Gait: Gait abnormal.     Comments: walker  Psychiatric:        Mood and Affect: Mood normal.     Labs reviewed: Recent Labs    06/21/22 0000 12/25/22 1828 01/07/23 0000  NA 141 136 134*  K 3.8 3.0* 3.4*  CL 103 100 96*  CO2 24* 26 26*  GLUCOSE  --  113*  --   BUN 19 19 16   CREATININE 0.8 0.81 0.9  CALCIUM 9.3 9.9 8.4*   Recent Labs    06/21/22 0000 12/25/22 1828 01/07/23 0000  AST 22 15  --   ALT 9 9  --   ALKPHOS 104 105 82  BILITOT  --  0.6  --   PROT  --  7.1  --   ALBUMIN  --  4.1 3.2*   Recent Labs    06/21/22 0000 12/25/22 1828 01/07/23  0000  WBC 5.0 7.2 9.2  NEUTROABS  --   --  6.60  HGB 12.3 11.8* 12.2  HCT 37 35.5* 36  MCV  --  92.4  --   PLT 213 257 406*   Lab Results  Component Value Date   TSH 3.76 06/21/2022    Lab Results  Component Value Date   HGBA1C 5.7 (H) 04/10/2011   Lab Results  Component Value Date   CHOL 169 06/21/2022   HDL 76 (A) 06/21/2022   LDLCALC 80 06/21/2022   TRIG 67 06/21/2022    Significant Diagnostic Results in last 30 days:  CT ABDOMEN PELVIS W CONTRAST  Result Date: 12/25/2022 CLINICAL DATA:  Abdominal pain and rectal bleeding. EXAM: CT ABDOMEN AND PELVIS WITH CONTRAST TECHNIQUE: Multidetector CT imaging of the abdomen and pelvis was performed using the standard protocol following bolus administration of intravenous contrast. RADIATION DOSE REDUCTION: This exam was performed according to the departmental dose-optimization program which includes automated exposure control, adjustment of the mA and/or kV according to patient size and/or use of iterative reconstruction technique. CONTRAST:  80mL OMNIPAQUE IOHEXOL 300 MG/ML  SOLN COMPARISON:  07/29/2020 FINDINGS: Lower chest: No basilar airspace disease or pleural effusion. Hepatobiliary: Tiny cyst in the subcapsular right lobe of the liver. No suspicious liver lesion. Layering gallstones within physiologically distended gallbladder. No biliary dilatation. Pancreas: 7 mm cyst in the pancreatic tail, series 3, image 26. No ductal dilatation or inflammation. Spleen: Normal in size without focal abnormality. Adrenals/Urinary Tract: Normal adrenal glands. No hydronephrosis. There are bilateral parapelvic cysts. 9 cm cortical cyst arises from the upper pole of the right kidney. No specific imaging follow-up is recommended. No evidence of solid lesion. The urinary bladder is partially distended, normal for degree of distension. Stomach/Bowel: There is mild wall thickening with pericolonic edema throughout the sigmoid colon, for example series 3 images 57 and 58. Minimal wall thickening is also seen involving the distal descending colon, for example series 3, image 39. multiple colonic diverticula, although length of colonic involvement  favors colitis over diverticulitis. There is moderate colonic stool burden proximally. Normal appendix potentially visualized. The stomach is nondistended. There is no small bowel inflammation. Vascular/Lymphatic: Aortic atherosclerosis without aneurysm. 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 Distal branches are patent. The portal vein is patent. Increased number of lymph nodes adjacent to the inferior mesenteric vessels as well as adjacent to inflamed sigmoid colon, likely reactive. Reproductive: Status post hysterectomy. No adnexal masses. Other: No free air or perforation. No significant ascites. No focal fluid collection. No abdominal wall hernia. Musculoskeletal: Left hip arthroplasty. Scoliosis and degenerative change in the spine. IMPRESSION: 1. Mild wall thickening with pericolonic edema throughout the sigmoid colon and to a lesser extent the distal descending colon, suspicious for colitis. There are multiple colonic diverticula, although length of colonic involvement favors colitis over diverticulitis. 2. Increased number of lymph nodes adjacent to the inferior mesenteric vessels as well as adjacent to inflamed sigmoid colon, likely reactive. 3. Cholelithiasis without CT findings of acute cholecystitis. 4. A 7 mm cyst in the pancreatic tail. Per consensus guidelines call in a patient of this age for a cyst of this size, the decision to pursue imaging follow-up should be anchored to the patient's overall health and preferences, such workup is only advised if the patient is a surgical candidate. Follow-up imaging in 2 years can be considered. Aortic Atherosclerosis (ICD10-I70.0). Electronically Signed   By: Shawna Orleans  Sanford M.D.   On: 12/25/2022 21:14    Assessment/Plan 1. Colitis - 07/09 ED evaluation due to rectal bleeding and nausea> prescribed Cipro and Flagyl> completed  - CT abdomen suggested colitis - 07/22 admitted to rehab  - 07/23  rectal bleeding and nausea returned - increased tenderness to RUQ & LUQ - refused ED evaluation> agreed to go 1 hour after encounter - refusing repeat CT abdomen at this time  - cbc/diff  2. Nausea - ongoing - poor appetite - see above - cont Zofran - will increase Prilosec to 40 mg   Family/ staff Communication: plan discussed with patient and nurse  Labs/tests ordered:   cbc/diff

## 2023-01-09 NOTE — ED Notes (Signed)
ED TO INPATIENT HANDOFF REPORT  Name/Age/Gender Amber Bryant 89 y.o. female  Code Status    Code Status Orders  (From admission, onward)           Start     Ordered   01/09/23 2255  Full code  Continuous       Question:  By:  Answer:  Consent: discussion documented in EHR   01/09/23 2255           Code Status History     Date Active Date Inactive Code Status Order ID Comments User Context   08/04/2020 1620 03/26/2021 1043 Full Code [id number]  Fletcher Anon, NP Outpatient   08/28/2016 1555 08/04/2020 1620 DNR [id number]  Kermit Balo Outpatient   08/20/2016 1529 08/22/2016 1651 Full Code [id number]  Allena Katz, PA-C Inpatient      Advance Directive Documentation    Flowsheet Row Most Recent Value  Type of Advance Directive Healthcare Power of Attorney, Living will  Pre-existing out of facility DNR order (yellow form or pink MOST form) --  "MOST" Form in Place? --       Home/SNF/Other Skilled nursing facility  Chief Complaint Acute blood loss anemia [D62]  Level of Care/Admitting Diagnosis ED Disposition     ED Disposition  Admit   Condition  --   Comment  Hospital Area: [facility] [[id number]]  Level of Care: Med-Surg [16]  May admit patient to [facility] or [facility] if equivalent level of care is available:: No  Covid Evaluation: Asymptomatic - no recent exposure (last 10 days) testing not required  Diagnosis: Acute blood loss anemia [270623]  Admitting Physician: Alan Mulder [[id number]]  Attending Physician: Alan Mulder [phone]  Certification:: I certify this patient will need inpatient services for at least 2 midnights  Estimated Length of Stay: 4          Medical History Past Medical History:  Diagnosis Date   Arthritis    Closed left femoral fracture (HCC) 2012   s/p ORIF, Dr. Ave Filter   Closed left radial fracture 2012   s/p reduction   Complication of anesthesia 08/14/2016   not able to think  clear for a long time afterwards   Essential hypertension    Hyperlipidemia    Hypothyroidism     Allergies Allergies  Allergen Reactions   Ambien [Zolpidem]    Amoxicillin     UNSPECIFIED REACTION   Has patient had a PCN reaction causing immediate rash, facial/tongue/throat swelling, SOB or lightheadedness with hypotension: Unknown Has patient had a PCN reaction causing severe rash involving mucus membranes or skin necrosis: Unknown Has patient had a PCN reaction that required hospitalization No Has patient had a PCN reaction occurring within the last 10 years: No If all of the above answers are "NO", then may proceed with Cephalosporin use.    Onion    Shellfish Allergy     IV Location/Drains/Wounds Patient Lines/Drains/Airways Status     Active Line/Drains/Airways     Name Placement date Placement time Site Days   Peripheral IV 01/09/23 20 G Right Antecubital 01/09/23  1851  Antecubital  less than 1            Labs/Imaging Results for orders placed or performed during the hospital encounter of 01/09/23 (from the past 48 hour(s))  Comprehensive metabolic panel     Status: Abnormal   Collection Time: 01/09/23  7:00 PM  Result Value Ref Range   Sodium 134 (L) 135 - 145  mmol/L   Potassium 3.1 (L) 3.5 - 5.1 mmol/L   Chloride 96 (L) 98 - 111 mmol/L   CO2 25 22 - 32 mmol/L   Glucose, Bld 125 (H) 70 - 99 mg/dL    Comment: Glucose reference range applies only to samples taken after fasting for at least 8 hours.   BUN 13 8 - 23 mg/dL   Creatinine, Ser 4.09 0.44 - 1.00 mg/dL   Calcium 8.6 (L) 8.9 - 10.3 mg/dL   Total Protein 6.8 6.5 - 8.1 g/dL   Albumin 2.8 (L) 3.5 - 5.0 g/dL   AST 20 15 - 41 U/L   ALT 12 0 - 44 U/L   Alkaline Phosphatase 104 38 - 126 U/L   Total Bilirubin 0.8 0.3 - 1.2 mg/dL   GFR, Estimated >81 >19 mL/min    Comment: (NOTE) Calculated using the CKD-EPI Creatinine Equation (2021)    Anion gap 13 5 - 15    Comment: Performed at [facility], [address] [place], [place] [address]  CBC with Differential     Status: Abnormal   Collection Time: 01/09/23  7:00 PM  Result Value Ref Range   WBC 11.9 (H) 4.0 - 10.5 K/uL   RBC 4.25 3.87 - 5.11 MIL/uL   Hemoglobin 12.9 12.0 - 15.0 g/dL   HCT 95.6 21.3 - 08.6 %   MCV 94.8 80.0 - 100.0 fL   MCH 30.4 26.0 - 34.0 pg   MCHC 32.0 30.0 - 36.0 g/dL   RDW 57.8 46.9 - 62.9 %   Platelets 196 150 - 400 K/uL   nRBC 0.0 0.0 - 0.2 %   Neutrophils Relative % 80 %   Neutro Abs 9.6 (H) 1.7 - 7.7 K/uL   Lymphocytes Relative 9 %   Lymphs Abs 1.0 0.7 - 4.0 K/uL   Monocytes Relative 8 %   Monocytes Absolute 0.9 0.1 - 1.0 K/uL   Eosinophils Relative 0 %   Eosinophils Absolute 0.0 0.0 - 0.5 K/uL   Basophils Relative 0 %   Basophils Absolute 0.0 0.0 - 0.1 K/uL   WBC Morphology DOHLE BODIES     Comment: INCREASED BANDS (>20% BANDS) TOXIC GRANULATION VACUOLATED NEUTROPHILS    Immature Granulocytes 3 %   Abs Immature Granulocytes 0.32 (H) 0.00 - 0.07 K/uL   Polychromasia PRESENT     Comment: Performed at [facility], [address], [place], [place] [address]  Lipase, blood     Status: None   Collection Time: 01/09/23  7:00 PM  Result Value Ref Range   Lipase 22 11 - 51 U/L    Comment: Performed at [facility], [address] [place], [place] [address]  Magnesium     Status: None   Collection Time: 01/09/23  7:55 PM  Result Value Ref Range   Magnesium 1.9 1.7 - 2.4 mg/dL    Comment: Performed at [facility], [address] [place], [place] [address]  POC occult blood, ED     Status: Abnormal   Collection Time: 01/09/23  8:40 PM  Result Value Ref Range   Fecal Occult Bld POSITIVE (A) NEGATIVE   CT ABDOMEN PELVIS W CONTRAST  Result Date: 01/09/2023 CLINICAL DATA:  Weakness, abdominal pain, loose/bloody stools EXAM: CT ABDOMEN AND PELVIS WITH CONTRAST TECHNIQUE: Multidetector CT imaging of the abdomen and  pelvis was performed using the standard protocol following bolus administration of intravenous contrast. RADIATION DOSE REDUCTION: This exam was performed  according to the departmental dose-optimization program which includes automated exposure control, adjustment of the mA and/or kV according to patient size and/or use of iterative reconstruction technique. CONTRAST:  OMNIPAQUE IOHEXOL 300 MG/ML  SOLN COMPARISON:  CT 12/25/2022 FINDINGS: Lower chest: No acute abnormality. Hepatobiliary: Cholelithiasis. No focal hepatic lesion. No evidence of cholecystitis. No biliary dilation. Pancreas: Unremarkable. Spleen: Unremarkable. Adrenals/Urinary Tract: Stable adrenal glands. Low-attenuation lesions in the kidneys are statistically likely to represent cysts. No follow-up is required. No urinary calculi or hydronephrosis. Unremarkable bladder. Stomach/Bowel: Diffuse colonic wall thickening and mucosal hyperenhancement with adjacent pericolonic inflammatory stranding. This has progressed from 12/25/2022. No evidence of obstruction. Stomach is within normal limits. Vascular/Lymphatic: New thrombus within the superior mesenteric vein causes severe narrowing and near occlusion (series 2/image 29). There is additional thrombus within the inferior mesenteric vein causing severe narrowing (series 2/image 26. Splenic and portal veins are patent. No lymphadenopathy by size. Reproductive: No acute abnormality. Other: Trace free fluid in the pelvis and right paracolic gutter. No free intraperitoneal air. Musculoskeletal: No acute fracture.  Left THA. IMPRESSION: 1. 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 2. Pancolitis, presumably infectious/inflammatory and progressed from 12/25/2022. These results were called by telephone at the time of interpretation on 01/09/2023 at 8:47 pm to provider [facility] , who verbally acknowledged these results. Electronically Signed   By: Minerva Fester M.D.   On:  01/09/2023 20:48    Pending Labs Unresulted Labs (From admission, onward)     Start     Ordered   01/16/23 0500  Creatinine, serum  (enoxaparin (LOVENOX)    CrCl >/= 30 ml/min)  Weekly,   R     Comments: while on enoxaparin therapy    01/09/23 2255   01/10/23 0500  Basic metabolic panel  Tomorrow morning,   R        01/09/23 2255   01/10/23 0500  CBC  Tomorrow morning,   R        01/09/23 2255   01/09/23 2254  CBC  (enoxaparin (LOVENOX)    CrCl >/= 30 ml/min)  Once,   R       Comments: Baseline for enoxaparin therapy IF NOT ALREADY DRAWN.  Notify MD if PLT < 100 K.    01/09/23 2255   01/09/23 2254  Creatinine, serum  (enoxaparin (LOVENOX)    CrCl >/= 30 ml/min)  Once,   R       Comments: Baseline for enoxaparin therapy IF NOT ALREADY DRAWN.    01/09/23 2255   01/09/23 1825  Urinalysis, Routine w reflex microscopic -Urine, Clean Catch  Once,   URGENT       Question:  Specimen Source  Answer:  Urine, Clean Catch   01/09/23 1824            Vitals/Pain Today's Vitals   01/09/23 1802 01/09/23 1804 01/09/23 2129 01/09/23 2130  BP:  119/60 135/75 132/74  Pulse:  81 89 89  Resp:  18 (!) 22 19  Temp:  98.4 F (36.9 C) 98.4 F (36.9 C)   TempSrc:  Oral Oral   SpO2:  95% 91% 91%  Weight: 150 lb (68 kg)     Height: 5\' 7"  (1.702 m)     PainSc: 0-No pain       Isolation Precautions No active isolations  Medications Medications  cefTRIAXone (ROCEPHIN) 2 g in sodium chloride 0.9 % 100 mL IVPB (0 g Intravenous Stopped 01/09/23 2208)  And  metroNIDAZOLE (FLAGYL) IVPB 500 mg (500 mg Intravenous New Bag/Given 01/09/23 2212)  potassium chloride (KLOR-CON) packet 40 mEq (40 mEq Oral Not Given 01/09/23 2126)  potassium chloride 10 mEq in 100 mL IVPB (has no administration in time range)  levothyroxine (SYNTHROID) tablet 25 mcg (has no administration in time range)  enoxaparin (LOVENOX) injection 40 mg (has no administration in time range)  acetaminophen (TYLENOL) tablet 650 mg  (has no administration in time range)    Or  acetaminophen (TYLENOL) suppository 650 mg (has no administration in time range)  oxyCODONE (Oxy IR/ROXICODONE) immediate release tablet 5 mg (has no administration in time range)  lactated ringers infusion (has no administration in time range)  lactated ringers bolus 1,000 mL (0 mLs Intravenous Stopped 01/09/23 2054)  iohexol (OMNIPAQUE) 300 MG/ML solution 100 mL (100 mLs Intravenous Contrast Given 01/09/23 2023)

## 2023-01-09 NOTE — ED Provider Notes (Signed)
[place] EMERGENCY DEPARTMENT AT [facility] Provider Note   CSN: [id number] Arrival date & time: 01/09/23  1741     History  Chief Complaint  Patient presents with   Abdominal Pain   Weakness    Amber Bryant is a 89 y.o. female.  Is a 89 year old female with a past medical history of prior diverticulitis and hyperlipidemia presenting to the emergency department with abdominal pain and weakness.  The patient was seen in the ED 2 to 3 weeks ago and was diagnosed with colitis and discharged on antibiotics.  She had completed the antibiotics as prescribed but reports that over the last few days she has had increased lower abdominal pain and has had blood in her stools again.  She denies any fevers, nausea or vomiting but does report decreased appetite.  She was seen by her doctor at the nursing home who recommended that she come to the emergency department to have repeat imaging.  The history is provided by the patient, medical records and the nursing home.  Abdominal Pain Weakness Associated symptoms: abdominal pain        Home Medications Prior to Admission medications   Medication Sig Start Date End Date Taking? Authorizing Provider  acetaminophen (TYLENOL) 325 MG tablet Take 650 mg by mouth 3 (three) times daily as needed.    [provider]  azithromycin (ZITHROMAX) 500 MG tablet Take 2 tablets by mouth daily. Take 1 hour before any dental procedures. As Needed    [provider]  Cholecalciferol 50 MCG (2000 UT) CAPS Take 1 capsule (2,000 Units total) by mouth daily. Patient not taking: Reported on 01/09/2023 07/10/21   Fletcher Anon, NP  hydrochlorothiazide (HYDRODIURIL) 25 MG tablet Take 0.5 tablets (12.5 mg total) by mouth daily. Patient not taking: Reported on 01/09/2023 02/21/22   Mahlon Gammon, MD  levothyroxine (SYNTHROID) 25 MCG tablet Take 1 tablet (25 mcg total) by mouth every morning. 08/21/21   Mahlon Gammon, MD  lovastatin  (MEVACOR) 20 MG tablet TAKE 1 TABLET BY MOUTH EVERY MORNING Patient not taking: Reported on 01/09/2023 01/11/22   Mahlon Gammon, MD  Lutein-Zeaxanthin 25-5 MG CAPS Take 1 capsule by mouth daily at 6 (six) AM. Patient not taking: Reported on 01/09/2023    [provider]  omeprazole (PRILOSEC) 20 MG capsule Take 20 mg by mouth daily.    [provider]  ondansetron (ZOFRAN) 4 MG tablet Take 4 mg by mouth every 6 (six) hours as needed for nausea or vomiting.    [provider]  polyethylene glycol (MIRALAX / GLYCOLAX) 17 g packet Take 17 g by mouth daily as needed.    [provider]  potassium chloride (KLOR-CON) 10 MEQ tablet Take 10 mEq by mouth daily. X 5 days 01/08/23   [provider]  vitamin C (ASCORBIC ACID) 250 MG tablet Take 125 mg by mouth daily.    [provider]      Allergies    Ambien [zolpidem], Amoxicillin, Onion, and Shellfish allergy    Review of Systems   Review of Systems  Gastrointestinal:  Positive for abdominal pain.  Neurological:  Positive for weakness.    Physical Exam Updated Vital Signs BP 135/75 (BP Location: Left Arm)   Pulse 89   Temp 98.4 F (36.9 C) (Oral)   Resp (!) 22   Ht 5\' 7"  (1.702 m)   Wt 68 kg   SpO2 91%   BMI 23.49 kg/m  Physical Exam Vitals  and nursing note reviewed.  Constitutional:      General: She is not in acute distress.    Appearance: She is well-developed.  HENT:     Head: Normocephalic and atraumatic.     Mouth/Throat:     Pharynx: Oropharynx is clear.     Comments: Dry mucous membranes Eyes:     Extraocular Movements: Extraocular movements intact.  Cardiovascular:     Rate and Rhythm: Normal rate and regular rhythm.     Heart sounds: Normal heart sounds.  Pulmonary:     Effort: Pulmonary effort is normal.     Breath sounds: Normal breath sounds.  Abdominal:     General: Abdomen is flat.     Palpations: Abdomen is soft.     Tenderness: There is generalized  abdominal tenderness. There is no guarding or rebound.  Skin:    General: Skin is warm and dry.  Neurological:     General: No focal deficit present.     Mental Status: She is alert and oriented to person, place, and time.  Psychiatric:        Mood and Affect: Mood normal.        Behavior: Behavior normal.     ED Results / Procedures / Treatments   Labs (all labs ordered are listed, but only abnormal results are displayed) Labs Reviewed  COMPREHENSIVE METABOLIC PANEL - Abnormal; Notable for the following components:      Result Value   Sodium 134 (*)    Potassium 3.1 (*)    Chloride 96 (*)    Glucose, Bld 125 (*)    Calcium 8.6 (*)    Albumin 2.8 (*)    All other components within normal limits  CBC WITH DIFFERENTIAL/PLATELET - Abnormal; Notable for the following components:   WBC 11.9 (*)    All other components within normal limits  POC OCCULT BLOOD, ED - Abnormal; Notable for the following components:   Fecal Occult Bld POSITIVE (*)    All other components within normal limits  LIPASE, BLOOD  MAGNESIUM  URINALYSIS, ROUTINE W REFLEX MICROSCOPIC    EKG EKG Interpretation Date/Time:  Wednesday January 09 2023 18:39:36 EDT Ventricular Rate:  85 PR Interval:  162 QRS Duration:  68 QT Interval:  447 QTC Calculation: 532 R Axis:   -7  Text Interpretation: Sinus rhythm Nonspecific T abnormalities, diffuse leads Prolonged QT interval Otherwise no significant change Confirmed by Elayne Snare ([id number]) on 01/09/2023 7:54:41 PM  Radiology CT ABDOMEN PELVIS W CONTRAST  Result Date: 01/09/2023 CLINICAL DATA:  Weakness, abdominal pain, loose/bloody stools EXAM: CT ABDOMEN AND PELVIS WITH CONTRAST TECHNIQUE: Multidetector CT imaging of the abdomen and pelvis was performed using the standard protocol following bolus administration of intravenous contrast. RADIATION DOSE REDUCTION: This exam was performed according to the departmental dose-optimization program which includes  automated exposure control, adjustment of the mA and/or kV according to patient size and/or use of iterative reconstruction technique. CONTRAST:  OMNIPAQUE IOHEXOL 300 MG/ML  SOLN COMPARISON:  CT 12/25/2022 FINDINGS: Lower chest: No acute abnormality. Hepatobiliary: Cholelithiasis. No focal hepatic lesion. No evidence of cholecystitis. No biliary dilation. Pancreas: Unremarkable. Spleen: Unremarkable. Adrenals/Urinary Tract: Stable adrenal glands. Low-attenuation lesions in the kidneys are statistically likely to represent cysts. No follow-up is required. No urinary calculi or hydronephrosis. Unremarkable bladder. Stomach/Bowel: Diffuse colonic wall thickening and mucosal hyperenhancement with adjacent pericolonic inflammatory stranding. This has progressed from 12/25/2022. No evidence of obstruction. Stomach is within normal limits. Vascular/Lymphatic: New thrombus within the  superior mesenteric vein causes severe narrowing and near occlusion (series 2/image 29). There is additional thrombus within the inferior mesenteric vein causing severe narrowing (series 2/image 26. Splenic and portal veins are patent. No lymphadenopathy by size. Reproductive: No acute abnormality. Other: Trace free fluid in the pelvis and right paracolic gutter. No free intraperitoneal air. Musculoskeletal: No acute fracture.  Left THA. IMPRESSION: 1. 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 2. Pancolitis, presumably infectious/inflammatory and progressed from 12/25/2022. These results were called by telephone at the time of interpretation on 01/09/2023 at 8:47 pm to provider [facility] , who verbally acknowledged these results. Electronically Signed   By: Minerva Fester M.D.   On: 01/09/2023 20:48    Procedures Procedures    Medications Ordered in ED Medications  cefTRIAXone (ROCEPHIN) 2 g in sodium chloride 0.9 % 100 mL IVPB (2 g Intravenous New Bag/Given 01/09/23 2111)    And  metroNIDAZOLE (FLAGYL)  IVPB 500 mg (has no administration in time range)  lactated ringers bolus 1,000 mL (0 mLs Intravenous Stopped 01/09/23 2054)  iohexol (OMNIPAQUE) 300 MG/ML solution 100 mL (100 mLs Intravenous Contrast Given 01/09/23 2023)  potassium chloride (KLOR-CON) packet 40 mEq (40 mEq Oral Given 01/09/23 2126)    ED Course/ Medical Decision Making/ A&P Clinical Course as of 01/09/23 2133  Wed Jan 09, 2023  2050 CTAP with worsening pancolitis as well as new SMV and IMV thrombus compared to prior CT. Will consult vascular and start on antibiotics. No diarrhea here making C diff less likely.  [VK]  2056 I spoke with Dr. Lenell Antu of vascular who stated that there's a bystander effect from associated inflammation and infection. Does recommend starting on anticoagulation when bleeding is stable and likely will need anticoagulation for life. Patient will be admitted for IV antibiotics and initiation of anticoagulation. [VK]    Clinical Course User Index [VK] Rexford Maus, DO                             Medical Decision Making This patient presents to the ED with chief complaint(s) of abdominal pain with pertinent past medical history of diverticulitis, recent colitis, HLD which further complicates the presenting complaint. The complaint involves an extensive differential diagnosis and also carries with it a high risk of complications and morbidity.    The differential diagnosis includes diverticulitis, other intra-abdominal infection, abscess, perforation, dehydration, electrolyte abnormality, atypical ACS, anemia  Additional history obtained: Additional history obtained from nursing home/care facility Records reviewed Primary Care Documents  ED Course and Reassessment: On patient's arrival she is hemodynamically stable in no acute distress.  The patient will have repeat labs, EKG and CTA of her abdomen and pelvis to evaluate for cause of her symptoms.  She will be started on gentle fluids and will be  closely reassessed.  Independent labs interpretation:  The following labs were independently interpreted: hemoccult positive stool, mild leukocytosis, mild hypokalemia  Independent visualization of imaging: - I independently visualized the following imaging with scope of interpretation limited to determining acute life threatening conditions related to emergency care: CTAP, which revealed worsening pancolitis, new SMV/IMV thrombus  Consultation: - Consulted or discussed management/test interpretation w/ external professional: hospitalist, vascular  Consideration for admission or further workup: patient requires admission for colitis and initiation of anticoagulation Social Determinants of health: N/A    Amount and/or Complexity of Data Reviewed Labs: ordered. Radiology: ordered.  Risk Prescription drug management. Decision  regarding hospitalization.          Final Clinical Impression(s) / ED Diagnoses Final diagnoses:  Colitis  Superior mesenteric vein thrombosis [facility])    Rx / DC Orders ED Discharge Orders     None         Rexford Maus, DO 01/09/23 2133

## 2023-01-09 NOTE — ED Notes (Signed)
Pt would not consume potassium drink mix, pt spit back up, notified EDP.

## 2023-01-10 DIAGNOSIS — K529 Noninfective gastroenteritis and colitis, unspecified: Secondary | ICD-10-CM

## 2023-01-10 DIAGNOSIS — R634 Abnormal weight loss: Secondary | ICD-10-CM

## 2023-01-10 DIAGNOSIS — K55069 Acute infarction of intestine, part and extent unspecified: Secondary | ICD-10-CM | POA: Diagnosis not present

## 2023-01-10 DIAGNOSIS — D62 Acute posthemorrhagic anemia: Secondary | ICD-10-CM | POA: Diagnosis not present

## 2023-01-10 DIAGNOSIS — E039 Hypothyroidism, unspecified: Secondary | ICD-10-CM | POA: Diagnosis not present

## 2023-01-10 DIAGNOSIS — R933 Abnormal findings on diagnostic imaging of other parts of digestive tract: Secondary | ICD-10-CM

## 2023-01-10 LAB — CBC
HCT: 36.4 % (ref 36.0–46.0)
Hemoglobin: 11.6 g/dL — ABNORMAL LOW (ref 12.0–15.0)
MCH: 30.1 pg (ref 26.0–34.0)
MCHC: 31.9 g/dL (ref 30.0–36.0)
MCV: 94.3 fL (ref 80.0–100.0)
Platelets: 142 10*3/uL — ABNORMAL LOW (ref 150–400)
RBC: 3.86 MIL/uL — ABNORMAL LOW (ref 3.87–5.11)
nRBC: 0 % (ref 0.0–0.2)

## 2023-01-10 LAB — C DIFFICILE QUICK SCREEN W PCR REFLEX
C Diff antigen: NEGATIVE
C Diff interpretation: NOT DETECTED
C Diff toxin: NEGATIVE

## 2023-01-10 LAB — HEMOGLOBIN AND HEMATOCRIT, BLOOD
HCT: 35.1 % — ABNORMAL LOW (ref 36.0–46.0)
Hemoglobin: 11.2 g/dL — ABNORMAL LOW (ref 12.0–15.0)

## 2023-01-10 LAB — BASIC METABOLIC PANEL
Anion gap: 11 (ref 5–15)
CO2: 25 mmol/L (ref 22–32)
Calcium: 8.2 mg/dL — ABNORMAL LOW (ref 8.9–10.3)
Chloride: 98 mmol/L (ref 98–111)
Creatinine, Ser: 0.75 mg/dL (ref 0.44–1.00)
Glucose, Bld: 135 mg/dL — ABNORMAL HIGH (ref 70–99)
Potassium: 3.1 mmol/L — ABNORMAL LOW (ref 3.5–5.1)
Sodium: 134 mmol/L — ABNORMAL LOW (ref 135–145)

## 2023-01-10 LAB — TECHNOLOGIST SMEAR REVIEW

## 2023-01-10 LAB — ANTITHROMBIN III: AntiThromb III Func: 100 % (ref 75–120)

## 2023-01-10 LAB — HEPARIN LEVEL (UNFRACTIONATED)
Heparin Unfractionated: <0.1 IU/mL — ABNORMAL LOW (ref 0.30–0.70)
Heparin Unfractionated: 0.3 IU/mL (ref 0.30–0.70)

## 2023-01-10 MED ORDER — POTASSIUM CHLORIDE CRYS ER 20 MEQ PO TBCR: 40.0000 meq | EXTENDED_RELEASE_TABLET | Freq: Two times a day (BID) | ORAL | Status: DC

## 2023-01-10 MED ORDER — METRONIDAZOLE 500 MG/100ML IV SOLN: 500.0000 mg | Freq: Two times a day (BID) | INTRAVENOUS | Status: DC

## 2023-01-10 MED ORDER — CIPROFLOXACIN IN D5W 400 MG/200ML IV SOLN
400.0000 mg | Freq: Two times a day (BID) | INTRAVENOUS | Status: DC
  Filled 2023-01-10: qty 200

## 2023-01-10 MED ORDER — POTASSIUM CHLORIDE 20 MEQ PO PACK
40.0000 meq | PACK | Freq: Two times a day (BID) | ORAL | Status: AC
  Administered 2023-01-10 (×2): 40 meq via ORAL
  Filled 2023-01-10 (×2): qty 2

## 2023-01-10 MED ORDER — CIPROFLOXACIN IN D5W 400 MG/200ML IV SOLN
400.0000 mg | Freq: Two times a day (BID) | INTRAVENOUS | Status: DC
  Administered 2023-01-10 – 2023-01-12 (×4): 400 mg via INTRAVENOUS
  Filled 2023-01-10 (×4): qty 200

## 2023-01-10 MED ORDER — METRONIDAZOLE 500 MG/100ML IV SOLN
500.0000 mg | Freq: Two times a day (BID) | INTRAVENOUS | Status: DC
  Administered 2023-01-10 – 2023-01-13 (×6): 500 mg via INTRAVENOUS
  Filled 2023-01-10 (×6): qty 100

## 2023-01-10 MED ORDER — HEPARIN BOLUS VIA INFUSION
2000.0000 [IU] | Freq: Once | INTRAVENOUS | Status: AC
  Administered 2023-01-10: 2000 [IU] via INTRAVENOUS
  Filled 2023-01-10: qty 2000

## 2023-01-10 NOTE — Plan of Care (Signed)
  Problem: Activity: Goal: Risk for activity intolerance will decrease Outcome: Progressing   Problem: Elimination: Goal: Will not experience complications related to bowel motility Outcome: Progressing   Problem: Safety: Goal: Ability to remain free from injury will improve Outcome: Progressing   

## 2023-01-10 NOTE — Progress Notes (Signed)
ANTICOAGULATION CONSULT NOTE - follow up  Pharmacy Consult for heparin Indication: VTE treatment  Allergies  Allergen Reactions   Onion Other (See Comments)    Excessive sneezing   Shellfish Allergy Shortness Of Breath   Ambien [Zolpidem] Other (See Comments)    "Allergic," per facility   Amoxicillin Other (See Comments)    "Allergic," per facility    Patient Measurements: Height: 5\' 7"  (170.2 cm) Weight: 72 kg (158 lb 11.7 oz) IBW/kg (Calculated) : 61.6 Heparin Dosing Weight: 68kg  Vital Signs: Temp: 99.1 F (37.3 C) (07/25 2113) Temp Source: Oral (07/25 2113) BP: 126/62 (07/25 2113) Pulse Rate: 83 (07/25 2113)  Labs: Recent Labs    01/09/23 1900 01/10/23 0329 01/10/23 1116 01/10/23 1703 01/10/23 2107  HGB 12.9 11.6*  --  11.2*  --   HCT 40.3 36.4  --  35.1*  --   PLT 196 142*  --   --   --   HEPARINUNFRC  --   --  <0.10*  --  0.30  CREATININE 0.66 0.75  --   --   --     Estimated Creatinine Clearance: 43.6 mL/min (by C-G formula based on SCr of 0.75 mg/dL).   Medical History: Past Medical History:  Diagnosis Date   Arthritis    Closed left femoral fracture (HCC) 2012   s/p ORIF, Dr. Ave Filter   Closed left radial fracture 2012   s/p reduction   Complication of anesthesia 08/14/2016   not able to think clear for a long time afterwards   Essential hypertension    Hyperlipidemia    Hypothyroidism      Assessment: 89 y.o. female with medical history significant of hypothyroidism, hypertension, hyperlipidemia who presented to the emergency department with hematochezia and abdominal pain. Hematochezia most likely secondary to ischemic colitis, IMV clots noted on CT, pharmacy to dose heparin, no prior AC noted.  Heparin level today is undetectable at <0.10, on 1000 units/hr. Hgb 11.6, plt 142--stable. No line issues or signs/symptoms of bleeding per nursing.  2107 HL 0.3 low end of therapeutic, hgb 11.2 Per RN pt continues to have bloody BMs  Goal of  Therapy:  Heparin level 0.3-0.7 units/ml Monitor platelets by anticoagulation protocol: Yes   Plan: Increase heparin drip to 1300 units/hr Heparin level in 8 hours Daily CBC  Arley Phenix RPh 01/10/2023, 9:58 PM

## 2023-01-10 NOTE — Progress Notes (Signed)
ANTICOAGULATION CONSULT NOTE - Initial Consult  Pharmacy Consult for heparin Indication: VTE treatment  Allergies  Allergen Reactions   Onion Other (See Comments)    Excessive sneezing   Shellfish Allergy Shortness Of Breath   Ambien [Zolpidem] Other (See Comments)    "Allergic," per facility   Amoxicillin Other (See Comments)    "Allergic," per facility    Patient Measurements: Height: 5\' 7"  (170.2 cm) Weight: 72 kg (158 lb 11.7 oz) IBW/kg (Calculated) : 61.6 Heparin Dosing Weight: 68kg  Vital Signs: Temp: 98 F (36.7 C) (07/25 1105) Temp Source: Oral (07/25 1105) BP: 128/62 (07/25 1105) Pulse Rate: 72 (07/25 1105)  Labs: Recent Labs    01/09/23 1900 01/10/23 0329 01/10/23 1116  HGB 12.9 11.6*  --   HCT 40.3 36.4  --   PLT 196 142*  --   HEPARINUNFRC  --   --  <0.10*  CREATININE 0.66 0.75  --     Estimated Creatinine Clearance: 43.6 mL/min (by C-G formula based on SCr of 0.75 mg/dL).   Medical History: Past Medical History:  Diagnosis Date   Arthritis    Closed left femoral fracture (HCC) 2012   s/p ORIF, Dr. Ave Filter   Closed left radial fracture 2012   s/p reduction   Complication of anesthesia 08/14/2016   not able to think clear for a long time afterwards   Essential hypertension    Hyperlipidemia    Hypothyroidism      Assessment: 89 y.o. female with medical history significant of hypothyroidism, hypertension, hyperlipidemia who presented to the emergency department with hematochezia and abdominal pain. Hematochezia most likely secondary to ischemic colitis, IMV clots noted on CT, pharmacy to dose heparin, no prior AC noted.  Heparin level today is undetectable at <0.10, on 1000 units/hr. Hgb 11.6, plt 142--stable. No line issues or signs/symptoms of bleeding per nursing.  Goal of Therapy:  Heparin level 0.3-0.7 units/ml Monitor platelets by anticoagulation protocol: Yes   Plan: Heparin bolus 2000 units x 1 Increase heparin drip to 1200  units/hr Heparin level in 8 hours Daily CBC   Cherylin Mylar, PharmD Clinical Pharmacist  7/25/202412:03 PM

## 2023-01-10 NOTE — Consult Note (Signed)
New Hematology/Oncology Consult   Requesting ZO:XWRUEA Akula        Reason for Consult: SMV thrombosis  HPI: Amber Bryant has a history of diverticulitis in 2022.  She was seen in emergency room earlier this month with rectal bleeding and abdominal pain.  A CT revealed edema in the sigmoid and descending colon suspicious for colitis.  She was treated with antibiotics.  She returned to the emergency room yesterday with loose and bloody stool.  She reports anorexia.  She cannot be specific with the time course of her symptoms.  A CT abdomen/pelvis revealed healed diffuse colonic wall thickening and mucosal hyperenhancement with an inflammatory stranding, progressive from 12/25/2022.  New thrombus is noted in the superior mesenteric vein with severe narrowing and near occlusion.  Additional thrombus is noted in the inferior mesenteric vein.  No lymphadenopathy.   Past Medical History:  Diagnosis Date   Arthritis    Closed left femoral fracture (HCC) 2012   s/p ORIF, Dr. Ave Filter   Closed left radial fracture 2012   s/p reduction   Complication of anesthesia 08/14/2016   not able to think clear for a long time afterwards   Essential hypertension    Hyperlipidemia    Hypothyroidism   G0, P0   Past Surgical History:  Procedure Laterality Date   ABDOMINAL HYSTERECTOMY     CATARACT EXTRACTION W/ INTRAOCULAR LENS  IMPLANT, BILATERAL Bilateral 2014   COLONOSCOPY     ORIF FEMUR FRACTURE Left 04/08/2011   Dr. Ave Filter   TOTAL HIP ARTHROPLASTY Left 08/20/2016   Procedure: TOTAL HIP ARTHROPLASTY POSTERIOR REMOVE TROCH NAIL;  Surgeon: Gean Birchwood, MD;  Location: [facility] OR;  Service: Orthopedics;  Laterality: Left;  :   Current Facility-Administered Medications:    acetaminophen (TYLENOL) tablet 650 mg, 650 mg, Oral, Q6H PRN **OR** acetaminophen (TYLENOL) suppository 650 mg, 650 mg, Rectal, Q6H PRN, Dorrell, Robert, MD   ciprofloxacin (CIPRO) IVPB 400 mg, 400 mg, Intravenous, Q12H, Kathlen Mody, MD, Last Rate: 200 mL/hr at 01/10/23 1539, Infusion Verify at 01/10/23 1539   heparin ADULT infusion 100 units/mL (25000 units/256mL), 1,200 Units/hr, Intravenous, Continuous, Cherylin Mylar, [facility], Last Rate: 12 mL/hr at 01/10/23 1539, 1,200 Units/hr at 01/10/23 1539   lactated ringers infusion, , Intravenous, Continuous, Dorrell, Robert, MD, Last Rate: 100 mL/hr at 01/10/23 1539, Infusion Verify at 01/10/23 1539   levothyroxine (SYNTHROID) tablet 25 mcg, 25 mcg, Oral, Q0600, Alan Mulder, MD, 25 mcg at 01/10/23 5409   metroNIDAZOLE (FLAGYL) IVPB 500 mg, 500 mg, Intravenous, Q12H, Kathlen Mody, MD   oxyCODONE (Oxy IR/ROXICODONE) immediate release tablet 5 mg, 5 mg, Oral, Q4H PRN, Dorrell, Robert, MD   potassium chloride (KLOR-CON) packet 40 mEq, 40 mEq, Oral, BID, Otho Bellows, RPH, 40 mEq at 01/10/23 1121:   levothyroxine  25 mcg Oral Q0600   potassium chloride  40 mEq Oral BID  :   Allergies  Allergen Reactions   Onion Other (See Comments)    Excessive sneezing   Shellfish Allergy Shortness Of Breath   Ambien [Zolpidem] Other (See Comments)    "Allergic," per facility   Amoxicillin Other (See Comments)    "Allergic," per facility  :  FH: Mother had colon cancer  SOCIAL HISTORY: She lives in assisted living at [facility].  She does not use cigarettes or alcohol.  She is a Oceanographer.  No risk factor for HIV or hepatitis.  Review of Systems:  Positives include: Anorexia, diarrhea, rectal bleeding  A complete ROS was otherwise negative.  Physical Exam:  Blood pressure 127/64, pulse 84, temperature 98.6 F (37 C), temperature source Oral, resp. rate 15, height 5\' 7"  (1.702 m), weight 158 lb 11.7 oz (72 kg), SpO2 99%.  HEENT: The mouth is dry, poor dentition, gum hypertrophy Lungs: Clear bilaterally, no respiratory distress Cardiac: Regular rate and rhythm, 2/6 systolic murmur Abdomen: Mild diffuse tenderness, no hepatosplenomegaly, no mass  Vascular: No leg  edema Lymph nodes: No cervical, supraclavicular, axillary, or inguinal nodes Neurologic: Alert, follows commands, moves all extremities to command Skin: No rash  LABS:   Recent Labs    01/09/23 1900 01/10/23 0329  WBC 11.9* 12.8*  HGB 12.9 11.6*  HCT 40.3 36.4  PLT 196 142*   Blood smear from 01/10/2023: The platelets appear normal in number.  Numerous burr cells, few ovalocytes, mild increase in polychromasia.  The majority the white cells are mature appearing neutrophils.  Increased neutrophil granulation.  Numerous band forms.  Rare myelocyte.  No blasts.  No monotonous white cell population.  Recent Labs    01/09/23 1900 01/10/23 0329  NA 134* 134*  K 3.1* 3.1*  CL 96* 98  CO2 25 25  GLUCOSE 125* 135*  BUN 13 10  CREATININE 0.66 0.75  CALCIUM 8.6* 8.2*      RADIOLOGY:  CT ABDOMEN PELVIS W CONTRAST  Result Date: 01/09/2023 CLINICAL DATA:  Weakness, abdominal pain, loose/bloody stools EXAM: CT ABDOMEN AND PELVIS WITH CONTRAST TECHNIQUE: Multidetector CT imaging of the abdomen and pelvis was performed using the standard protocol following bolus administration of intravenous contrast. RADIATION DOSE REDUCTION: This exam was performed according to the departmental dose-optimization program which includes automated exposure control, adjustment of the mA and/or kV according to patient size and/or use of iterative reconstruction technique. CONTRAST:  OMNIPAQUE IOHEXOL 300 MG/ML  SOLN COMPARISON:  CT 12/25/2022 FINDINGS: Lower chest: No acute abnormality. Hepatobiliary: Cholelithiasis. No focal hepatic lesion. No evidence of cholecystitis. No biliary dilation. Pancreas: Unremarkable. Spleen: Unremarkable. Adrenals/Urinary Tract: Stable adrenal glands. Low-attenuation lesions in the kidneys are statistically likely to represent cysts. No follow-up is required. No urinary calculi or hydronephrosis. Unremarkable bladder. Stomach/Bowel: Diffuse colonic wall thickening and mucosal  hyperenhancement with adjacent pericolonic inflammatory stranding. This has progressed from 12/25/2022. No evidence of obstruction. Stomach is within normal limits. Vascular/Lymphatic: New thrombus within the superior mesenteric vein causes severe narrowing and near occlusion (series 2/image 29). There is additional thrombus within the inferior mesenteric vein causing severe narrowing (series 2/image 26. Splenic and portal veins are patent. No lymphadenopathy by size. Reproductive: No acute abnormality. Other: Trace free fluid in the pelvis and right paracolic gutter. No free intraperitoneal air. Musculoskeletal: No acute fracture.  Left THA. IMPRESSION: 1. 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 2. Pancolitis, presumably infectious/inflammatory and progressed from 12/25/2022. These results were called by telephone at the time of interpretation on 01/09/2023 at 8:47 pm to provider [facility] , who verbally acknowledged these results. Electronically Signed   By: Minerva Fester M.D.   On: 01/09/2023 20:48   CT ABDOMEN PELVIS W CONTRAST  Result Date: 12/25/2022 CLINICAL DATA:  Abdominal pain and rectal bleeding. EXAM: CT ABDOMEN AND PELVIS WITH CONTRAST TECHNIQUE: Multidetector CT imaging of the abdomen and pelvis was performed using the standard protocol following bolus administration of intravenous contrast. RADIATION DOSE REDUCTION: This exam was performed according to the departmental dose-optimization program which includes automated exposure control, adjustment of the mA and/or kV according to patient size and/or use of iterative reconstruction  technique. CONTRAST:  80mL OMNIPAQUE IOHEXOL 300 MG/ML  SOLN COMPARISON:  07/29/2020 FINDINGS: Lower chest: No basilar airspace disease or pleural effusion. Hepatobiliary: Tiny cyst in the subcapsular right lobe of the liver. No suspicious liver lesion. Layering gallstones within physiologically distended gallbladder. No biliary dilatation.  Pancreas: 7 mm cyst in the pancreatic tail, series 3, image 26. No ductal dilatation or inflammation. Spleen: Normal in size without focal abnormality. Adrenals/Urinary Tract: Normal adrenal glands. No hydronephrosis. There are bilateral parapelvic cysts. 9 cm cortical cyst arises from the upper pole of the right kidney. No specific imaging follow-up is recommended. No evidence of solid lesion. The urinary bladder is partially distended, normal for degree of distension. Stomach/Bowel: There is mild wall thickening with pericolonic edema throughout the sigmoid colon, for example series 3 images 57 and 58. Minimal wall thickening is also seen involving the distal descending colon, for example series 3, image 39. multiple colonic diverticula, although length of colonic involvement favors colitis over diverticulitis. There is moderate colonic stool burden proximally. Normal appendix potentially visualized. The stomach is nondistended. There is no small bowel inflammation. Vascular/Lymphatic: Aortic atherosclerosis without aneurysm. 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 Distal branches are patent. The portal vein is patent. Increased number of lymph nodes adjacent to the inferior mesenteric vessels as well as adjacent to inflamed sigmoid colon, likely reactive. Reproductive: Status post hysterectomy. No adnexal masses. Other: No free air or perforation. No significant ascites. No focal fluid collection. No abdominal wall hernia. Musculoskeletal: Left hip arthroplasty. Scoliosis and degenerative change in the spine. IMPRESSION: 1. Mild wall thickening with pericolonic edema throughout the sigmoid colon and to a lesser extent the distal descending colon, suspicious for colitis. There are multiple colonic diverticula, although length of colonic involvement favors colitis over diverticulitis. 2. Increased number of lymph nodes adjacent to the inferior mesenteric  vessels as well as adjacent to inflamed sigmoid colon, likely reactive. 3. Cholelithiasis without CT findings of acute cholecystitis. 4. A 7 mm cyst in the pancreatic tail. Per consensus guidelines call in a patient of this age for a cyst of this size, the decision to pursue imaging follow-up should be anchored to the patient's overall health and preferences, such workup is only advised if the patient is a surgical candidate. Follow-up imaging in 2 years can be considered. Aortic Atherosclerosis (ICD10-I70.0). Electronically Signed   By: Narda Rutherford M.D.   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  Colitis-pancolitis on CT 01/09/2023, progressive from CT 12/25/2022 3.  Diverticulitis February 2022 4.  Anorexia/weight loss 5.  Mild anemia secondary to GI bleeding, anorexia? 6.  Leukocytosis 7.  Hypertension 8.  Hypercholesterolemia  Amber Bryant is admitted with clinical and radiologic evidence of colitis.  She most likely has ischemic colitis.  Is unclear whether the SMV thrombosis/ischemic colitis is the primary event or related to an episode of acute diverticulitis.  She does not have known risk factors for SMV thrombosis such as cirrhosis, recent abdominal surgery, history of an underlying hypercoagulation syndrome, or a known myeloproliferative disorder.  I have a low clinical suspicion for a myeloproliferative disorder.  The leukocytosis and Red cell findings on the blood smear are most likely reactive to the current inflammatory process and anorexia.  I agree with anticoagulation therapy.  I am concerned she could have  an underlying malignancy, but her history, physical examination, and imaging to date does not suggest a tumor site.  I reviewed the abdomen/pelvis CT images and was concerned there could be a pancreas body mass, but upon further  review with radiology they feel there is a cyst in the proximal pancreas body.  Recommendations: Continue anticoagulation therapy, convert to a DOAC when bleeding improves and there is no plan for procedures Follow-up results from hypercoagulation panel Consider additional diagnostic evaluation for malignancy CT angiogram     Thornton Papas, MD 01/10/2023, 4:38 PM

## 2023-01-10 NOTE — TOC Progression Note (Deleted)
Transition of Care [facility]) - Progression Note    Patient Details  Name: Anjali Manzella MRN: [id number] Date of Birth: [birth year]  Transition [facility]) CM/SW Contact  Amada Jupiter, LCSW Phone Number: 01/10/2023, 12:18 PM  Clinical Narrative:     Met with pt yesterday per his request to discuss his concerns about his anticipated long length of time he will be unable to work.  Asking about disability application.  I have reached out to our financial counseling dept to see if there could be any assistance with completing SSD application and will provide pt with information on how he can do this on his own.       Expected Discharge Plan and Services                                               Social Determinants of Health (SDOH) Interventions SDOH Screenings   Food Insecurity: No Food Insecurity (01/10/2023)  Housing: Low Risk  (01/10/2023)  Transportation Needs: No Transportation Needs (01/10/2023)  Utilities: Not At Risk (01/10/2023)  Alcohol Screen: Low Risk  (05/21/2018)  Depression (PHQ2-9): Low Risk  (12/25/2022)  Tobacco Use: Low Risk  (01/09/2023)    Readmission Risk Interventions     No data to display

## 2023-01-10 NOTE — TOC Initial Note (Signed)
Transition of Care [facility]) - Initial/Assessment Note    Patient Details  Name: Amber Bryant MRN: [id number] Date of Birth: Aug 11, 1928  Transition of Care [facility]) CM/SW Contact:    Amada Jupiter, LCSW Phone Number: 01/10/2023, 12:35 PM  Clinical Narrative:                  Met with pt in room while her daughter, [name], on speaker phone.  Reviewed anticipated dc planning needs.  Both confirm that pt had admitted to [facility] from SNF level at [facility].  Their anticipated dc plan is for her to return there when medically cleared.  Have confirmed plan with [facility] as well.  TOC will follow along and assist with pt's return when cleared.   Expected Discharge Plan: Skilled Nursing Facility Barriers to Discharge: Continued Medical Work up   Patient Goals and CMS Choice Patient states their goals for this hospitalization and ongoing recovery are:: return to SNF at [facility]          Expected Discharge Plan and Services In-house Referral: Clinical Social Work   Post Acute Care Choice: Skilled Nursing Facility Living arrangements for the past 2 months: Skilled Nursing Facility                 DME Arranged: N/A DME Agency: NA                  Prior Living Arrangements/Services Living arrangements for the past 2 months: Skilled Nursing Facility Lives with:: Facility Resident Patient language and need for interpreter reviewed:: Yes Do you feel safe going back to the place where you live?: Yes      Need for Family Participation in Patient Care: No (Comment) Care giver support system in place?: Yes (comment)   Criminal Activity/Legal Involvement Pertinent to Current Situation/Hospitalization: No - Comment as needed  Activities of Daily Living Home Assistive Devices/Equipment: None ADL Screening (condition at time of admission) Patient's cognitive ability adequate to safely complete daily activities?: Yes Is the patient deaf or have difficulty hearing?: Yes Does the patient have  difficulty seeing, even when wearing glasses/contacts?: No Does the patient have difficulty concentrating, remembering, or making decisions?: No Patient able to express need for assistance with ADLs?: Yes Does the patient have difficulty dressing or bathing?: No Independently performs ADLs?: Yes (appropriate for developmental age) Does the patient have difficulty walking or climbing stairs?: No Weakness of Legs: None Weakness of Arms/Hands: None  Permission Sought/Granted Permission sought to share information with : Family Supports, Oceanographer granted to share information with : Yes, Verbal Permission Granted  Share Information with NAME: daughter, Jillyn Ledger @ [phone]  Permission granted to share info w AGENCY: [facility]        Emotional Assessment Appearance:: Appears stated age Attitude/Demeanor/Rapport: Gracious Affect (typically observed): Accepting Orientation: : Oriented to Self, Oriented to Place, Oriented to  Time, Oriented to Situation Alcohol / Substance Use: Not Applicable Psych Involvement: No (comment)  Admission diagnosis:  Acute blood loss anemia [D62] Colitis [K52.9] 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] Patient Active Problem List   Diagnosis Date Noted   Acute blood loss anemia 01/09/2023   Diverticulitis 08/19/2020   Chronic venous insufficiency 11/13/2017   Overweight (BMI 25.0-29.9) 11/13/2017   Senile osteoporosis 08/28/2016   Constipation 08/27/2016   Arthritis of right hip 08/20/2016   Traumatic arthritis of hip, left 08/16/2016   Hypokalemia 04/21/2016   Vitamin D deficiency 04/21/2016   Hypothyroidism    Hyperlipidemia  Primary hypertension    PULMONARY NODULE 11/25/2007   PCP:  Mahlon Gammon, MD Pharmacy:   [facility] - [place], [place] - [phone] [place] [address] Phone: [phone] Fax:  [phone]     Social Determinants of Health (SDOH) Social History: SDOH Screenings   Food Insecurity: No Food Insecurity (01/10/2023)  Housing: Low Risk  (01/10/2023)  Transportation Needs: No Transportation Needs (01/10/2023)  Utilities: Not At Risk (01/10/2023)  Alcohol Screen: Low Risk  (05/21/2018)  Depression (PHQ2-9): Low Risk  (12/25/2022)  Tobacco Use: Low Risk  (01/09/2023)   SDOH Interventions:     Readmission Risk Interventions    01/10/2023   12:32 PM  Readmission Risk Prevention Plan  Post Dischage Appt Complete  Medication Screening Complete  Transportation Screening Complete

## 2023-01-10 NOTE — Progress Notes (Signed)
[facility]                                                                               Amber Bryant, is a 89 y.o. female, DOB - June 29, 1928, GEX:[id number] Admit date - 01/09/2023    Outpatient Primary MD for the patient is Mahlon Gammon, MD  LOS - 1  days    Brief summary   89 y.o. female with medical history significant of hypothyroidism, hypertension, hyperlipidemia , dementia, who presented to the emergency department with hematochezia and abdominal pain.  Patient was seen several weeks ago and diagnosed with colitis and discharged on outpatient regimen.  Patient states that since discharge she has had persistent blood in her stool and intermittent abdominal pain.    Assessment & Plan    Assessment and Plan:   Hematochezia  with pancolitis  and SMA/IMA thrombus on CT abd and pelvis Differential include ischemic and infectious colitis. Completed outpatient treatment with antibiotics.  Mild drop in hemoglobin.  Started her on IV heparin and empirically to be started on IV antibiotics. Started on clears and continue the same.  Vascular surgery consulted by EDP, recommended anticoagulation.  GI on board and recommended hematology evaluation.  Hypercoagulable work up ordered.  GI panel and C diff ordered. Once the stool studies are sent, plan to continue ciprofloxacin and flagyl.     Leukocytosis:  Reactive vs infection. Monitor.   Hypokalemia; replaced.  Repeat in am.     Mild anemia :  Monitor.      Estimated body mass index is 24.86 kg/m as calculated from the following:   Height as of this encounter: 5\' 7"  (1.702 m).   Weight as of this encounter: 72 kg.  Code Status: full code.  DVT Prophylaxis:  heparin bolus via infusion 2,000 Units Start: 01/10/23 1300 SCDs Start: 01/09/23 2254   Level of Care: Level of care: Med-Surg Family Communication: none at bedside.   Disposition Plan:     Remains inpatient appropriate:  pending further  evaluation.   Procedures:  CT abd and pelvis 1. 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 2. Pancolitis, presumably infectious/inflammatory and progressed from 12/25/2022.    Consultants:   Gastroenterology  Curb side with vascular  Hematology.   Antimicrobials:   Anti-infectives (From admission, onward)    Start     Dose/Rate Route Frequency Ordered Stop   01/10/23 1500  ciprofloxacin (CIPRO) IVPB 400 mg        400 mg 200 mL/hr over 60 Minutes Intravenous Every 12 hours 01/10/23 1213     01/10/23 1500  metroNIDAZOLE (FLAGYL) IVPB 500 mg        500 mg 100 mL/hr over 60 Minutes Intravenous Every 12 hours 01/10/23 1213     01/10/23 1300  metroNIDAZOLE (FLAGYL) IVPB 500 mg  Status:  Discontinued        500 mg 100 mL/hr over 60 Minutes Intravenous Every 12 hours 01/10/23 1136 01/10/23 1213   01/10/23 1230  ciprofloxacin (CIPRO) IVPB 400 mg  Status:  Discontinued        400 mg 200 mL/hr over 60 Minutes Intravenous Every 12 hours 01/10/23 1135  01/10/23 1213   01/09/23 2100  cefTRIAXone (ROCEPHIN) 2 g in sodium chloride 0.9 % 100 mL IVPB       Placed in "And" Linked Group   2 g 200 mL/hr over 30 Minutes Intravenous  Once 01/09/23 2053 01/09/23 2208   01/09/23 2100  metroNIDAZOLE (FLAGYL) IVPB 500 mg       Placed in "And" Linked Group   500 mg 100 mL/hr over 60 Minutes Intravenous  Once 01/09/23 2053 01/09/23 2324        Medications  Scheduled Meds:  heparin  2,000 Units Intravenous Once   levothyroxine  25 mcg Oral Q0600   potassium chloride  40 mEq Oral BID   Continuous Infusions:  ciprofloxacin     heparin 1,200 Units/hr (01/10/23 1243)   lactated ringers 100 mL/hr at 01/09/23 2324   metronidazole     PRN Meds:.acetaminophen **OR** acetaminophen, oxyCODONE    Subjective:   Amber Bryant was seen and examined today.  Persistent abdominal pain.   Objective:   Vitals:   01/10/23 0007 01/10/23 0027 01/10/23 0558 01/10/23 1105  BP:  122/69  133/71 128/62  Pulse:  78 81 72  Resp:  17 17 16   Temp:  98.2 F (36.8 C) 98.3 F (36.8 C) 98 F (36.7 C)  TempSrc:  Oral Oral Oral  SpO2:  97% 96% 97%  Weight: 72 kg     Height: 5\' 7"  (1.702 m)       Intake/Output Summary (Last 24 hours) at 01/10/2023 1243 Last data filed at 01/10/2023 0500 Gross per 24 hour  Intake 1900 ml  Output 700 ml  Net 1200 ml   Filed Weights   01/09/23 1802 01/10/23 0007  Weight: 68 kg 72 kg     Exam General exam: Appears calm and comfortable  Respiratory system: Clear to auscultation. Respiratory effort normal. Cardiovascular system: S1 & S2 heard, RRR. No JVD, Gastrointestinal system: Abdomen is soft, tender minimal bowel sounds.  Central nervous system: Alert and oriented to place and person.  Extremities: Symmetric 5 x 5 power. Skin: No rashes, lesions or ulcers Psychiatry:  pleasantly confused.     Data Reviewed:  I have personally reviewed following labs and imaging studies   CBC Lab Results  Component Value Date   WBC 12.8 (H) 01/10/2023   RBC 3.86 (L) 01/10/2023   HGB 11.6 (L) 01/10/2023   HCT 36.4 01/10/2023   MCV 94.3 01/10/2023   MCH 30.1 01/10/2023   PLT 142 (L) 01/10/2023   MCHC 31.9 01/10/2023   RDW 13.8 01/10/2023   LYMPHSABS 1.0 01/09/2023   MONOABS 0.9 01/09/2023   EOSABS 0.0 01/09/2023   BASOSABS 0.0 01/09/2023     Last metabolic panel Lab Results  Component Value Date   NA 134 (L) 01/10/2023   K 3.1 (L) 01/10/2023   CL 98 01/10/2023   CO2 25 01/10/2023   BUN 10 01/10/2023   CREATININE 0.75 01/10/2023   GLUCOSE 135 (H) 01/10/2023   GFRNONAA >60 01/10/2023   GFRAA >60 08/14/2016   CALCIUM 8.2 (L) 01/10/2023   PROT 6.8 01/09/2023   ALBUMIN 2.8 (L) 01/09/2023   BILITOT 0.8 01/09/2023   ALKPHOS 104 01/09/2023   AST 20 01/09/2023   ALT 12 01/09/2023   ANIONGAP 11 01/10/2023    CBG (last 3)  No results for input(s): "GLUCAP" in the last 72 hours.    Coagulation Profile: No results for input(s):  "INR", "PROTIME" in the last 168 hours.   Radiology Studies: CT  ABDOMEN PELVIS W CONTRAST  Result Date: 01/09/2023 CLINICAL DATA:  Weakness, abdominal pain, loose/bloody stools EXAM: CT ABDOMEN AND PELVIS WITH CONTRAST TECHNIQUE: Multidetector CT imaging of the abdomen and pelvis was performed using the standard protocol following bolus administration of intravenous contrast. RADIATION DOSE REDUCTION: This exam was performed according to the departmental dose-optimization program which includes automated exposure control, adjustment of the mA and/or kV according to patient size and/or use of iterative reconstruction technique. CONTRAST:  OMNIPAQUE IOHEXOL 300 MG/ML  SOLN COMPARISON:  CT 12/25/2022 FINDINGS: Lower chest: No acute abnormality. Hepatobiliary: Cholelithiasis. No focal hepatic lesion. No evidence of cholecystitis. No biliary dilation. Pancreas: Unremarkable. Spleen: Unremarkable. Adrenals/Urinary Tract: Stable adrenal glands. Low-attenuation lesions in the kidneys are statistically likely to represent cysts. No follow-up is required. No urinary calculi or hydronephrosis. Unremarkable bladder. Stomach/Bowel: Diffuse colonic wall thickening and mucosal hyperenhancement with adjacent pericolonic inflammatory stranding. This has progressed from 12/25/2022. No evidence of obstruction. Stomach is within normal limits. Vascular/Lymphatic: New thrombus within the superior mesenteric vein causes severe narrowing and near occlusion (series 2/image 29). There is additional thrombus within the inferior mesenteric vein causing severe narrowing (series 2/image 26. Splenic and portal veins are patent. No lymphadenopathy by size. Reproductive: No acute abnormality. Other: Trace free fluid in the pelvis and right paracolic gutter. No free intraperitoneal air. Musculoskeletal: No acute fracture.  Left THA. IMPRESSION: 1. 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 2. Pancolitis, presumably  infectious/inflammatory and progressed from 12/25/2022. These results were called by telephone at the time of interpretation on 01/09/2023 at 8:47 pm to provider [facility] , who verbally acknowledged these results. Electronically Signed   By: Minerva Fester M.D.   On: 01/09/2023 20:48       Kathlen Mody M.D. [facility] 01/10/2023, 12:43 PM  Available via Epic secure chat 7am-7pm After 7 pm, please refer to night coverage provider listed on amion.

## 2023-01-10 NOTE — Consult Note (Addendum)
Consultation Note   Referring Provider:  [facility] PCP: Mahlon Gammon, MD Primary Gastroenterologist: Marsa Aris, MD        Reason for Consultation: possible ischemic colitis  DOA: 01/09/2023         Hospital Day: 2   ASSESSMENT & PLAN   Patient is a 89 y.o. year old female with a past medical history of ischemic colitis, dementia, OA, HTN, hypothyroidism, cholelithiasis   Loose stool / hematochezia with  pancolitis on CT scan (progressed from only left sided colitis on CT scan on 12/25/22). Also new thrombus in SMV and IMV causing near occlusion. DDx:  Ischemic colitis or infectious colitis.  --Etiology of thrombus? malignancy not excluded. Recommend Hematology consult . Vascular Surgery has recommended anti-coagulation (indefinitely). Currently on a heparin gtt --Will obtain CT angio  --Need to rule out infectious colitis (though probably this is ischemic colitis) Will obtain GI pathogen panel and C-diff --After collection of stool studies will start Cipro and flagyl  Pancreatic tail cyst measuring 7 mm on CT scan.   See PMH for additional medical history    Attending Physician Note   I have taken a history, reviewed the chart and examined the patient. I performed more than 50% of this encounter in conjunction with the APP. I agree with the APP's note, impression and recommendations with my edits. My additional impressions and recommendations are as follows.   Left-sided colitis progressing to pancolitis on cross-sectional imaging. New SMV and IMV thrombosis. Suspected ischemic colitis. R/O infectious colitis, mesenteric artery stenoses, occult maligancy.   Currently on heparin gtt  GI stool panel and C diff Cipro and Flagyl after stool studies collected  CTA Hematology consultation   Amber Head, MD [facility] See Loretha Stapler, [facility], for our on call provider     Pertinent GI History   Patient seen by [place] in Feb 2022  for abdominal pain and diarrhea with blood. At that time CT w contrast 07/29/20 showed severe descending and sigmoid diverticulosis with diffuse long segment wall thickening of the sigmoid, consistent with acute and/or chronic diverticulitis in addition to other nonspecific etiologies of infectious, inflammatory, or ischemic colitis. She was treated with Cipro / Flagyl and improved overall.   HISTORY OF PRESENT ILLNESS   Amber Bryant was seen in the ED earlier this month for rectal bleeding and abdominal pain. CT showed mild wall thickening with pericolonic edema throughout the sigmoid colon and to a lesser extent the distal descending colon suspicious for colitis.  Prescribed cipro and flagyl. Symptoms improved though she cannot remember if she took the antibiotics  She returned to ED yesterday from [facility]. She is struggling to remember details but says she came to ED due to loose stools with blood. She says that loose stool in not normal for her. She denies any significant abdominal pain.She hasn't had an appetite lately and reports a several pound weight loss. She has not abdominal pain or nausea related to eating, just doesn't care to eat. . CT scan with progressive bowel inflammation showing pancolitis, new thrombus within the SMV and IMV   ED / Admission labs notable for   --WBC 11.9 --Hgb 12.9 ( at baseline) --Platelets 142 --K+ 3.1 --Albumin 2.8 --CT AP w  contrast -  Pancolitis, presumably infectious/inflammatory and progressed from 12/25/2022.  New thrombus within the superior mesenteric vein causing severe narrowing and near occlusion. There is additional thrombus within the inferior mesenteric vein causing severe narrowing. Splenic and portal veins are patent.  Previous GI Evaluations  No colonoscopy found in Epic   Labs and Imaging: Recent Labs    01/09/23 1900 01/10/23 0329  WBC 11.9* 12.8*  HGB 12.9 11.6*  HCT 40.3 36.4  PLT 196 142*   Recent Labs    01/09/23 1900  01/10/23 0329  NA 134* 134*  K 3.1* 3.1*  CL 96* 98  CO2 25 25  GLUCOSE 125* 135*  BUN 13 10  CREATININE 0.66 0.75  CALCIUM 8.6* 8.2*   Recent Labs    01/09/23 1900  PROT 6.8  ALBUMIN 2.8*  AST 20  ALT 12  ALKPHOS 104  BILITOT 0.8   No results for input(s): "HEPBSAG", "HCVAB", "HEPAIGM", "HEPBIGM" in the last 72 hours. No results for input(s): "LABPROT", "INR" in the last 72 hours.    Past Medical History:  Diagnosis Date   Arthritis    Closed left femoral fracture (HCC) 2012   s/p ORIF, Dr. Ave Filter   Closed left radial fracture 2012   s/p reduction   Complication of anesthesia 08/14/2016   not able to think clear for a long time afterwards   Essential hypertension    Hyperlipidemia    Hypothyroidism     Past Surgical History:  Procedure Laterality Date   ABDOMINAL HYSTERECTOMY     CATARACT EXTRACTION W/ INTRAOCULAR LENS  IMPLANT, BILATERAL Bilateral 2014   COLONOSCOPY     ORIF FEMUR FRACTURE Left 04/08/2011   Dr. Ave Filter   TOTAL HIP ARTHROPLASTY Left 08/20/2016   Procedure: TOTAL HIP ARTHROPLASTY POSTERIOR REMOVE TROCH NAIL;  Surgeon: Gean Birchwood, MD;  Location: [facility] OR;  Service: Orthopedics;  Laterality: Left;    Family History  Problem Relation Age of Onset   Colon cancer Mother    Heart disease Father    Esophageal cancer Neg Hx    Stomach cancer Neg Hx    Pancreatic cancer Neg Hx     Prior to Admission medications   Medication Sig Start Date End Date Taking? Authorizing Provider  acetaminophen (TYLENOL) 325 MG tablet Take 650 mg by mouth 3 (three) times daily as needed (for pain).   Yes [provider]  azithromycin (ZITHROMAX) 500 MG tablet Take 1,000 mg by mouth See admin instructions. Take 1,000 mg by mouth one hour prior to ANY dental procedures   Yes [provider]  levothyroxine (SYNTHROID) 25 MCG tablet Take 1 tablet (25 mcg total) by mouth every morning. Patient taking differently: Take 25 mcg by mouth daily before  breakfast. 08/21/21  Yes Mahlon Gammon, MD  omeprazole (PRILOSEC OTC) 20 MG tablet Take 40 mg by mouth daily before supper.   Yes [provider]  ondansetron (ZOFRAN-ODT) 4 MG disintegrating tablet Take 4 mg by mouth every 6 (six) hours as needed for nausea or vomiting.   Yes [provider]  polyethylene glycol (MIRALAX / GLYCOLAX) 17 g packet Take 17 g by mouth daily as needed for mild constipation (to be mixed into 6-8 ounces of fluid).   Yes [provider]  potassium chloride (KLOR-CON) 10 MEQ tablet Take 10 mEq by mouth daily. 01/08/23 01/12/23 Yes [provider]  Cholecalciferol (VITAMIN D3) 50 MCG (2000 UT) TABS Take 2,000 Units by mouth in the morning. Patient not taking:  Reported on 01/09/2023    [provider]  Cholecalciferol 50 MCG (2000 UT) CAPS Take 1 capsule (2,000 Units total) by mouth daily. Patient not taking: Reported on 01/09/2023 07/10/21   Fletcher Anon, NP  hydrochlorothiazide (HYDRODIURIL) 25 MG tablet Take 0.5 tablets (12.5 mg total) by mouth daily. Patient not taking: Reported on 01/09/2023 02/21/22   Mahlon Gammon, MD  lovastatin (MEVACOR) 20 MG tablet TAKE 1 TABLET BY MOUTH EVERY MORNING Patient not taking: Reported on 01/09/2023 01/11/22   Mahlon Gammon, MD  Lutein-Zeaxanthin 25-5 MG CAPS Take 1 capsule by mouth daily at 6 (six) AM. Patient not taking: Reported on 01/09/2023    [provider]  vitamin C (ASCORBIC ACID) 250 MG tablet Take 125 mg by mouth daily. Patient not taking: Reported on 01/09/2023    [provider]    Current Facility-Administered Medications  Medication Dose Route Frequency Provider Last Rate Last Admin   acetaminophen (TYLENOL) tablet 650 mg  650 mg Oral Q6H PRN Dorrell, Robert, MD       Or   acetaminophen (TYLENOL) suppository 650 mg  650 mg Rectal Q6H PRN Dorrell, Robert, MD       heparin ADULT infusion 100 units/mL (25000 units/268mL)  1,000 Units/hr Intravenous Continuous  Maurice March, RPH 10 mL/hr at 01/10/23 0159 1,000 Units/hr at 01/10/23 0159   lactated ringers infusion   Intravenous Continuous Alan Mulder, MD 100 mL/hr at 01/09/23 2324 New Bag at 01/09/23 2324   levothyroxine (SYNTHROID) tablet 25 mcg  25 mcg Oral Q0600 Alan Mulder, MD   25 mcg at 01/10/23 1610   oxyCODONE (Oxy IR/ROXICODONE) immediate release tablet 5 mg  5 mg Oral Q4H PRN Alan Mulder, MD       potassium chloride SA (KLOR-CON M) CR tablet 40 mEq  40 mEq Oral BID Kathlen Mody, MD        Allergies as of 01/09/2023 - Review Complete 01/09/2023  Allergen Reaction Noted   Onion Other (See Comments) 07/16/2022   Shellfish allergy Shortness Of Breath 03/26/2021   Ambien [zolpidem] Other (See Comments) 09/01/2020   Amoxicillin Other (See Comments) 08/08/2016    Social History   Socioeconomic History   Marital status: Widowed    Spouse name: Not on file   Number of children: Not on file   Years of education: Not on file   Highest education level: Not on file  Occupational History   Not on file  Tobacco Use   Smoking status: Never   Smokeless tobacco: Never  Vaping Use   Vaping status: Never Used  Substance and Sexual Activity   Alcohol use: No   Drug use: No   Sexual activity: Never  Other Topics Concern   Not on file  Social History Narrative   Tobacco use, amount per day now: NONE   Past tobacco use, amount per day: NONE   How many years did you use tobacco: NONE   Alcohol use (drinks per week): 1   Diet: ONGOING   Do you drink/eat things with caffeine: YES   Marital status: WIDOWED                       What year were you married? 1956   Do you live in a house, apartment, assisted living, condo, trailer, etc.? INDEPENDENT LIVING ([facility])   Is it one or more stories? ONE   How many persons live in your home? ONE   Do you have pets  in your home?( please list) 3 DOGS   Current or past profession: PUBLISHER   Do you exercise?   YES                                Type and how often? SWIM DAILY   Do you have a living will? YES   Do you have a DNR form?   YES                                If not, do you want to discuss one?   Do you have signed POA/HPOA forms?  YES                      If so, please bring to you appointment   Has 2 adopted children   Social Determinants of Health   Financial Resource Strain: Not on file  Food Insecurity: No Food Insecurity (01/10/2023)   Hunger Vital Sign    Worried About Running Out of Food in the Last Year: Never true    Ran Out of Food in the Last Year: Never true  Transportation Needs: No Transportation Needs (01/10/2023)   PRAPARE - Administrator, Civil Service (Medical): No    Lack of Transportation (Non-Medical): No  Physical Activity: Not on file  Stress: Not on file  Social Connections: Not on file  Intimate Partner Violence: Not At Risk (01/10/2023)   Humiliation, Afraid, Rape, and Kick questionnaire    Fear of Current or Ex-Partner: No    Emotionally Abused: No    Physically Abused: No    Sexually Abused: No     Code Status   Code Status: Full Code  Review of Systems: All systems reviewed and negative except where noted in HPI.  Physical Exam: Vital signs in last 24 hours: Temp:  [98.2 F (36.8 C)-98.4 F (36.9 C)] 98.3 F (36.8 C) (07/25 0558) Pulse Rate:  [77-89] 81 (07/25 0558) Resp:  [12-22] 17 (07/25 0558) BP: (118-135)/(60-75) 133/71 (07/25 0558) SpO2:  [91 %-97 %] 96 % (07/25 0558) Weight:  [01 kg-73 kg] 72 kg (07/25 0007) Last BM Date : 01/09/23  General:  Pleasant female in NAD Psych:  Cooperative. Normal mood and affect Eyes: Pupils equal Ears:  Normal auditory acuity Nose: No deformity, discharge or lesions Neck:  Supple, no masses felt Lungs:  Clear to auscultation.  Heart:  Regular rate, regular rhythm.  Abdomen:  Soft, nondistended, moderate generalized lower abdomina tenderness. Active bowel sounds, no masses felt Rectal :  Deferred Msk:  Symmetrical without gross deformities.  Neurologic:  Alert, oriented, grossly normal neurologically Extremities : No edema Skin:  Intact without significant lesions.    Intake/Output from previous day: 07/24 0701 - 07/25 0700 In: 1900 [P.O.:240; I.V.:560; IV Piggyback:1100] Out: 700 [Urine:700] Intake/Output this shift:  No intake/output data recorded.  Principal Problem:   Acute blood loss anemia    Willette Cluster, NP-C @  01/10/2023, 9:17 AM

## 2023-01-11 ENCOUNTER — Inpatient Hospital Stay (HOSPITAL_COMMUNITY): Payer: Medicare Other

## 2023-01-11 ENCOUNTER — Encounter (HOSPITAL_COMMUNITY): Payer: Self-pay | Admitting: Internal Medicine

## 2023-01-11 DIAGNOSIS — D62 Acute posthemorrhagic anemia: Secondary | ICD-10-CM | POA: Diagnosis not present

## 2023-01-11 DIAGNOSIS — E039 Hypothyroidism, unspecified: Secondary | ICD-10-CM | POA: Diagnosis not present

## 2023-01-11 DIAGNOSIS — D500 Iron deficiency anemia secondary to blood loss (chronic): Secondary | ICD-10-CM

## 2023-01-11 DIAGNOSIS — R933 Abnormal findings on diagnostic imaging of other parts of digestive tract: Secondary | ICD-10-CM

## 2023-01-11 DIAGNOSIS — K529 Noninfective gastroenteritis and colitis, unspecified: Principal | ICD-10-CM

## 2023-01-11 DIAGNOSIS — K922 Gastrointestinal hemorrhage, unspecified: Secondary | ICD-10-CM

## 2023-01-11 DIAGNOSIS — R634 Abnormal weight loss: Secondary | ICD-10-CM | POA: Diagnosis not present

## 2023-01-11 DIAGNOSIS — K55069 Acute infarction of intestine, part and extent unspecified: Secondary | ICD-10-CM | POA: Diagnosis not present

## 2023-01-11 DIAGNOSIS — B370 Candidal stomatitis: Secondary | ICD-10-CM

## 2023-01-11 LAB — BASIC METABOLIC PANEL
Anion gap: 8 (ref 5–15)
BUN: 11 mg/dL (ref 8–23)
CO2: 26 mmol/L (ref 22–32)
Calcium: 7.9 mg/dL — ABNORMAL LOW (ref 8.9–10.3)
Chloride: 100 mmol/L (ref 98–111)
Creatinine, Ser: 0.58 mg/dL (ref 0.44–1.00)
GFR, Estimated: >60 mL/min (ref >60)
Glucose, Bld: 116 mg/dL — ABNORMAL HIGH (ref 70–99)
Potassium: 2.9 mmol/L — ABNORMAL LOW (ref 3.5–5.1)
Sodium: 134 mmol/L — ABNORMAL LOW (ref 135–145)

## 2023-01-11 LAB — GASTROINTESTINAL PANEL BY PCR, STOOL (REPLACES STOOL CULTURE)
Cyclospora cayetanensis: NOT DETECTED
Enteropathogenic E coli (EPEC): NOT DETECTED
Rotavirus A: NOT DETECTED

## 2023-01-11 LAB — PHOSPHORUS: Phosphorus: 2.5 mg/dL (ref 2.5–4.6)

## 2023-01-11 LAB — MAGNESIUM: Magnesium: 1.7 mg/dL (ref 1.7–2.4)

## 2023-01-11 MED ORDER — IOHEXOL 350 MG/ML SOLN
100.0000 mL | Freq: Once | INTRAVENOUS | Status: AC | PRN
  Administered 2023-01-11: 100 mL via INTRAVENOUS

## 2023-01-11 MED ORDER — MAGIC MOUTHWASH W/LIDOCAINE
15.0000 mL | Freq: Four times a day (QID) | ORAL | Status: DC
  Administered 2023-01-11: 15 mL via ORAL
  Filled 2023-01-11 (×8): qty 15

## 2023-01-11 MED ORDER — POTASSIUM CHLORIDE 20 MEQ PO PACK
40.0000 meq | PACK | Freq: Two times a day (BID) | ORAL | Status: AC
  Filled 2023-01-11 (×2): qty 2

## 2023-01-11 MED ORDER — POTASSIUM CHLORIDE CRYS ER 20 MEQ PO TBCR: 40.0000 meq | EXTENDED_RELEASE_TABLET | Freq: Two times a day (BID) | ORAL | Status: DC

## 2023-01-11 MED ORDER — PANTOPRAZOLE SODIUM 40 MG PO TBEC
40.0000 mg | DELAYED_RELEASE_TABLET | Freq: Every day | ORAL | Status: DC
  Filled 2023-01-11 (×3): qty 1

## 2023-01-11 MED ORDER — POTASSIUM CHLORIDE 10 MEQ/100ML IV SOLN
10.0000 meq | INTRAVENOUS | Status: AC
  Administered 2023-01-11 (×4): 10 meq via INTRAVENOUS
  Filled 2023-01-11 (×4): qty 100

## 2023-01-11 MED ORDER — FLUCONAZOLE 100 MG PO TABS
100.0000 mg | ORAL_TABLET | Freq: Every day | ORAL | Status: DC
  Administered 2023-01-11: 100 mg via ORAL
  Filled 2023-01-11 (×2): qty 1

## 2023-01-11 MED ORDER — SODIUM CHLORIDE (PF) 0.9 % IJ SOLN
INTRAMUSCULAR | Status: AC
  Filled 2023-01-11: qty 50

## 2023-01-11 NOTE — Progress Notes (Signed)
[facility]                                                                               Amber Bryant, is a 89 y.o. female, DOB - [birth year], ZOX:[id number] Admit date - 01/09/2023    Outpatient Primary MD for the patient is Mahlon Gammon, MD  LOS - 2  days    Brief summary   89 y.o. female with medical history significant of hypothyroidism, hypertension, hyperlipidemia , dementia, who presented to the emergency department with hematochezia and abdominal pain.  Patient was seen several weeks ago and diagnosed with colitis and discharged on outpatient regimen.  Patient states that since discharge she has had persistent blood in her stool and intermittent abdominal pain.  CT angio of the abdomen and pelvis show  No significant stenosis, dissection or occlusion of the SMA or IMA to provide a source for acute or chronic mesenteric ischemia. 2. There is relatively mild stenosis of the SMA due to fibrofatty atherosclerotic plaque but this does not appear to be hemodynamically significant. 3. Stable appearance of subocclusive thrombus within the superior mesenteric vein. 4. Slight interval progression of nearly occlusive thrombus within the inferior mesenteric vein.   Assessment & Plan    Assessment and Plan:   Hematochezia  with pancolitis  and SMA/IMA thrombus on CT abd and pelvis Differential include ischemic and infectious colitis. Completed outpatient treatment with antibiotics.  Hemoglobin dropped from 12.9 to 10.4.  Started her on IV heparin and empirically  on IV antibiotics. Started on clears , advance as tolerated.  Vascular surgery consulted by EDP, recommended anticoagulation.  GI and hematology on board.  Hypercoagulable work up ordered.  C diff and GI panel are negative.  CT angio of the abdomen and pelvis reviewed.      Leukocytosis:  Reactive vs infection.  Empirically on IV antibiotics.   Hypokalemia;  Replaced, check magnesium in am.     Acute anemia of blood loss  Drop in hemoglobin from 12.9 to 10.4 . Monitor, transfuse to keep hemoglobin greater than 7.   Hypertension:  Well controlled.   Hypothyroidism:  Resume synthroid 25 mcg daily.     Estimated body mass index is 24.86 kg/m as calculated from the following:   Height as of this encounter: 5\' 7"  (1.702 m).   Weight as of this encounter: 72 kg.  Code Status: full code.  DVT Prophylaxis:  SCDs Start: 01/09/23 2254   Level of Care: Level of care: Med-Surg Family Communication: none at bedside.   Disposition Plan:     Remains inpatient appropriate:  pending further evaluation.   Procedures:  CT abd and pelvis 1. 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 2. Pancolitis, presumably infectious/inflammatory and progressed from 12/25/2022.    Consultants:   Gastroenterology  Curb side with vascular  Hematology.   Antimicrobials:   Anti-infectives (From admission, onward)    Start     Dose/Rate Route Frequency Ordered Stop   01/10/23 1500  ciprofloxacin (CIPRO) IVPB 400 mg        400 mg 200 mL/hr over 60 Minutes Intravenous Every 12 hours 01/10/23 1213  01/10/23 1500  metroNIDAZOLE (FLAGYL) IVPB 500 mg        500 mg 100 mL/hr over 60 Minutes Intravenous Every 12 hours 01/10/23 1213     01/10/23 1300  metroNIDAZOLE (FLAGYL) IVPB 500 mg  Status:  Discontinued        500 mg 100 mL/hr over 60 Minutes Intravenous Every 12 hours 01/10/23 1136 01/10/23 1213   01/10/23 1230  ciprofloxacin (CIPRO) IVPB 400 mg  Status:  Discontinued        400 mg 200 mL/hr over 60 Minutes Intravenous Every 12 hours 01/10/23 1135 01/10/23 1213   01/09/23 2100  cefTRIAXone (ROCEPHIN) 2 g in sodium chloride 0.9 % 100 mL IVPB       Placed in "And" Linked Group   2 g 200 mL/hr over 30 Minutes Intravenous  Once 01/09/23 2053 01/09/23 2208   01/09/23 2100  metroNIDAZOLE (FLAGYL) IVPB 500 mg       Placed in "And" Linked Group   500 mg 100 mL/hr over 60  Minutes Intravenous  Once 01/09/23 2053 01/09/23 2324        Medications  Scheduled Meds:  levothyroxine  25 mcg Oral Q0600   Continuous Infusions:  ciprofloxacin 400 mg (01/11/23 0237)   heparin 1,300 Units/hr (01/10/23 2226)   lactated ringers 100 mL/hr at 01/10/23 2138   metronidazole 500 mg (01/11/23 0352)   PRN Meds:.acetaminophen **OR** acetaminophen, oxyCODONE    Subjective:   Amber Bryant was seen and examined today.  Persistent abdominal pain.   Objective:   Vitals:   01/10/23 1448 01/10/23 2113 01/11/23 0657 01/11/23 1237  BP: 127/64 126/62 (!) 106/54 (!) 110/59  Pulse: 84 83 82 77  Resp: 15 15 16 18   Temp: 98.6 F (37 C) 99.1 F (37.3 C) 97.8 F (36.6 C) 98.2 F (36.8 C)  TempSrc: Oral Oral    SpO2: 99% 95% 97% 97%  Weight:      Height:        Intake/Output Summary (Last 24 hours) at 01/11/2023 1324 Last data filed at 01/10/2023 1900 Gross per 24 hour  Intake 941.69 ml  Output --  Net 941.69 ml   Filed Weights   01/09/23 1802 01/10/23 0007  Weight: 68 kg 72 kg     Exam General exam: Ill appearing lady , not in distress.  Respiratory system: Diminished at bases. On RA.  Cardiovascular system: S1 & S2 heard, RRR. No JVD,  Gastrointestinal system: Abdomen is soft, distended, generalized tenderness.  Central nervous system: Alert and oriented to person and place. Extremities: no pedal edema. Skin: No rashes,  Psychiatry: Mood is appropriate.      Data Reviewed:  I have personally reviewed following labs and imaging studies   CBC Lab Results  Component Value Date   WBC 12.6 (H) 01/11/2023   RBC 3.75 (L) 01/11/2023   HGB 10.4 (L) 01/11/2023   HCT 32.5 (L) 01/11/2023   MCV 95.7 01/11/2023   MCH 30.4 01/11/2023   PLT 128 (L) 01/11/2023   MCHC 31.8 01/11/2023   RDW 13.9 01/11/2023   LYMPHSABS 1.0 01/09/2023   MONOABS 0.9 01/09/2023   EOSABS 0.0 01/09/2023   BASOSABS 0.0 01/09/2023     Last metabolic panel Lab Results   Component Value Date   NA 134 (L) 01/11/2023   K 2.9 (L) 01/11/2023   CL 100 01/11/2023   CO2 26 01/11/2023   BUN 11 01/11/2023   CREATININE 0.58 01/11/2023   GLUCOSE 116 (H) 01/11/2023   GFRNONAA >  60 01/11/2023   GFRAA >60 08/14/2016   CALCIUM 7.9 (L) 01/11/2023   PROT 6.8 01/09/2023   ALBUMIN 2.8 (L) 01/09/2023   BILITOT 0.8 01/09/2023   ALKPHOS 104 01/09/2023   AST 20 01/09/2023   ALT 12 01/09/2023   ANIONGAP 8 01/11/2023    CBG (last 3)  No results for input(s): "GLUCAP" in the last 72 hours.    Coagulation Profile: No results for input(s): "INR", "PROTIME" in the last 168 hours.   Radiology Studies: CT ANGIO ABDOMEN PELVIS  W & WO CONTRAST  Result Date: 01/11/2023 CLINICAL DATA:  Suspected ischemic colitis-known SMV and IMV thrombus. Assess arterial inflow. EXAM: CTA ABDOMEN AND PELVIS WITHOUT AND WITH CONTRAST TECHNIQUE: Multidetector CT imaging of the abdomen and pelvis was performed using the standard protocol during bolus administration of intravenous contrast. Multiplanar reconstructed images and MIPs were obtained and reviewed to evaluate the vascular anatomy. RADIATION DOSE REDUCTION: This exam was performed according to the departmental dose-optimization program which includes automated exposure control, adjustment of the mA and/or kV according to patient size and/or use of iterative reconstruction technique. CONTRAST:  OMNIPAQUE IOHEXOL 350 MG/ML SOLN COMPARISON:  CT abdomen/pelvis 01/09/2023 FINDINGS: VASCULAR Aorta: Normal in caliber. No evidence of aneurysm or dissection. Multifocal atherosclerotic plaque. Celiac: Patent without evidence of aneurysm, dissection, vasculitis or significant stenosis. SMA: Fibrofatty atherosclerotic plaque results in mild stenosis of the proximal IMA. The degree of stenosis is unlikely to be hemodynamically significant. The vessel remains patent. Renals: Multiple renal arteries bilaterally. There are 2 right-sided renal arteries  which appear code dominant. On the left, there is a dominant renal artery and a small accessory artery to the lower pole. No significant stenosis, dissection, aneurysm or changes of fibromuscular dysplasia. IMA: Patent without evidence of aneurysm, dissection, vasculitis or significant stenosis. Inflow: Patent without evidence of aneurysm, dissection, vasculitis or significant stenosis. Proximal Outflow: Bilateral common femoral and visualized portions of the superficial and profunda femoral arteries are patent without evidence of aneurysm, dissection, vasculitis or significant stenosis. Veins: Similar appearance of subocclusive thrombus within the superior mesenteric vein. Slight interval progression of segmental nearly occlusive thrombus within the inferior mesenteric vein. No extension into the splenic or portal vein. Hepatic veins are patent. Renal veins are patent. IVC and iliac veins are patent. Review of the MIP images confirms the above findings. NON-VASCULAR Lower chest: See concurrently obtained but separately dictated CT scan of the chest. Hepatobiliary: Normal hepatic contour and morphology. No discrete hepatic lesion. Small stones layer within the gallbladder lumen. Pancreas: Unremarkable. No pancreatic ductal dilatation or surrounding inflammatory changes. Spleen: Normal in size without focal abnormality. Adrenals/Urinary Tract: Normal adrenal glands. No hydronephrosis, nephrolithiasis or enhancing renal mass. Multifocal simple renal cysts including a large 8.2 cm simple cyst exophytic from the lower pole of the right kidney. Additionally, there are multiple simple parapelvic renal sinus cysts bilaterally. No imaging follow-up is recommended for any of these simple cystic lesions. Unremarkable ureters and bladder. Stomach/Bowel: Persistent diffuse submucosal edema affecting the entire colon but with evidence of hypervascularity and extensive pericolonic stranding. No involvement of the stomach or small  bowel. No evidence of obstruction. Lymphatic: No suspicious lymphadenopathy. Likely reactive lymph nodes present within the ileocolic mesentery. Reproductive: Status post hysterectomy. No adnexal masses. Other: No abdominal wall hernia or abnormality. No abdominopelvic ascites. Musculoskeletal: No acute fracture or aggressive appearing lytic or blastic osseous lesion. Mild multilevel degenerative disc disease. IMPRESSION: VASCULAR 1. No significant stenosis, dissection or occlusion of the SMA or IMA  to provide a source for acute or chronic mesenteric ischemia. 2. There is relatively mild stenosis of the SMA due to fibrofatty atherosclerotic plaque but this does not appear to be hemodynamically significant. 3. Stable appearance of subocclusive thrombus within the superior mesenteric vein. 4. Slight interval progression of nearly occlusive thrombus within the inferior mesenteric vein. 5. No evidence of propagation into the splenic or portal veins. 6. Extensive calcified atherosclerotic plaque including throughout the aorta. Aortic Atherosclerosis (ICD10-I70.0). NON-VASCULAR 1. Similar appearance of hand colitis. Differential considerations include both infectious and inflammatory etiologies. C difficile and ulcerative colitis are both within the differential. 2. Additional ancillary findings as above without interval change. Electronically Signed   By: Malachy Moan M.D.   On: 01/11/2023 12:05   CT ABDOMEN PELVIS W CONTRAST  Result Date: 01/09/2023 CLINICAL DATA:  Weakness, abdominal pain, loose/bloody stools EXAM: CT ABDOMEN AND PELVIS WITH CONTRAST TECHNIQUE: Multidetector CT imaging of the abdomen and pelvis was performed using the standard protocol following bolus administration of intravenous contrast. RADIATION DOSE REDUCTION: This exam was performed according to the departmental dose-optimization program which includes automated exposure control, adjustment of the mA and/or kV according to patient size  and/or use of iterative reconstruction technique. CONTRAST:  OMNIPAQUE IOHEXOL 300 MG/ML  SOLN COMPARISON:  CT 12/25/2022 FINDINGS: Lower chest: No acute abnormality. Hepatobiliary: Cholelithiasis. No focal hepatic lesion. No evidence of cholecystitis. No biliary dilation. Pancreas: Unremarkable. Spleen: Unremarkable. Adrenals/Urinary Tract: Stable adrenal glands. Low-attenuation lesions in the kidneys are statistically likely to represent cysts. No follow-up is required. No urinary calculi or hydronephrosis. Unremarkable bladder. Stomach/Bowel: Diffuse colonic wall thickening and mucosal hyperenhancement with adjacent pericolonic inflammatory stranding. This has progressed from 12/25/2022. No evidence of obstruction. Stomach is within normal limits. Vascular/Lymphatic: New thrombus within the superior mesenteric vein causes severe narrowing and near occlusion (series 2/image 29). There is additional thrombus within the inferior mesenteric vein causing severe narrowing (series 2/image 26. Splenic and portal veins are patent. No lymphadenopathy by size. Reproductive: No acute abnormality. Other: Trace free fluid in the pelvis and right paracolic gutter. No free intraperitoneal air. Musculoskeletal: No acute fracture.  Left THA. IMPRESSION: 1. 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 2. Pancolitis, presumably infectious/inflammatory and progressed from 12/25/2022. These results were called by telephone at the time of interpretation on 01/09/2023 at 8:47 pm to provider [facility] , who verbally acknowledged these results. Electronically Signed   By: Minerva Fester M.D.   On: 01/09/2023 20:48       Kathlen Mody M.D. [facility] 01/11/2023, 1:24 PM  Available via Epic secure chat 7am-7pm After 7 pm, please refer to night coverage provider listed on amion.

## 2023-01-11 NOTE — Progress Notes (Addendum)
Daily Progress Note  DOA: 01/09/2023 Hospital Day: 3 Chief Complaint: possible ischemic colitis.   ASSESSMENT & PLAN   Brief Narrative:  Amber Bryant is a 89 y.o. year old female with a history of ischemic colitis, dementia, OA, HTN, hypothyroidism, cholelithiasis. Admitted with diffuse colitis concerning for ischemic colitis with new thrombus in SMV and IMV causing near occlusion    Loose stool / hematochezia with pan colitis on CT scan (progressed from only left sided colitis on CT scan on 12/25/22). Also new thrombus in SMV and IMV causing near occlusion. Most likely ischemic colitis,  infectious workup in progress.  -C. diff negative. GI path panel in progress -Appreciate Hematology's evaluation. Recommendation is to continue anticoagulation, convert to DOAC at some point. Presently, there is no obvious malignancy as cause for thrombosis -Hypercoagulation workup in progress -Patient just back from CT angiogram, results pending -Hgb stable at 11.4.  -Continue Cipro / Flagyl in setting of diffuse ischemic colitis. -Still having loose stool with blood (on Heparin) but otherwise feels okay. Would like to eat  Pancreatic tail cyst measuring 7 mm on CT scan.  -Dr. Truett Perna reviewed CT scan with Radiology and lesion doesn't appear to be concerning for malignancy   Attending Physician Note   I have taken an interval history, reviewed the chart and examined the patient. I performed more than 50% of this encounter in conjunction with the APP. I agree with the APP's note, impression and recommendations with my edits. My additional impressions and recommendations are as follows.   Suspected ischemic colitis - hematochezia and diarrhea are resolving. GI pathogen panel negative and C diff negative.  Continue Cipro / Flagyl in setting of diffuse ischemic colitis Advance to soft diet today Conservative mgmt - no plans for colonoscopy or flex sigmoidoscopy  New SMV & IMV thrombosis on IV  heparin. Hematology following.  Pancreatic tail cyst, 7 mm, felt to be benign. No further evaluation at this time.   Claudette Head, MD [facility] See AMION, [facility], for our on call provider     Subjective  Still having loose stool with blood ( on Heparin) but otherwise feels okay. Would like to eat  Objective:   Recent Labs    01/09/23 1900 01/10/23 0329 01/10/23 1703 01/11/23 0138  WBC 11.9* 12.8*  --  12.6*  HGB 12.9 11.6* 11.2* 11.4*  HCT 40.3 36.4 35.1* 35.9*  PLT 196 142*  --  128*   BMET Recent Labs    01/09/23 1900 01/10/23 0329  NA 134* 134*  K 3.1* 3.1*  CL 96* 98  CO2 25 25  GLUCOSE 125* 135*  BUN 13 10  CREATININE 0.66 0.75  CALCIUM 8.6* 8.2*   LFT Recent Labs    01/09/23 1900  PROT 6.8  ALBUMIN 2.8*  AST 20  ALT 12  ALKPHOS 104  BILITOT 0.8   PT/INR No results for input(s): "LABPROT", "INR" in the last 72 hours.   Imaging:  CT ABDOMEN PELVIS W CONTRAST CLINICAL DATA:  Weakness, abdominal pain, loose/bloody stools  EXAM: CT ABDOMEN AND PELVIS WITH CONTRAST  TECHNIQUE: Multidetector CT imaging of the abdomen and pelvis was performed using the standard protocol following bolus administration of intravenous contrast.  RADIATION DOSE REDUCTION: This exam was performed according to the departmental dose-optimization program which includes automated exposure control, adjustment of the mA and/or kV according to patient size and/or use of iterative reconstruction technique.  CONTRAST:  OMNIPAQUE IOHEXOL 300 MG/ML  SOLN  COMPARISON:  CT 12/25/2022  FINDINGS: Lower chest: No acute abnormality.  Hepatobiliary: Cholelithiasis. No focal hepatic lesion. No evidence of cholecystitis. No biliary dilation.  Pancreas: Unremarkable.  Spleen: Unremarkable.  Adrenals/Urinary Tract: Stable adrenal glands. Low-attenuation lesions in the kidneys are statistically likely to represent cysts. No follow-up is required. No urinary calculi or  hydronephrosis. Unremarkable bladder.  Stomach/Bowel: Diffuse colonic wall thickening and mucosal hyperenhancement with adjacent pericolonic inflammatory stranding. This has progressed from 12/25/2022. No evidence of obstruction. Stomach is within normal limits.  Vascular/Lymphatic: New thrombus within the superior mesenteric vein causes severe narrowing and near occlusion (series 2/image 29). There is additional thrombus within the inferior mesenteric vein causing severe narrowing (series 2/image 26. Splenic and portal veins are patent.  No lymphadenopathy by size.  Reproductive: No acute abnormality.  Other: Trace free fluid in the pelvis and right paracolic gutter. No free intraperitoneal air.  Musculoskeletal: No acute fracture.  Left THA.  IMPRESSION: 1. 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 2. Pancolitis, presumably infectious/inflammatory and progressed from 12/25/2022.  These results were called by telephone at the time of interpretation on 01/09/2023 at 8:47 pm to provider [facility] , who verbally acknowledged these results.  Electronically Signed   By: Minerva Fester M.D.   On: 01/09/2023 20:48     Scheduled inpatient medications:   levothyroxine  25 mcg Oral Q0600   Continuous inpatient infusions:   ciprofloxacin 400 mg (01/11/23 0237)   heparin 1,300 Units/hr (01/10/23 2226)   lactated ringers 100 mL/hr at 01/10/23 2138   metronidazole 500 mg (01/11/23 0352)   PRN inpatient medications: acetaminophen **OR** acetaminophen, oxyCODONE  Vital signs in last 24 hours: Temp:  [97.8 F (36.6 C)-99.1 F (37.3 C)] 97.8 F (36.6 C) (07/26 0657) Pulse Rate:  [72-84] 82 (07/26 0657) Resp:  [15-16] 16 (07/26 0657) BP: (106-128)/(54-64) 106/54 (07/26 0657) SpO2:  [95 %-99 %] 97 % (07/26 0657) Last BM Date : 01/10/23  Intake/Output Summary (Last 24 hours) at 01/11/2023 0919 Last data filed at 01/10/2023 1900 Gross per 24 hour   Intake 941.69 ml  Output --  Net 941.69 ml    Intake/Output from previous day: 07/25 0701 - 07/26 0700 In: 941.7 [I.V.:640.9; IV Piggyback:300.8] Out: -  Intake/Output this shift: No intake/output data recorded.   Physical Exam:  General: Alert female in NAD Heart:  Regular rate and rhythm.  Pulmonary: Normal respiratory effort Abdomen: Soft, nondistended, mild-moderate generalized lower abdominal tenderness.  Normal bowel sounds. Extremities: No lower extremity edema  Neurologic: Alert and oriented Psych: Pleasant. Cooperative. Insight appears normal.    Principal Problem:   Acute blood loss anemia     LOS: 2 days   Willette Cluster ,NP 01/11/2023, 9:19 AM

## 2023-01-11 NOTE — Evaluation (Signed)
SLP Cancellation Note  Patient Details Name: Amber Bryant MRN: [id number] DOB: 1929/03/27   Cancelled treatment:       Reason Eval/Treat Not Completed: Other (comment);Patient declined, no reason specified (SLP introduced self to pt and reasoning for visit; advised that MD ordered a swallow evaluation and clinical reasoning; She stated "Ok, I'm going to swallow, watch".  And when advised that SLP needs to complete an eval oral mechanism exam, CN testing and po trials, she stated "Oh, I'm not doing all of that".  She did not allow SLP to examine oral cavity stating "It's better" when SLP asked about her taking nystatin.)  RN reports pt declining labs, PT, etc - Will continue efforts.    Rolena Infante, MS [facility] SLP Acute Rehab Services Office [phone]    Chales Abrahams 01/11/2023, 7:11 PM

## 2023-01-11 NOTE — Progress Notes (Addendum)
ANTICOAGULATION CONSULT NOTE - follow up  Pharmacy Consult for heparin Indication: VTE treatment  Allergies  Allergen Reactions   Onion Other (See Comments)    Excessive sneezing   Shellfish Allergy Shortness Of Breath   Ambien [Zolpidem] Other (See Comments)    "Allergic," per facility   Amoxicillin Other (See Comments)    "Allergic," per facility    Patient Measurements: Height: 5\' 7"  (170.2 cm) Weight: 72 kg (158 lb 11.7 oz) IBW/kg (Calculated) : 61.6 Heparin Dosing Weight: 68kg  Vital Signs: Temp: 97.8 F (36.6 C) (07/26 0657) Temp Source: Oral (07/25 2113) BP: 106/54 (07/26 0657) Pulse Rate: 82 (07/26 0657)  Labs: Recent Labs    01/09/23 1900 01/10/23 0329 01/10/23 1116 01/10/23 1703 01/10/23 2107 01/11/23 0138 01/11/23 0711  HGB 12.9 11.6*  --  11.2*  --  11.4*  --   HCT 40.3 36.4  --  35.1*  --  35.9*  --   PLT 196 142*  --   --   --  128*  --   HEPARINUNFRC  --   --  <0.10*  --  0.30  --  0.36  CREATININE 0.66 0.75  --   --   --   --   --     Estimated Creatinine Clearance: 43.6 mL/min (by C-G formula based on SCr of 0.75 mg/dL).   Medical History: Past Medical History:  Diagnosis Date   Arthritis    Closed left femoral fracture (HCC) 2012   s/p ORIF, Dr. Ave Filter   Closed left radial fracture 2012   s/p reduction   Complication of anesthesia 08/14/2016   not able to think clear for a long time afterwards   Essential hypertension    Hyperlipidemia    Hypothyroidism      Assessment: 89 y.o. female with medical history significant of hypothyroidism, hypertension, hyperlipidemia who presented to the emergency department with hematochezia and abdominal pain. Hematochezia most likely secondary to ischemic colitis, IMV clots noted on CT, pharmacy to dose heparin, no prior AC noted.  Heparin level today is therapeutic at 0.36, on 1300 units/hr. Hgb 11.4--stable, plt 128--low, stable and pharmacy will continue to monitor. Patient continued to have  bloody BMs yesterday but none this morning, per nursing. Although heparin level is at lower end of therapeutic range, will continue with same heparin rate given decreasing platelets and continued bloody BMs.  Goal of Therapy:  Heparin level 0.3-0.7 units/ml Monitor platelets by anticoagulation protocol: Yes   Plan: Continue heparin drip at 1300 units/hr Check confirmatory heparin level in 8 hours Daily CBC  Addendum: Per nursing, patient refused her 1400 confirmatory heparin level. Given her recent heparin levels of 0.3 on 1200 units/hr and 0.36 on 1300 units/hr, she will likely remain stable on 1300 units/hr. Will continue current rate and transition to daily heparin levels beginning 7/27 AM.    Cherylin Mylar, PharmD Clinical Pharmacist  7/26/20248:10 AM

## 2023-01-11 NOTE — Progress Notes (Signed)
PT Cancellation Note  Patient Details Name: Amber Bryant MRN: [id number] DOB: 1928-08-12   Cancelled Treatment:    Reason Eval/Treat Not Completed: Fatigue/lethargy limiting ability to participate (pt stated she needs to take a nap right now and politely requested PT come back at a later time. Will follow.)  Tamala Ser PT 01/11/2023  Acute Rehabilitation Services  Office [phone]

## 2023-01-11 NOTE — Plan of Care (Signed)
  Problem: Coping: Goal: Level of anxiety will decrease Outcome: Progressing   Problem: Pain Managment: Goal: General experience of comfort will improve Outcome: Progressing   

## 2023-01-11 NOTE — Plan of Care (Signed)
  Problem: Activity: Goal: Risk for activity intolerance will decrease Outcome: Progressing   Problem: Elimination: Goal: Will not experience complications related to bowel motility Outcome: Progressing   Problem: Safety: Goal: Ability to remain free from injury will improve Outcome: Progressing   

## 2023-01-11 NOTE — Progress Notes (Addendum)
IP PROGRESS NOTE  Subjective:   Amber Bryant continues to have bloody diarrhea.  She has anorexia.  She reports soreness in the mouth/throat with difficulty swallowing.  Objective: Vital signs in last 24 hours: Blood pressure (!) 106/54, pulse 82, temperature 97.8 F (36.6 C), resp. rate 16, height 5\' 7"  (1.702 m), weight 158 lb 11.7 oz (72 kg), SpO2 97%.  Intake/Output from previous day: 07/25 0701 - 07/26 0700 In: 941.7 [I.V.:640.9; IV Piggyback:300.8] Out: -   Physical Exam:  HEENT: Thrush at the buccal mucosa, tongue, and palate, difficult to visualize the pharynx Abdomen: Soft, mild diffuse tenderness, no mass, no hepatosplenomegaly   Lab Results: Recent Labs    01/10/23 0329 01/10/23 1703 01/11/23 0138  WBC 12.8*  --  12.6*  HGB 11.6* 11.2* 11.4*  HCT 36.4 35.1* 35.9*  PLT 142*  --  128*    BMET Recent Labs    01/09/23 1900 01/10/23 0329  NA 134* 134*  K 3.1* 3.1*  CL 96* 98  CO2 25 25  GLUCOSE 125* 135*  BUN 13 10  CREATININE 0.66 0.75  CALCIUM 8.6* 8.2*    No results found for: "CEA1", "CEA", "UEA540", "CA125"  Studies/Results: CT ABDOMEN PELVIS W CONTRAST  Result Date: 01/09/2023 CLINICAL DATA:  Weakness, abdominal pain, loose/bloody stools EXAM: CT ABDOMEN AND PELVIS WITH CONTRAST TECHNIQUE: Multidetector CT imaging of the abdomen and pelvis was performed using the standard protocol following bolus administration of intravenous contrast. RADIATION DOSE REDUCTION: This exam was performed according to the departmental dose-optimization program which includes automated exposure control, adjustment of the mA and/or kV according to patient size and/or use of iterative reconstruction technique. CONTRAST:  OMNIPAQUE IOHEXOL 300 MG/ML  SOLN COMPARISON:  CT 12/25/2022 FINDINGS: Lower chest: No acute abnormality. Hepatobiliary: Cholelithiasis. No focal hepatic lesion. No evidence of cholecystitis. No biliary dilation. Pancreas: Unremarkable. Spleen:  Unremarkable. Adrenals/Urinary Tract: Stable adrenal glands. Low-attenuation lesions in the kidneys are statistically likely to represent cysts. No follow-up is required. No urinary calculi or hydronephrosis. Unremarkable bladder. Stomach/Bowel: Diffuse colonic wall thickening and mucosal hyperenhancement with adjacent pericolonic inflammatory stranding. This has progressed from 12/25/2022. No evidence of obstruction. Stomach is within normal limits. Vascular/Lymphatic: New thrombus within the superior mesenteric vein causes severe narrowing and near occlusion (series 2/image 29). There is additional thrombus within the inferior mesenteric vein causing severe narrowing (series 2/image 26. Splenic and portal veins are patent. No lymphadenopathy by size. Reproductive: No acute abnormality. Other: Trace free fluid in the pelvis and right paracolic gutter. No free intraperitoneal air. Musculoskeletal: No acute fracture.  Left THA. IMPRESSION: 1. 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 2. Pancolitis, presumably infectious/inflammatory and progressed from 12/25/2022. These results were called by telephone at the time of interpretation on 01/09/2023 at 8:47 pm to provider [facility] , who verbally acknowledged these results. Electronically Signed   By: Minerva Fester M.D.   On: 01/09/2023 20:48    Medications: I have reviewed the patient's current medications.  Assessment/Plan:  SMV thrombosis CT abdomen/pelvis 01/09/2023-new thrombus in the SMV and IMV with near occlusion, pancolitis progressive from 12/25/2022 CT angiogram abdomen/pelvis 12/12/2022-stable subocclusive SMV thrombus, slight progression of nearly occlusive IMV thrombus  2.  Colitis-pancolitis on CT 01/09/2023, progressive from CT 12/25/2022 CT angiogram abdomen/pelvis 01/11/2023-no significant stenosis or occlusion of the SMA or IMA, mild stenosis of the SMA 3.  Diverticulitis February 2022 4.  Anorexia/weight  loss 12/12/2022-CT angiogram abdomen/pelvis and CT chest-no evidence of malignancy other than  indeterminant groundglass nodular opacities in both lungs 5.  Mild anemia secondary to GI bleeding, anorexia? 6.  Leukocytosis 7.  Hypertension 8.  Hypercholesterolemia 9.  Oral candidiasis-Diflucan 01/11/2023   Amber Bryant appears unchanged.  She complains of a sore throat and difficulty swallowing.  She appears to have oral candidiasis.  I will place her on Diflucan.  She continues heparin anticoagulation for the SMV thrombus.  A hypercoagulation panel is pending.  CTs reveal no significant arterial stenosis and no clear evidence of a malignancy.  There are indeterminate subsolid pulmonary nodules.  Recommendations: Continue heparin anticoagulation for now, can convert to an oral anticoagulant when bleeding has resolved Follow-up chest CT 3 months to follow-up on the groundglass pulmonary nodules, can be ordered by primary provider Follow-up hypercoagulation panel Diflucan for oral candidiasis I will check on her 01/14/2023 she remains in the hospital, call oncology as needed She will need long-term anticoagulation and can follow-up with primary provider for management of anticoagulation therapy Consider swallow study, upper endoscopy for persistent report of dysphagia  LOS: 2 days   Thornton Papas, MD   01/11/2023, 7:23 AM

## 2023-01-11 NOTE — Plan of Care (Signed)
  Problem: Education: Goal: Knowledge of General Education information will improve Description: Including pain rating scale, medication(s)/side effects and non-pharmacologic comfort measures Outcome: Progressing   Problem: Health Behavior/Discharge Planning: Goal: Ability to manage health-related needs will improve Outcome: Progressing   Problem: Clinical Measurements: Goal: Ability to maintain clinical measurements within normal limits will improve Outcome: Progressing Goal: Will remain free from infection Outcome: Progressing Goal: Diagnostic test results will improve Outcome: Progressing Goal: Respiratory complications will improve Outcome: Progressing Goal: Cardiovascular complication will be avoided Outcome: Progressing   Problem: Activity: Goal: Risk for activity intolerance will decrease Outcome: Progressing   Problem: Nutrition: Goal: Adequate nutrition will be maintained Outcome: Progressing   Problem: Coping: Goal: Level of anxiety will decrease Outcome: Progressing   Problem: Elimination: Goal: Will not experience complications related to bowel motility Outcome: Progressing Goal: Will not experience complications related to urinary retention Outcome: Progressing   Problem: Pain Managment: Goal: General experience of comfort will improve Outcome: Progressing   Problem: Safety: Goal: Ability to remain free from injury will improve Outcome: Progressing   Problem: Skin Integrity: Goal: Risk for impaired skin integrity will decrease Outcome: Progressing  Pt A/Ox4 on RA. Up to bsc x1 assist multiple times throughout night with bloody diarrhea. IV abx administered. IV Fluids running. Hep gtt running at 82ml/hr. No c/o pain.

## 2023-01-12 DIAGNOSIS — K55069 Acute infarction of intestine, part and extent unspecified: Secondary | ICD-10-CM | POA: Diagnosis not present

## 2023-01-12 DIAGNOSIS — K529 Noninfective gastroenteritis and colitis, unspecified: Secondary | ICD-10-CM | POA: Diagnosis not present

## 2023-01-12 DIAGNOSIS — D62 Acute posthemorrhagic anemia: Secondary | ICD-10-CM | POA: Diagnosis not present

## 2023-01-12 DIAGNOSIS — E039 Hypothyroidism, unspecified: Secondary | ICD-10-CM | POA: Diagnosis not present

## 2023-01-12 LAB — BASIC METABOLIC PANEL
Anion gap: 8 (ref 5–15)
BUN: 10 mg/dL (ref 8–23)
CO2: 25 mmol/L (ref 22–32)
Calcium: 7.7 mg/dL — ABNORMAL LOW (ref 8.9–10.3)
Chloride: 100 mmol/L (ref 98–111)
Creatinine, Ser: 0.73 mg/dL (ref 0.44–1.00)
GFR, Estimated: >60 mL/min (ref >60)
Glucose, Bld: 116 mg/dL — ABNORMAL HIGH (ref 70–99)
Potassium: 3.4 mmol/L — ABNORMAL LOW (ref 3.5–5.1)
Sodium: 133 mmol/L — ABNORMAL LOW (ref 135–145)

## 2023-01-12 LAB — MAGNESIUM: Magnesium: 1.6 mg/dL — ABNORMAL LOW (ref 1.7–2.4)

## 2023-01-12 MED ORDER — SODIUM CHLORIDE 0.9 % IV SOLN
2.0000 g | INTRAVENOUS | Status: DC
  Administered 2023-01-12: 2 g via INTRAVENOUS
  Filled 2023-01-12: qty 20

## 2023-01-12 MED ORDER — APIXABAN 5 MG PO TABS: 10.0000 mg | ORAL_TABLET | Freq: Two times a day (BID) | ORAL | Status: DC

## 2023-01-12 MED ORDER — APIXABAN 5 MG PO TABS: 5.0000 mg | ORAL_TABLET | Freq: Two times a day (BID) | ORAL | Status: DC

## 2023-01-12 MED ORDER — MORPHINE SULFATE (PF) 2 MG/ML IV SOLN: 0.5000 mg | INTRAVENOUS | Status: DC | PRN

## 2023-01-12 NOTE — Progress Notes (Addendum)
ANTICOAGULATION CONSULT NOTE - follow up  Pharmacy Consult for heparin>> apixaban Indication: VTE treatment  Allergies  Allergen Reactions   Onion Other (See Comments)    Excessive sneezing   Shellfish Allergy Shortness Of Breath   Ambien [Zolpidem] Other (See Comments)    "Allergic," per facility   Amoxicillin Other (See Comments)    "Allergic," per facility    Patient Measurements: Height: 5\' 7"  (170.2 cm) Weight: 72 kg (158 lb 11.7 oz) IBW/kg (Calculated) : 61.6 Heparin Dosing Weight: 68kg  Vital Signs: Temp: 98 F (36.7 C) (07/27 0632) BP: 119/60 (07/27 0632) Pulse Rate: 75 (07/27 0632)  Labs: Recent Labs    01/09/23 1900 01/10/23 0329 01/10/23 1116 01/10/23 2107 01/11/23 0138 01/11/23 0711 01/11/23 1039 01/11/23 1824 01/12/23 0023 01/12/23 0359  HGB 12.9 11.6*   < >  --  11.4*  --  10.4* 10.6* 10.0* 9.9*  HCT 40.3 36.4   < >  --  35.9*  --  32.5* 33.6* 31.4* 30.5*  PLT 196 142*  --   --  128*  --   --   --   --  101*  HEPARINUNFRC  --   --    < > 0.30  --  0.36  --   --   --  0.31  CREATININE 0.66 0.75  --   --   --   --  0.58  --   --   --    < > = values in this interval not displayed.    Estimated Creatinine Clearance: 43.6 mL/min (by C-G formula based on SCr of 0.58 mg/dL).   Assessment: 89 y.o. female with medical history significant of hypothyroidism, hypertension, hyperlipidemia who presented to the emergency department with hematochezia and abdominal pain. Hematochezia most likely secondary to ischemic colitis, IMV clots noted on CT, pharmacy to dose heparin, no prior AC noted.   01/12/2023 Heparin level today is therapeutic at 0.31, on 1300 units/hr.  Hgb down 9.9, plt down 101  Patient continued to have bloody BMs yesterday but none this morning, per nursing. Although heparin level is at lower end of therapeutic range, will continue with same heparin rate given decreasing platelets and continued bloody BMs. To transition to apixaban today per  TR[facility] orders Pt refusing all meds  Goal of Therapy:  Heparin level 0.3-0.7 units/ml Monitor platelets by anticoagulation protocol: Yes   Plan: DC heparin drip Per discussion w/ [facility] will skip loading dose and start with apixaban 5 mg po bid Pt from SNF so will not need 30 day free card Will educate pt/family prior to discharge  Herby Abraham, Pharm.D Use secure chat for questions 01/12/2023 11:37 AM

## 2023-01-12 NOTE — Evaluation (Addendum)
Occupational Therapy Evaluation Patient Details Name: Amber Bryant MRN: [id number] DOB: [birth year] Today's Date: 01/12/2023   History of Present Illness 89 y.o. female with medical history significant of hypothyroidism, hypertension, hyperlipidemia , dementia, who presented to the emergency department with hematochezia and abdominal pain.  Patient was seen several weeks ago and diagnosed with colitis and discharged on outpatient regimen.  Patient states that since discharge she has had persistent blood in her stool and intermittent abdominal pain.   Clinical Impression   Pt is a resident of [facility], walks with a RW and is assisted for some aspects of bathing and dressing and all IADLs. Pt presents with generalized weakness, decreased activity tolerance and impaired standing balance. She was too fatigued after using BSC to ambulate. Pt needs min to mod assist for bed mobility, min assist +2 safety to stand and step-pivot. She requires up to total assist for ADLs. Patient will benefit from continued inpatient follow up therapy, <3 hours/day.      Recommendations for follow up therapy are one component of a multi-disciplinary discharge planning process, led by the attending physician.  Recommendations may be updated based on patient status, additional functional criteria and insurance authorization.   Assistance Recommended at Discharge Frequent or constant Supervision/Assistance  Patient can return home with the following Two people to help with walking and/or transfers;A lot of help with bathing/dressing/bathroom;Assistance with cooking/housework;Assist for transportation;Help with stairs or ramp for entrance    Functional Status Assessment  Patient has had a recent decline in their functional status and demonstrates the ability to make significant improvements in function in a reasonable and predictable amount of time.  Equipment Recommendations  None recommended by OT     Recommendations for Other Services       Precautions / Restrictions Precautions Precautions: Fall      Mobility Bed Mobility Overal bed mobility: Needs Assistance Bed Mobility: Supine to Sit, Sit to Supine     Supine to sit: Min assist Sit to supine: Mod assist   General bed mobility comments: + use of rail, assist to raise trunk and for LEs back into bed    Transfers Overall transfer level: Needs assistance Equipment used: Rolling walker (2 wheels) Transfers: Sit to/from Stand, Bed to chair/wheelchair/BSC Sit to Stand: +2 physical assistance, Min assist     Step pivot transfers: Min assist, +2 safety/equipment     General transfer comment: assist to rise and steady      Balance Overall balance assessment: Needs assistance   Sitting balance-Leahy Scale: Good       Standing balance-Leahy Scale: Poor                             ADL either performed or assessed with clinical judgement   ADL Overall ADL's : Needs assistance/impaired Eating/Feeding: Independent;Bed level   Grooming: Supervision/safety;Sitting   Upper Body Bathing: Minimal assistance;Sitting   Lower Body Bathing: Maximal assistance;Sit to/from stand   Upper Body Dressing : Minimal assistance;Sitting   Lower Body Dressing: Total assistance;Sit to/from stand   Toilet Transfer: Minimal assistance;+2 for safety/equipment;Stand-pivot;BSC/3in1;Rolling walker (2 wheels)   Toileting- Clothing Manipulation and Hygiene: Total assistance;Sit to/from stand               Vision Ability to See in Adequate Light: 0 Adequate Patient Visual Report: No change from baseline       Perception     Praxis      Pertinent Vitals/Pain Pain  Assessment Pain Assessment: Faces Faces Pain Scale: Hurts little more Pain Location: buttocks Pain Descriptors / Indicators: Guarding, Sore Pain Intervention(s): Other (comment) (barrier cream applied)     Hand Dominance Right   Extremity/Trunk  Assessment Upper Extremity Assessment Upper Extremity Assessment: Generalized weakness;Overall [facility] for tasks assessed   Lower Extremity Assessment Lower Extremity Assessment: Defer to PT evaluation   Cervical / Trunk Assessment Cervical / Trunk Assessment: Other exceptions (increased habitus)   Communication Communication Communication: No difficulties   Cognition Arousal/Alertness: Awake/alert Behavior During Therapy: WFL Overall Cognitive Status: baseline dementia                                 General Comments: fastidious     General Comments       Exercises     Shoulder Instructions      Home Living Family/patient expects to be discharged to:: Assisted living                             Home Equipment: Rolling Walker (2 wheels)          Prior Functioning/Environment Prior Level of Function : Needs assist             Mobility Comments: walks with RW ADLs Comments: assist for bathing, dressing, IADLs        OT Problem List: Decreased strength;Decreased activity tolerance;Impaired balance (sitting and/or standing);Obesity      OT Treatment/Interventions: DME and/or AE instruction;Self-care/ADL training;Therapeutic activities;Patient/family education;Balance training    OT Goals(Current goals can be found in the care plan section) Acute Rehab OT Goals OT Goal Formulation: With patient Time For Goal Achievement: 01/26/23 Potential to Achieve Goals: Good ADL Goals Pt Will Perform Grooming: with min guard assist;standing Pt Will Perform Upper Body Bathing: with min assist;sitting Pt Will Perform Upper Body Dressing: with set-up;sitting Pt Will Transfer to Toilet: with min guard assist;ambulating;bedside commode Pt Will Perform Toileting - Clothing Manipulation and hygiene: with min assist;sit to/from stand Additional ADL Goal #1: Pt will perform supine to sit with rail and supervision in preparation for ADLs.  OT Frequency: Min  1X/week    Co-evaluation PT/OT/SLP Co-Evaluation/Treatment: Yes Reason for Co-Treatment: For patient/therapist safety   OT goals addressed during session: ADL's and self-care      AM-PAC OT "6 Clicks" Daily Activity     Outcome Measure Help from another person eating meals?: None Help from another person taking care of personal grooming?: A Little Help from another person toileting, which includes using toliet, bedpan, or urinal?: Total Help from another person bathing (including washing, rinsing, drying)?: A Lot Help from another person to put on and taking off regular upper body clothing?: A Little Help from another person to put on and taking off regular lower body clothing?: Total 6 Click Score: 14   End of Session Equipment Utilized During Treatment: Rolling walker (2 wheels)  Activity Tolerance: Patient limited by fatigue Patient left: in bed;with call bell/phone within reach;with bed alarm set  OT Visit Diagnosis: Unsteadiness on feet (R26.81);Muscle weakness (generalized) (M62.81)                Time: 1010-1035 OT Time Calculation (min): 25 min Charges:  OT General Charges $OT Visit: 1 Visit OT Evaluation $OT Eval Moderate Complexity: 1 Mod  Berna Spare, OTR/L Acute Rehabilitation Services Office: [phone]   Evern Bio 01/12/2023, 1:45 PM

## 2023-01-12 NOTE — Progress Notes (Addendum)
[facility]                                                                               Amber Bryant, is a 89 y.o. female, DOB - 06/04/1929, ZHY:[id number] Admit date - 01/09/2023    Outpatient Primary MD for the patient is Mahlon Gammon, MD  LOS - 3  days    Brief summary   89 y.o. female with medical history significant of hypothyroidism, hypertension, hyperlipidemia , dementia, who presented to the emergency department with hematochezia and abdominal pain.  Patient was seen several weeks ago and diagnosed with colitis and discharged on outpatient regimen.  Patient states that since discharge she has had persistent blood in her stool and intermittent abdominal pain.  CT angio of the abdomen and pelvis show  No significant stenosis, dissection or occlusion of the SMA or IMA to provide a source for acute or chronic mesenteric ischemia. 2. There is relatively mild stenosis of the SMA due to fibrofatty atherosclerotic plaque but this does not appear to be hemodynamically significant. 3. Stable appearance of subocclusive thrombus within the superior mesenteric vein. 4. Slight interval progression of nearly occlusive thrombus within the inferior mesenteric vein.  Patient seen and examined at bedside. She is alert and oriented , she is requesting to stop the blood draws, the medications,  be transitioned to hospice care. She also requested to speak to palliative care and  discharge to hospice facility.    Assessment & Plan    Assessment and Plan:   Hematochezia  with pancolitis  and  SM and IM venous thrombus on CT abd and pelvis/ CT angio of the abdomen and pelvis.  Differential include ischemic and infectious colitis. Completed outpatient treatment with antibiotics.  Hemoglobin dropped from 12.9 to 10.4 to 9.9.  She was initially started on IV heparin and transitioned to eliquis as she is refusing blood draws to check the heparin level.  She was empirically  started on IV antibiotics Vascular surgery consulted by EDP, recommended anticoagulation.  GI and hematology on board.  Hypercoagulable work up ordered and pending.  C diff and GI panel are negative.    Dysphagia and Odynophagia - she is refusing any more testing at this time.  - currently on fluconazole and magic mouth wash with lidocaine.    Leukocytosis:  Reactive vs infection. Resolved.  Empirically on IV antibiotics.   Hypokalemia;  Replaced, repeat labs ordered .    Acute anemia of blood loss  Drop in hemoglobin from 12.9 to 10.4 to 9.9 today.  She is refusing any more blood draws.   Hypertension:  Optimal BP parameters.   Hypothyroidism:  Resume synthroid 25 mcg daily.    In view of persistent hematochezia, poor oral intake, deconditioning, not responding to treatment, palliative care consulted for goals of care discussions.    Estimated body mass index is 24.86 kg/m as calculated from the following:   Height as of this encounter: 5\' 7"  (1.702 m).   Weight as of this encounter: 72 kg.  Code Status: full code.  DVT Prophylaxis:  SCDs Start: 01/09/23 2254 apixaban (ELIQUIS) tablet 5 mg   Level of Care: Level of  care: Med-Surg Family Communication: none at bedside.   Disposition Plan:     Remains inpatient appropriate:  pending palliative care.   Procedures:  CT abd and pelvis 1. 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 2. Pancolitis, presumably infectious/inflammatory and progressed from 12/25/2022.    Consultants:   Gastroenterology  Curb side with vascular  Hematology.   Antimicrobials:   Anti-infectives (From admission, onward)    Start     Dose/Rate Route Frequency Ordered Stop   01/12/23 1400  cefTRIAXone (ROCEPHIN) 2 g in sodium chloride 0.9 % 100 mL IVPB        2 g 200 mL/hr over 30 Minutes Intravenous Every 24 hours 01/12/23 0855     01/11/23 1600  fluconazole (DIFLUCAN) tablet 100 mg        100 mg Oral Daily 01/11/23  1510 01/16/23 0959   01/10/23 1500  ciprofloxacin (CIPRO) IVPB 400 mg  Status:  Discontinued        400 mg 200 mL/hr over 60 Minutes Intravenous Every 12 hours 01/10/23 1213 01/12/23 0854   01/10/23 1500  metroNIDAZOLE (FLAGYL) IVPB 500 mg        500 mg 100 mL/hr over 60 Minutes Intravenous Every 12 hours 01/10/23 1213     01/10/23 1300  metroNIDAZOLE (FLAGYL) IVPB 500 mg  Status:  Discontinued        500 mg 100 mL/hr over 60 Minutes Intravenous Every 12 hours 01/10/23 1136 01/10/23 1213   01/10/23 1230  ciprofloxacin (CIPRO) IVPB 400 mg  Status:  Discontinued        400 mg 200 mL/hr over 60 Minutes Intravenous Every 12 hours 01/10/23 1135 01/10/23 1213   01/09/23 2100  cefTRIAXone (ROCEPHIN) 2 g in sodium chloride 0.9 % 100 mL IVPB       Placed in "And" Linked Group   2 g 200 mL/hr over 30 Minutes Intravenous  Once 01/09/23 2053 01/09/23 2208   01/09/23 2100  metroNIDAZOLE (FLAGYL) IVPB 500 mg       Placed in "And" Linked Group   500 mg 100 mL/hr over 60 Minutes Intravenous  Once 01/09/23 2053 01/09/23 2324        Medications  Scheduled Meds:  apixaban  5 mg Oral BID   fluconazole  100 mg Oral Daily   levothyroxine  25 mcg Oral Q0600   magic mouthwash w/lidocaine  15 mL Oral QID   pantoprazole  40 mg Oral Q0600   Continuous Infusions:  cefTRIAXone (ROCEPHIN)  IV     lactated ringers Stopped (01/11/23 1754)   metronidazole 500 mg (01/12/23 0402)   PRN Meds:.acetaminophen **OR** acetaminophen, morphine injection, oxyCODONE    Subjective:   Amber Bryant was seen and examined today.  Abdominal pain , bloody bowel movements.   Objective:   Vitals:   01/11/23 0657 01/11/23 1237 01/11/23 2205 01/12/23 0632  BP: (!) 106/54 (!) 110/59 106/71 119/60  Pulse: 82 77 82 75  Resp: 16 18 17 16   Temp: 97.8 F (36.6 C) 98.2 F (36.8 C) 98.5 F (36.9 C) 98 F (36.7 C)  TempSrc:      SpO2: 97% 97% 94% 94%  Weight:      Height:        Intake/Output Summary (Last 24  hours) at 01/12/2023 1215 Last data filed at 01/12/2023 0800 Gross per 24 hour  Intake 3442.81 ml  Output 300 ml  Net 3142.81 ml   Filed Weights   01/09/23 1802 01/10/23 0007  Weight: 68 kg 72 kg     Exam General exam: ill appearing lady, in mild distress.  Respiratory system: Clear to auscultation. Respiratory effort normal. Cardiovascular system: S1 & S2 heard, RRR.  Gastrointestinal system: Abdomen is soft, with generalized tenderness.  Central nervous system: Alert and oriented.  Extremities: no pedal edema.  Skin: No rashes,  Psychiatry: Judgement and insight appear normal. Mood & affect appropriate.       Data Reviewed:  I have personally reviewed following labs and imaging studies   CBC Lab Results  Component Value Date   WBC 10.5 01/12/2023   RBC 3.26 (L) 01/12/2023   HGB 9.9 (L) 01/12/2023   HCT 30.5 (L) 01/12/2023   MCV 93.6 01/12/2023   MCH 30.4 01/12/2023   PLT 101 (L) 01/12/2023   MCHC 32.5 01/12/2023   RDW 14.0 01/12/2023   LYMPHSABS 1.0 01/09/2023   MONOABS 0.9 01/09/2023   EOSABS 0.0 01/09/2023   BASOSABS 0.0 01/09/2023     Last metabolic panel Lab Results  Component Value Date   NA 134 (L) 01/11/2023   K 2.9 (L) 01/11/2023   CL 100 01/11/2023   CO2 26 01/11/2023   BUN 11 01/11/2023   CREATININE 0.58 01/11/2023   GLUCOSE 116 (H) 01/11/2023   GFRNONAA >60 01/11/2023   GFRAA >60 08/14/2016   CALCIUM 7.9 (L) 01/11/2023   PHOS 2.5 01/11/2023   PROT 6.8 01/09/2023   ALBUMIN 2.8 (L) 01/09/2023   BILITOT 0.8 01/09/2023   ALKPHOS 104 01/09/2023   AST 20 01/09/2023   ALT 12 01/09/2023   ANIONGAP 8 01/11/2023    CBG (last 3)  No results for input(s): "GLUCAP" in the last 72 hours.    Coagulation Profile: No results for input(s): "INR", "PROTIME" in the last 168 hours.   Radiology Studies: CT CHEST W CONTRAST  Result Date: 01/11/2023 CLINICAL DATA:  Evaluate for occult malignancy. EXAM: CT CHEST WITH CONTRAST TECHNIQUE:  Multidetector CT imaging of the chest was performed during intravenous contrast administration. Performed in conjunction with CT of the abdomen and pelvis, reported separately. RADIATION DOSE REDUCTION: This exam was performed according to the departmental dose-optimization program which includes automated exposure control, adjustment of the mA and/or kV according to patient size and/or use of iterative reconstruction technique. CONTRAST:  OMNIPAQUE IOHEXOL 350 MG/ML SOLN COMPARISON:  Remote chest CT 11/10/2007 FINDINGS: Cardiovascular: Aortic atherosclerosis. No aneurysm or evidence of acute aortic abnormality. Calcified and irregular noncalcified atheromatous plaque. There is no central pulmonary embolus on this exam not tailored to pulmonary artery assessment. The heart is normal in size. No pericardial effusion. Mediastinum/Nodes: Scattered small mediastinal lymph nodes are not enlarged by size criteria. No hilar adenopathy. No enlarged axillary nodes. No esophageal wall thickening. No thyroid nodule. Lungs/Pleura: Ground-glass nodular opacities in both lungs. Largest ground-glass opacity is within the periphery of the right upper lobe and measures 1.3 x 1.5 x 1.6 cm, series 8, image 65. There is a slightly more confluent area inferiorly but no definite solid component. More faint ground-glass area of nodularity in the left upper lobe measures approximately 11 mm series 8, image 58. lesser areas of ill-defined ground-glass in both lungs. Irregular primarily linear opacity in the anterior left upper lobe measures 8 mm series 8, image 94. There are small bilateral pleural effusions. Minimal pleural fluid tracks into the right major fissure. Upper Abdomen: Assessed on concurrent abdominopelvic CT, reported separately. Musculoskeletal: Exaggerated thoracic kyphosis with spondylosis. No focal bone lesion or acute osseous findings. No  obvious breast mass by CT. IMPRESSION: 1. Ground-glass nodular opacities in  both lungs, largest in the right upper lobe measuring 1.3 x 1.5 x 1.6 cm. There is a slightly more confluent area inferiorly but no definite solid component. Findings are nonspecific and may be infectious or inflammatory, however malignancy is not excluded. Per Fleischner Society Guidelines, recommend a non-contrast Chest CT at 3-6 months. Subsequent management based on the most suspicious nodule(s). These guidelines do not apply to immunocompromised patients and patients with cancer. Follow up in patients with significant comorbidities as clinically warranted. For lung cancer screening, adhere to Lung-RADS guidelines. Reference: Radiology. 2017; 284(1):228-43. 2. Small bilateral pleural effusions. Aortic Atherosclerosis (ICD10-I70.0). Electronically Signed   By: Narda Rutherford M.D.   On: 01/11/2023 14:23   CT ANGIO ABDOMEN PELVIS  W & WO CONTRAST  Result Date: 01/11/2023 CLINICAL DATA:  Suspected ischemic colitis-known SMV and IMV thrombus. Assess arterial inflow. EXAM: CTA ABDOMEN AND PELVIS WITHOUT AND WITH CONTRAST TECHNIQUE: Multidetector CT imaging of the abdomen and pelvis was performed using the standard protocol during bolus administration of intravenous contrast. Multiplanar reconstructed images and MIPs were obtained and reviewed to evaluate the vascular anatomy. RADIATION DOSE REDUCTION: This exam was performed according to the departmental dose-optimization program which includes automated exposure control, adjustment of the mA and/or kV according to patient size and/or use of iterative reconstruction technique. CONTRAST:  OMNIPAQUE IOHEXOL 350 MG/ML SOLN COMPARISON:  CT abdomen/pelvis 01/09/2023 FINDINGS: VASCULAR Aorta: Normal in caliber. No evidence of aneurysm or dissection. Multifocal atherosclerotic plaque. Celiac: Patent without evidence of aneurysm, dissection, vasculitis or significant stenosis. SMA: Fibrofatty atherosclerotic plaque results in mild stenosis of the proximal IMA. The  degree of stenosis is unlikely to be hemodynamically significant. The vessel remains patent. Renals: Multiple renal arteries bilaterally. There are 2 right-sided renal arteries which appear code dominant. On the left, there is a dominant renal artery and a small accessory artery to the lower pole. No significant stenosis, dissection, aneurysm or changes of fibromuscular dysplasia. IMA: Patent without evidence of aneurysm, dissection, vasculitis or significant stenosis. Inflow: Patent without evidence of aneurysm, dissection, vasculitis or significant stenosis. Proximal Outflow: Bilateral common femoral and visualized portions of the superficial and profunda femoral arteries are patent without evidence of aneurysm, dissection, vasculitis or significant stenosis. Veins: Similar appearance of subocclusive thrombus within the superior mesenteric vein. Slight interval progression of segmental nearly occlusive thrombus within the inferior mesenteric vein. No extension into the splenic or portal vein. Hepatic veins are patent. Renal veins are patent. IVC and iliac veins are patent. Review of the MIP images confirms the above findings. NON-VASCULAR Lower chest: See concurrently obtained but separately dictated CT scan of the chest. Hepatobiliary: Normal hepatic contour and morphology. No discrete hepatic lesion. Small stones layer within the gallbladder lumen. Pancreas: Unremarkable. No pancreatic ductal dilatation or surrounding inflammatory changes. Spleen: Normal in size without focal abnormality. Adrenals/Urinary Tract: Normal adrenal glands. No hydronephrosis, nephrolithiasis or enhancing renal mass. Multifocal simple renal cysts including a large 8.2 cm simple cyst exophytic from the lower pole of the right kidney. Additionally, there are multiple simple parapelvic renal sinus cysts bilaterally. No imaging follow-up is recommended for any of these simple cystic lesions. Unremarkable ureters and bladder. Stomach/Bowel:  Persistent diffuse submucosal edema affecting the entire colon but with evidence of hypervascularity and extensive pericolonic stranding. No involvement of the stomach or small bowel. No evidence of obstruction. Lymphatic: No suspicious lymphadenopathy. Likely reactive lymph nodes present within the ileocolic mesentery. Reproductive: Status  post hysterectomy. No adnexal masses. Other: No abdominal wall hernia or abnormality. No abdominopelvic ascites. Musculoskeletal: No acute fracture or aggressive appearing lytic or blastic osseous lesion. Mild multilevel degenerative disc disease. IMPRESSION: VASCULAR 1. No significant stenosis, dissection or occlusion of the SMA or IMA to provide a source for acute or chronic mesenteric ischemia. 2. There is relatively mild stenosis of the SMA due to fibrofatty atherosclerotic plaque but this does not appear to be hemodynamically significant. 3. Stable appearance of subocclusive thrombus within the superior mesenteric vein. 4. Slight interval progression of nearly occlusive thrombus within the inferior mesenteric vein. 5. No evidence of propagation into the splenic or portal veins. 6. Extensive calcified atherosclerotic plaque including throughout the aorta. Aortic Atherosclerosis (ICD10-I70.0). NON-VASCULAR 1. Similar appearance of hand colitis. Differential considerations include both infectious and inflammatory etiologies. C difficile and ulcerative colitis are both within the differential. 2. Additional ancillary findings as above without interval change. Electronically Signed   By: Malachy Moan M.D.   On: 01/11/2023 12:05       Kathlen Mody M.D. [facility] 01/12/2023, 12:15 PM  Available via Epic secure chat 7am-7pm After 7 pm, please refer to night coverage provider listed on amion.

## 2023-01-12 NOTE — Evaluation (Signed)
Physical Therapy Evaluation Patient Details Name: Amber Bryant MRN: [id number] DOB: 1928/08/18 Today's Date: 01/12/2023  History of Present Illness  89 y.o. female with medical history significant of hypothyroidism, hypertension, hyperlipidemia , dementia, who presented to the emergency department with hematochezia and abdominal pain.  Patient was seen several weeks ago and diagnosed with colitis and discharged on outpatient regimen.  Patient states that since discharge she has had persistent blood in her stool and intermittent abdominal pain.  Clinical Impression  On eval, pt required Min A+2 for safe mobility. She was able to perform a stand pivot x2, bed<>bsc, using a RW. Pt reported fatigue and declined to attempt ambulation on today. Assisted pt back to bed at her request.Recommend return to facility once medically ready. Patient will benefit from continued inpatient follow up therapy, <3 hours/day         Assistance Recommended at Discharge Frequent or constant Supervision/Assistance  If plan is discharge home, recommend the following:  Can travel by private vehicle  Assistance with cooking/housework;Assist for transportation;Help with stairs or ramp for entrance;A lot of help with walking and/or transfers;A lot of help with bathing/dressing/bathroom        Equipment Recommendations None recommended by PT  Recommendations for Other Services       Functional Status Assessment Patient has had a recent decline in their functional status and demonstrates the ability to make significant improvements in function in a reasonable and predictable amount of time.     Precautions / Restrictions Precautions Precautions: Fall Restrictions Weight Bearing Restrictions: No      Mobility  Bed Mobility Overal bed mobility: Needs Assistance Bed Mobility: Supine to Sit     Supine to sit: Min assist, HOB elevated Sit to supine: Mod assist   General bed mobility comments: + use of rail,  assist to raise trunk and for LEs back into bed    Transfers Overall transfer level: Needs assistance Equipment used: Rolling walker (2 wheels) Transfers: Sit to/from Stand Sit to Stand: +2 physical assistance, Min assist   Step pivot transfers: Min assist, +2 safety/equipment       General transfer comment: assist to rise and steady. increased time. cues for safety.    Ambulation/Gait               General Gait Details: nt-pt declined 2* fatigue  Stairs            Wheelchair Mobility     Tilt Bed    Modified Rankin (Stroke Patients Only)       Balance Overall balance assessment: Needs assistance         Standing balance support: Bilateral upper extremity supported, Reliant on assistive device for balance, During functional activity Standing balance-Leahy Scale: Poor                               Pertinent Vitals/Pain Pain Assessment Pain Assessment: Faces Faces Pain Scale: Hurts little more Pain Location: buttocks Pain Descriptors / Indicators: Guarding, Sore Pain Intervention(s): Limited activity within patient's tolerance, Repositioned    Home Living Family/patient expects to be discharged to:: Skilled nursing facility                 Home Equipment: Agricultural consultant (2 wheels)      Prior Function Prior Level of Function : Needs assist             Mobility Comments: walks with RW ADLs Comments: assist  for bathing, dressing, IADLs     Hand Dominance   Dominant Hand: Right    Extremity/Trunk Assessment   Upper Extremity Assessment Upper Extremity Assessment: Generalized weakness    Lower Extremity Assessment Lower Extremity Assessment: Generalized weakness    Cervical / Trunk Assessment Cervical / Trunk Assessment: Other exceptions (increased habitus)  Communication   Communication: No difficulties;HOH  Cognition Arousal/Alertness: Awake/alert Behavior During Therapy: WFL for tasks  assessed/performed Overall Cognitive Status: History of cognitive impairments - at baseline                                 General Comments: fastidious, refusing some medical care        General Comments      Exercises     Assessment/Plan    PT Assessment Patient needs continued PT services  PT Problem List Decreased strength;Decreased range of motion;Decreased activity tolerance;Decreased balance;Decreased mobility;Decreased knowledge of use of DME;Pain       PT Treatment Interventions DME instruction;Therapeutic exercise;Gait training;Balance training;Functional mobility training;Therapeutic activities;Patient/family education    PT Goals (Current goals can be found in the Care Plan section)  Acute Rehab PT Goals Patient Stated Goal: none stated PT Goal Formulation: With patient Time For Goal Achievement: 01/26/23 Potential to Achieve Goals: Good    Frequency Min 1X/week     Co-evaluation   Reason for Co-Treatment: For patient/therapist safety   OT goals addressed during session: ADL's and self-care       AM-PAC PT "6 Clicks" Mobility  Outcome Measure Help needed turning from your back to your side while in a flat bed without using bedrails?: A Little Help needed moving from lying on your back to sitting on the side of a flat bed without using bedrails?: A Little Help needed moving to and from a bed to a chair (including a wheelchair)?: A Little Help needed standing up from a chair using your arms (e.g., wheelchair or bedside chair)?: A Lot Help needed to walk in hospital room?: A Lot Help needed climbing 3-5 steps with a railing? : Total 6 Click Score: 14    End of Session   Activity Tolerance: Patient tolerated treatment well;Patient limited by fatigue Patient left: in bed;with call bell/phone within reach;with bed alarm set   PT Visit Diagnosis: Muscle weakness (generalized) (M62.81);Difficulty in walking, not elsewhere classified (R26.2)     Time: 2956-2130 PT Time Calculation (min) (ACUTE ONLY): 26 min   Charges:       PT General Charges $$ ACUTE PT VISIT: 1 Visit            Faye Ramsay, PT Acute Rehabilitation  Office: [phone]

## 2023-01-12 NOTE — Evaluation (Signed)
Clinical/Bedside Swallow Evaluation Patient Details  Name: Amber Bryant MRN: [id number] Date of Birth: 1928/12/11  Today's Date: 01/12/2023 Time: SLP Start Time (ACUTE ONLY): 0845 SLP Stop Time (ACUTE ONLY): 0910 SLP Time Calculation (min) (ACUTE ONLY): 25 min  Past Medical History:  Past Medical History:  Diagnosis Date   Arthritis    Closed left femoral fracture (HCC) 2011-02-17   s/p ORIF, Dr. Ave Filter   Closed left radial fracture 17-Feb-2011   s/p reduction   Complication of anesthesia 08/14/2016   not able to think clear for a long time afterwards   Essential hypertension    Hyperlipidemia    Hypothyroidism    Past Surgical History:  Past Surgical History:  Procedure Laterality Date   ABDOMINAL HYSTERECTOMY     CATARACT EXTRACTION W/ INTRAOCULAR LENS  IMPLANT, BILATERAL Bilateral 2014   COLONOSCOPY     ORIF FEMUR FRACTURE Left 04/08/2011   Dr. Ave Filter   TOTAL HIP ARTHROPLASTY Left 08/20/2016   Procedure: TOTAL HIP ARTHROPLASTY POSTERIOR REMOVE TROCH NAIL;  Surgeon: Gean Birchwood, MD;  Location: [facility] OR;  Service: Orthopedics;  Laterality: Left;   HPI:  Patient is a 89 y.o. female with PMH: hypothyroidism, hypertension, hyperlipidemia who was recently seen at the hospital several weeks ago and was diagnosed with colitis and discharged to OP services. She has had persistent blood in stool and abdominal pain since this previous admission. She resides at [facility]. She presented to hospital on 01/09/23 secondary to nursing at [facility] was concerned about her pain and hematochezia. She was hemodynamically stable on arrival to ED. 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 SLP swallow evaluation ordered on 01/11/23 but patient refused evaluation when attempted on that date.    Assessment / Plan / Recommendation  Clinical Impression  Patient presenting with questionable dysphagia as per this bedside swallow evaluation which was limited due to  patient only willing to consume a spoonful of italian ice. She reports it has been "a while" since she has had anything to eat or drink and she does not have an appetite. She does desire fluids/liquids at times. She exhibited a delayed cough after eating italian ice, however unable to determine if this was secondary to this being the first things she has had to swallow in a while, as she did consume any more PO's. Patient told SLP that she was tired of all the testing and that "palliative and hospice comes to mind". Her husband passed away in Feb 17, 2016 and hospice was involved, which gave him relief from the pain he was enduring and so patient has some understanding of this process. She also requested a Rabbi and so SLP spoke with her about talking to a hospital chaplain. SLP will follow briefly to ensure patient is tolerating PO's. SLP Visit Diagnosis: Dysphagia, unspecified (R13.10)    Aspiration Risk  Mild aspiration risk    Diet Recommendation Regular;Thin liquid    Liquid Administration via: Cup;Straw Medication Administration: Other (Comment) (as tolerated) Supervision: Patient able to self feed Compensations: Slow rate;Small sips/bites Postural Changes: Seated upright at 90 degrees    Other  Recommendations Oral Care Recommendations: Oral care BID    Recommendations for follow up therapy are one component of a multi-disciplinary discharge planning process, led by the attending physician.  Recommendations may be updated based on patient status, additional functional criteria and insurance authorization.  Follow up Recommendations No SLP follow up      Assistance Recommended at Discharge  Functional Status Assessment Patient has had a recent decline in their functional status and demonstrates the ability to make significant improvements in function in a reasonable and predictable amount of time.  Frequency and Duration            Prognosis Prognosis for improved oropharyngeal function:  Fair      Swallow Study   General Date of Onset: 01/12/23 HPI: Patient is a 89 y.o. female with PMH: hypothyroidism, hypertension, hyperlipidemia who was recently seen at the hospital several weeks ago and was diagnosed with colitis and discharged to OP services. She has had persistent blood in stool and abdominal pain since this previous admission. She resides at [facility]. She presented to hospital on 01/09/23 secondary to nursing at [facility] was concerned about her pain and hematochezia. She was hemodynamically stable on arrival to ED. 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 SLP swallow evaluation ordered on 01/11/23 but patient refused evaluation when attempted on that date. Type of Study: Bedside Swallow Evaluation Previous Swallow Assessment: none found Diet Prior to this Study: Regular;Thin liquids (Level 0) Temperature Spikes Noted: No Respiratory Status: Room air History of Recent Intubation: No Behavior/Cognition: Alert;Cooperative;Pleasant mood Oral Cavity Assessment: Dry;Within Functional Limits Oral Care Completed by SLP: No Oral Cavity - Dentition: Adequate natural dentition Vision: Functional for self-feeding Self-Feeding Abilities: Able to feed self Patient Positioning: Upright in bed Baseline Vocal Quality: Normal Volitional Cough: Strong Volitional Swallow: Able to elicit    Oral/Motor/Sensory Function Overall Oral Motor/Sensory Function: Within functional limits   Ice Chips     Thin Liquid Thin Liquid: Impaired Presentation: Self Fed;Spoon Pharyngeal  Phase Impairments: Cough - Delayed Other Comments: patient had delayed cough after taking a bite of italian ice.    Nectar Thick Nectar Thick Liquid: Not tested   Honey Thick Honey Thick Liquid: Not tested   Puree Puree: Not tested   Solid     Solid: Not tested     Angela Nevin, MA, CCC-SLP Speech Therapy

## 2023-01-12 NOTE — Plan of Care (Signed)
  Problem: Coping: Goal: Level of anxiety will decrease 01/12/2023 2333 by Kizzie Bane, RN Outcome: Progressing 01/12/2023 2333 by Kizzie Bane, RN Outcome: Progressing   Problem: Pain Managment: Goal: General experience of comfort will improve 01/12/2023 2333 by Kizzie Bane, RN Outcome: Progressing 01/12/2023 2333 by Kizzie Bane, RN Outcome: Progressing   Problem: Safety: Goal: Ability to remain free from injury will improve 01/12/2023 2333 by Kizzie Bane, RN Outcome: Progressing 01/12/2023 2333 by Kizzie Bane, RN Outcome: Progressing

## 2023-01-12 NOTE — Progress Notes (Signed)
[facility] Subjective: Patient seen and examined chart reviewed.  After detailed discussion with the patient and with Dr. Blake Divine, at the patient's request palliative care has been consulted as the patient does not want any active treatment wants to be comfortable as she is 89 years of age and is suffering with multiple medical issues at this time. She claims she has been up all night with rectal bleeding and with problems with IV access.  She has had some diffuse abdominal pain.  Objective: Vital signs in last 24 hours: Temp:  [98 F (36.7 C)-98.5 F (36.9 C)] 98 F (36.7 C) (07/27 4098) Pulse Rate:  [75-82] 75 (07/27 0632) Resp:  [16-18] 16 (07/27 1191) BP: (106-119)/(59-71) 119/60 (07/27 4782) SpO2:  [94 %-97 %] 94 % (07/27 0632) Last BM Date : 01/11/23  Intake/Output from previous day: 07/26 0701 - 07/27 0700 In: 3442.8 [P.O.:120; I.V.:2098.9; IV Piggyback:1223.9] Out: -  Intake/Output this shift: Total I/O In: -  Out: 300 [Stool:300]  General appearance: alert, cooperative, appears stated age, and mild distress Resp: clear to auscultation bilaterally Cardio: regular rate and rhythm, S1, S2 normal, no murmur, click, rub or gallop GI: soft, non-tender; bowel sounds normal; no masses,  no organomegaly  Lab Results: Recent Labs    01/10/23 0329 01/10/23 1703 01/11/23 0138 01/11/23 1039 01/11/23 1824 01/12/23 0023 01/12/23 0359  WBC 12.8*  --  12.6*  --   --   --  10.5  HGB 11.6*   < > 11.4*   < > 10.6* 10.0* 9.9*  HCT 36.4   < > 35.9*   < > 33.6* 31.4* 30.5*  PLT 142*  --  128*  --   --   --  101*   < > = values in this interval not displayed.   BMET Recent Labs    01/09/23 1900 01/10/23 0329 01/11/23 1039  NA 134* 134* 134*  K 3.1* 3.1* 2.9*  CL 96* 98 100  CO2 25 25 26   GLUCOSE 125* 135* 116*  BUN 13 10 11   CREATININE 0.66 0.75 0.58  CALCIUM 8.6* 8.2* 7.9*   LFT Recent Labs    01/09/23 1900  PROT 6.8  ALBUMIN 2.8*  AST 20  ALT 12  ALKPHOS  104  BILITOT 0.8   PT/INR No results for input(s): "LABPROT", "INR" in the last 72 hours. Hepatitis Panel No results for input(s): "HEPBSAG", "HCVAB", "HEPAIGM", "HEPBIGM" in the last 72 hours. C-Diff Recent Labs    01/10/23 1420  CDIFFTOX NEGATIVE   No results for input(s): "CDIFFPCR" in the last 72 hours. Fecal Lactopherrin No results for input(s): "FECLLACTOFRN" in the last 72 hours.  Studies/Results: CT CHEST W CONTRAST  Result Date: 01/11/2023 CLINICAL DATA:  Evaluate for occult malignancy. EXAM: CT CHEST WITH CONTRAST TECHNIQUE: Multidetector CT imaging of the chest was performed during intravenous contrast administration. Performed in conjunction with CT of the abdomen and pelvis, reported separately. RADIATION DOSE REDUCTION: This exam was performed according to the departmental dose-optimization program which includes automated exposure control, adjustment of the mA and/or kV according to patient size and/or use of iterative reconstruction technique. CONTRAST:  OMNIPAQUE IOHEXOL 350 MG/ML SOLN COMPARISON:  Remote chest CT 11/10/2007 FINDINGS: Cardiovascular: Aortic atherosclerosis. No aneurysm or evidence of acute aortic abnormality. Calcified and irregular noncalcified atheromatous plaque. There is no central pulmonary embolus on this exam not tailored to pulmonary artery assessment. The heart is normal in size. No pericardial effusion. Mediastinum/Nodes: Scattered small mediastinal lymph nodes are not  enlarged by size criteria. No hilar adenopathy. No enlarged axillary nodes. No esophageal wall thickening. No thyroid nodule. Lungs/Pleura: Ground-glass nodular opacities in both lungs. Largest ground-glass opacity is within the periphery of the right upper lobe and measures 1.3 x 1.5 x 1.6 cm, series 8, image 65. There is a slightly more confluent area inferiorly but no definite solid component. More faint ground-glass area of nodularity in the left upper lobe measures  approximately 11 mm series 8, image 58. lesser areas of ill-defined ground-glass in both lungs. Irregular primarily linear opacity in the anterior left upper lobe measures 8 mm series 8, image 94. There are small bilateral pleural effusions. Minimal pleural fluid tracks into the right major fissure. Upper Abdomen: Assessed on concurrent abdominopelvic CT, reported separately. Musculoskeletal: Exaggerated thoracic kyphosis with spondylosis. No focal bone lesion or acute osseous findings. No obvious breast mass by CT. IMPRESSION: 1. Ground-glass nodular opacities in both lungs, largest in the right upper lobe measuring 1.3 x 1.5 x 1.6 cm. There is a slightly more confluent area inferiorly but no definite solid component. Findings are nonspecific and may be infectious or inflammatory, however malignancy is not excluded. Per Fleischner Society Guidelines, recommend a non-contrast Chest CT at 3-6 months. Subsequent management based on the most suspicious nodule(s). These guidelines do not apply to immunocompromised patients and patients with cancer. Follow up in patients with significant comorbidities as clinically warranted. For lung cancer screening, adhere to Lung-RADS guidelines. Reference: Radiology. 2017; 284(1):228-43. 2. Small bilateral pleural effusions. Aortic Atherosclerosis (ICD10-I70.0). Electronically Signed   By: Narda Rutherford M.D.   On: 01/11/2023 14:23   CT ANGIO ABDOMEN PELVIS  W & WO CONTRAST  Result Date: 01/11/2023 CLINICAL DATA:  Suspected ischemic colitis-known SMV and IMV thrombus. Assess arterial inflow. EXAM: CTA ABDOMEN AND PELVIS WITHOUT AND WITH CONTRAST TECHNIQUE: Multidetector CT imaging of the abdomen and pelvis was performed using the standard protocol during bolus administration of intravenous contrast. Multiplanar reconstructed images and MIPs were obtained and reviewed to evaluate the vascular anatomy. RADIATION DOSE REDUCTION: This exam was performed according to the  departmental dose-optimization program which includes automated exposure control, adjustment of the mA and/or kV according to patient size and/or use of iterative reconstruction technique. CONTRAST:  OMNIPAQUE IOHEXOL 350 MG/ML SOLN COMPARISON:  CT abdomen/pelvis 01/09/2023 FINDINGS: VASCULAR Aorta: Normal in caliber. No evidence of aneurysm or dissection. Multifocal atherosclerotic plaque. Celiac: Patent without evidence of aneurysm, dissection, vasculitis or significant stenosis. SMA: Fibrofatty atherosclerotic plaque results in mild stenosis of the proximal IMA. The degree of stenosis is unlikely to be hemodynamically significant. The vessel remains patent. Renals: Multiple renal arteries bilaterally. There are 2 right-sided renal arteries which appear code dominant. On the left, there is a dominant renal artery and a small accessory artery to the lower pole. No significant stenosis, dissection, aneurysm or changes of fibromuscular dysplasia. IMA: Patent without evidence of aneurysm, dissection, vasculitis or significant stenosis. Inflow: Patent without evidence of aneurysm, dissection, vasculitis or significant stenosis. Proximal Outflow: Bilateral common femoral and visualized portions of the superficial and profunda femoral arteries are patent without evidence of aneurysm, dissection, vasculitis or significant stenosis. Veins: Similar appearance of subocclusive thrombus within the superior mesenteric vein. Slight interval progression of segmental nearly occlusive thrombus within the inferior mesenteric vein. No extension into the splenic or portal vein. Hepatic veins are patent. Renal veins are patent. IVC and iliac veins are patent. Review of the MIP images confirms the above findings. NON-VASCULAR Lower chest: See concurrently obtained but  separately dictated CT scan of the chest. Hepatobiliary: Normal hepatic contour and morphology. No discrete hepatic lesion. Small stones layer within the gallbladder  lumen. Pancreas: Unremarkable. No pancreatic ductal dilatation or surrounding inflammatory changes. Spleen: Normal in size without focal abnormality. Adrenals/Urinary Tract: Normal adrenal glands. No hydronephrosis, nephrolithiasis or enhancing renal mass. Multifocal simple renal cysts including a large 8.2 cm simple cyst exophytic from the lower pole of the right kidney. Additionally, there are multiple simple parapelvic renal sinus cysts bilaterally. No imaging follow-up is recommended for any of these simple cystic lesions. Unremarkable ureters and bladder. Stomach/Bowel: Persistent diffuse submucosal edema affecting the entire colon but with evidence of hypervascularity and extensive pericolonic stranding. No involvement of the stomach or small bowel. No evidence of obstruction. Lymphatic: No suspicious lymphadenopathy. Likely reactive lymph nodes present within the ileocolic mesentery. Reproductive: Status post hysterectomy. No adnexal masses. Other: No abdominal wall hernia or abnormality. No abdominopelvic ascites. Musculoskeletal: No acute fracture or aggressive appearing lytic or blastic osseous lesion. Mild multilevel degenerative disc disease. IMPRESSION: VASCULAR 1. No significant stenosis, dissection or occlusion of the SMA or IMA to provide a source for acute or chronic mesenteric ischemia. 2. There is relatively mild stenosis of the SMA due to fibrofatty atherosclerotic plaque but this does not appear to be hemodynamically significant. 3. Stable appearance of subocclusive thrombus within the superior mesenteric vein. 4. Slight interval progression of nearly occlusive thrombus within the inferior mesenteric vein. 5. No evidence of propagation into the splenic or portal veins. 6. Extensive calcified atherosclerotic plaque including throughout the aorta. Aortic Atherosclerosis (ICD10-I70.0). NON-VASCULAR 1. Similar appearance of hand colitis. Differential considerations include both infectious and  inflammatory etiologies. C difficile and ulcerative colitis are both within the differential. 2. Additional ancillary findings as above without interval change. Electronically Signed   By: Malachy Moan M.D.   On: 01/11/2023 12:05    Medications: I have reviewed the patient's current medications. Prior to Admission:  Medications Prior to Admission  Medication Sig Dispense Refill Last Dose   acetaminophen (TYLENOL) 325 MG tablet Take 650 mg by mouth 3 (three) times daily as needed (for pain).   unk   azithromycin (ZITHROMAX) 500 MG tablet Take 1,000 mg by mouth See admin instructions. Take 1,000 mg by mouth one hour prior to ANY dental procedures   unk   levothyroxine (SYNTHROID) 25 MCG tablet Take 1 tablet (25 mcg total) by mouth every morning. (Patient taking differently: Take 25 mcg by mouth daily before breakfast.) 30 tablet 5 unk   omeprazole (PRILOSEC OTC) 20 MG tablet Take 40 mg by mouth daily before supper.   unk   ondansetron (ZOFRAN-ODT) 4 MG disintegrating tablet Take 4 mg by mouth every 6 (six) hours as needed for nausea or vomiting.   unk   polyethylene glycol (MIRALAX / GLYCOLAX) 17 g packet Take 17 g by mouth daily as needed for mild constipation (to be mixed into 6-8 ounces of fluid).   unk   potassium chloride (KLOR-CON) 10 MEQ tablet Take 10 mEq by mouth daily.   unk   Cholecalciferol (VITAMIN D3) 50 MCG (2000 UT) TABS Take 2,000 Units by mouth in the morning. (Patient not taking: Reported on 01/09/2023)   Not Taking   Cholecalciferol 50 MCG (2000 UT) CAPS Take 1 capsule (2,000 Units total) by mouth daily. (Patient not taking: Reported on 01/09/2023) 90 capsule 3 Not Taking   hydrochlorothiazide (HYDRODIURIL) 25 MG tablet Take 0.5 tablets (12.5 mg total) by mouth daily. (Patient not taking:  Reported on 01/09/2023) 15 tablet 5 Not Taking   lovastatin (MEVACOR) 20 MG tablet TAKE 1 TABLET BY MOUTH EVERY MORNING (Patient not taking: Reported on 01/09/2023) 90 tablet 1 Not Taking    Lutein-Zeaxanthin 25-5 MG CAPS Take 1 capsule by mouth daily at 6 (six) AM. (Patient not taking: Reported on 01/09/2023)   Not Taking   vitamin C (ASCORBIC ACID) 250 MG tablet Take 125 mg by mouth daily. (Patient not taking: Reported on 01/09/2023)   Not Taking   Scheduled:  fluconazole  100 mg Oral Daily   levothyroxine  25 mcg Oral Q0600   magic mouthwash w/lidocaine  15 mL Oral QID   pantoprazole  40 mg Oral Q0600   Continuous:  cefTRIAXone (ROCEPHIN)  IV     heparin 1,300 Units/hr (01/11/23 1817)   lactated ringers Stopped (01/11/23 1754)   metronidazole 500 mg (01/12/23 0402)   PPI:[id number] **OR** acetaminophen, oxyCODONE Anti-infectives (From admission, onward)    Start     Dose/Rate Route Frequency Ordered Stop   01/12/23 1400  cefTRIAXone (ROCEPHIN) 2 g in sodium chloride 0.9 % 100 mL IVPB        2 g 200 mL/hr over 30 Minutes Intravenous Every 24 hours 01/12/23 0855     01/11/23 1600  fluconazole (DIFLUCAN) tablet 100 mg        100 mg Oral Daily 01/11/23 1510 01/16/23 0959   01/10/23 1500  ciprofloxacin (CIPRO) IVPB 400 mg  Status:  Discontinued        400 mg 200 mL/hr over 60 Minutes Intravenous Every 12 hours 01/10/23 1213 01/12/23 0854   01/10/23 1500  metroNIDAZOLE (FLAGYL) IVPB 500 mg        500 mg 100 mL/hr over 60 Minutes Intravenous Every 12 hours 01/10/23 1213     01/10/23 1300  metroNIDAZOLE (FLAGYL) IVPB 500 mg  Status:  Discontinued        500 mg 100 mL/hr over 60 Minutes Intravenous Every 12 hours 01/10/23 1136 01/10/23 1213   01/10/23 1230  ciprofloxacin (CIPRO) IVPB 400 mg  Status:  Discontinued        400 mg 200 mL/hr over 60 Minutes Intravenous Every 12 hours 01/10/23 1135 01/10/23 1213   01/09/23 2100  cefTRIAXone (ROCEPHIN) 2 g in sodium chloride 0.9 % 100 mL IVPB       Placed in "And" Linked Group   2 g 200 mL/hr over 30 Minutes Intravenous  Once 01/09/23 2053 01/09/23 2208   01/09/23 2100  metroNIDAZOLE (FLAGYL) IVPB 500 mg       Placed in  "And" Linked Group   500 mg 100 mL/hr over 60 Minutes Intravenous  Once 01/09/23 2053 01/09/23 2324       Assessment/Plan: 1) Possible ischemic colitis on heparin-multiple bloody bowel movements-anemia due to GI blood loss with new SMV thrombus also causing IMV obstruction-palliative care has been consulted.  We will sign off please call if further assistance is needed. 2) Pancreatic tail cyst measuring 7 mm. 3) HTN/hyperlipidemia. 4) Hypothyroidism.   LOS: 3 days   Charna Elizabeth 01/12/2023, 10:38 AM

## 2023-01-12 NOTE — Progress Notes (Signed)
   01/12/23 1100  Spiritual Encounters  Type of Visit Initial  Care provided to: Patient  Conversation partners present during encounter Nurse  Referral source Chaplain assessment  Reason for visit Routine spiritual support  OnCall Visit Yes  Spiritual Framework  Presenting Themes Meaning/purpose/sources of inspiration;Values and beliefs;Significant life change;Impactful experiences and emotions;Courage hope and growth  Community/Connection Family;Limited  Patient Stress Factors Loss;Health changes  Family Stress Factors None identified  Interventions  Spiritual Care Interventions Made Explored ethical dilemma;Normalization of emotions;Reflective listening;Established relationship of care and support;Decision-making support/facilitation  Intervention Outcomes  Outcomes Awareness of health;Autonomy/agency;Reduced fear;Reduced isolation  Spiritual Care Plan  Spiritual Care Issues Still Outstanding Chaplain will continue to follow   Chaplain met with Patient at bedside. Raised Jewish home and does not wish to pray but wants to talk of her ethicla delimia of palliative care and hospice decision.  Provided spiritual care for her as we chatted about her life narrative, her husband and her two adopted adult daughters.  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    Would like a follow up visit.

## 2023-01-12 NOTE — Progress Notes (Signed)
ANTICOAGULATION CONSULT NOTE - follow up  Pharmacy Consult for heparin Indication: VTE treatment  Allergies  Allergen Reactions   Onion Other (See Comments)    Excessive sneezing   Shellfish Allergy Shortness Of Breath   Ambien [Zolpidem] Other (See Comments)    "Allergic," per facility   Amoxicillin Other (See Comments)    "Allergic," per facility    Patient Measurements: Height: 5\' 7"  (170.2 cm) Weight: 72 kg (158 lb 11.7 oz) IBW/kg (Calculated) : 61.6 Heparin Dosing Weight: 68kg  Vital Signs: Temp: 98.5 F (36.9 C) (07/26 2205) BP: 106/71 (07/26 2205) Pulse Rate: 82 (07/26 2205)  Labs: Recent Labs    01/09/23 1900 01/10/23 0329 01/10/23 1116 01/10/23 2107 01/11/23 0138 01/11/23 0711 01/11/23 1039 01/11/23 1824 01/12/23 0023 01/12/23 0359  HGB 12.9 11.6*   < >  --  11.4*  --  10.4* 10.6* 10.0* 9.9*  HCT 40.3 36.4   < >  --  35.9*  --  32.5* 33.6* 31.4* 30.5*  PLT 196 142*  --   --  128*  --   --   --   --  101*  HEPARINUNFRC  --   --    < > 0.30  --  0.36  --   --   --  0.31  CREATININE 0.66 0.75  --   --   --   --  0.58  --   --   --    < > = values in this interval not displayed.    Estimated Creatinine Clearance: 43.6 mL/min (by C-G formula based on SCr of 0.58 mg/dL).   Medical History: Past Medical History:  Diagnosis Date   Arthritis    Closed left femoral fracture (HCC) 2012   s/p ORIF, Dr. Ave Filter   Closed left radial fracture 2012   s/p reduction   Complication of anesthesia 08/14/2016   not able to think clear for a long time afterwards   Essential hypertension    Hyperlipidemia    Hypothyroidism      Assessment: 89 y.o. female with medical history significant of hypothyroidism, hypertension, hyperlipidemia who presented to the emergency department with hematochezia and abdominal pain. Hematochezia most likely secondary to ischemic colitis, IMV clots noted on CT, pharmacy to dose heparin, no prior AC noted.   01/12/2023 Heparin  level today is therapeutic at 0.31, on 1300 units/hr.  Hgb down 9.9, plt down 101  Patient continued to have bloody BMs yesterday but none this morning, per nursing. Although heparin level is at lower end of therapeutic range, will continue with same heparin rate given decreasing platelets and continued bloody BMs.  Goal of Therapy:  Heparin level 0.3-0.7 units/ml Monitor platelets by anticoagulation protocol: Yes   Plan: Continue heparin drip at 1300 units/hr Daily HL and CBC   Arley Phenix RPh 01/12/2023, 5:07 AM

## 2023-01-13 DIAGNOSIS — B370 Candidal stomatitis: Secondary | ICD-10-CM | POA: Diagnosis not present

## 2023-01-13 DIAGNOSIS — R4589 Other symptoms and signs involving emotional state: Secondary | ICD-10-CM

## 2023-01-13 DIAGNOSIS — E039 Hypothyroidism, unspecified: Secondary | ICD-10-CM | POA: Diagnosis not present

## 2023-01-13 DIAGNOSIS — R52 Pain, unspecified: Secondary | ICD-10-CM

## 2023-01-13 DIAGNOSIS — Z7189 Other specified counseling: Secondary | ICD-10-CM

## 2023-01-13 DIAGNOSIS — Z711 Person with feared health complaint in whom no diagnosis is made: Secondary | ICD-10-CM

## 2023-01-13 DIAGNOSIS — D62 Acute posthemorrhagic anemia: Secondary | ICD-10-CM | POA: Diagnosis not present

## 2023-01-13 DIAGNOSIS — Z66 Do not resuscitate: Secondary | ICD-10-CM

## 2023-01-13 DIAGNOSIS — K55069 Acute infarction of intestine, part and extent unspecified: Secondary | ICD-10-CM | POA: Diagnosis not present

## 2023-01-13 DIAGNOSIS — K529 Noninfective gastroenteritis and colitis, unspecified: Secondary | ICD-10-CM | POA: Diagnosis not present

## 2023-01-13 DIAGNOSIS — Z515 Encounter for palliative care: Secondary | ICD-10-CM

## 2023-01-13 MED ORDER — MORPHINE SULFATE (PF) 2 MG/ML IV SOLN: 1.0000 mg | INTRAVENOUS | Status: DC | PRN

## 2023-01-13 MED ORDER — ONDANSETRON 4 MG PO TBDP: 4.0000 mg | ORAL_TABLET | Freq: Four times a day (QID) | ORAL | Status: DC | PRN

## 2023-01-13 MED ORDER — HALOPERIDOL 0.5 MG PO TABS: 0.5000 mg | ORAL_TABLET | ORAL | Status: DC | PRN

## 2023-01-13 MED ORDER — FLUCONAZOLE IN SODIUM CHLORIDE 200-0.9 MG/100ML-% IV SOLN
200.0000 mg | Freq: Once | INTRAVENOUS | Status: DC
  Filled 2023-01-13: qty 100

## 2023-01-13 MED ORDER — POLYVINYL ALCOHOL 1.4 % OP SOLN: 1.0000 [drp] | Freq: Four times a day (QID) | OPHTHALMIC | Status: DC | PRN

## 2023-01-13 MED ORDER — MORPHINE SULFATE (PF) 2 MG/ML IV SOLN: 0.5000 mg | INTRAVENOUS | Status: DC | PRN

## 2023-01-13 MED ORDER — MAGNESIUM SULFATE 2 GM/50ML IV SOLN
2.0000 g | Freq: Once | INTRAVENOUS | Status: DC
  Filled 2023-01-13: qty 50

## 2023-01-13 MED ORDER — BIOTENE DRY MOUTH MT LIQD: 15.0000 mL | OROMUCOSAL | Status: DC | PRN

## 2023-01-13 MED ORDER — GLYCOPYRROLATE 0.2 MG/ML IJ SOLN: 0.2000 mg | INTRAMUSCULAR | Status: DC | PRN

## 2023-01-13 MED ORDER — ONDANSETRON HCL 4 MG/2ML IJ SOLN: 4.0000 mg | Freq: Four times a day (QID) | INTRAMUSCULAR | Status: DC | PRN

## 2023-01-13 MED ORDER — HALOPERIDOL LACTATE 2 MG/ML PO CONC: 0.5000 mg | ORAL | Status: DC | PRN

## 2023-01-13 MED ORDER — GLYCOPYRROLATE 1 MG PO TABS: 1.0000 mg | ORAL_TABLET | ORAL | Status: DC | PRN

## 2023-01-13 MED ORDER — HALOPERIDOL LACTATE 5 MG/ML IJ SOLN: 0.5000 mg | INTRAMUSCULAR | Status: DC | PRN

## 2023-01-13 MED ORDER — ACETAMINOPHEN 325 MG PO TABS: 650.0000 mg | ORAL_TABLET | Freq: Four times a day (QID) | ORAL | Status: DC | PRN

## 2023-01-13 NOTE — Consult Note (Signed)
Consultation Note Date: 01/13/2023   Patient Name: Amber Bryant  DOB: 1928/11/25  MRN: [id number]  Age / Sex: 89 y.o., female   PCP: Mahlon Gammon, MD Referring Physician: Kathlen Mody, MD  Reason for Consultation: Establishing goals of care     Chief Complaint/History of Present Illness:   Patient is a 89 year old female with a past medical history of hypothyroidism, hypertension, hyperlipidemia, and dementia who was admitted on 01/09/2023 for management of hematochezia and abdominal pain.  Patient had recently been seen several weeks ago and diagnosed with colitis and discharged on outpatient regimen.  Upon presentation to ER, CT imaging noted pancolitis and SMA and IMA venous thrombosis.  Patient has had continued at both sides of hematochezia.  Vascular and GI consulted for recommendations.  Palliative medicine team consulted to assist with complex medical decision making as per patient's request.  Extensive of review of EMR prior to presenting to bedside.  Patient has ACP documentation on file from 2009 naming HCPOA and primary alternate. Patient has been evaluated by SLP and noted to be a mild aspiration risk.  Presented to bedside to meet with patient.  Patient laying comfortably in bed and welcoming provider to visit.  Patient's daughter, Amber Bryant, at bedside as well.  Introduced myself as a member of the palliative medicine team and my role in patient's care. Spent time learning about patient's medical journey up into this point.  Patient expressed frustration with continued medical interventions and noting she wants to be allowed to be at peace and alone.  Spent lots of time discussing what patient was hoping for moving forward.  Patient notes desire to involve hospice in her care so that she can focus on comfort at the end of life.  With permission able to discuss extensively pathways for medical care moving forward including continuing with aggressive medical interventions versus  transition to comfort focused care at this time and involving hospice support.  After answering multiple questions regarding this, patient electing to pursue comfort focused care and involve hospice support at her facility.  Daughter supporting of patient's wishes regarding this. Patient had been long-term care resident in independent living at [facility].  Noted patient would need higher level of care with her continued weakened state which would be expected back to to progress.  Daughter going to assist with coordination between Child psychotherapist and [facility] for transition to skilled nursing facility side of [facility].  Discussed need to determine hospice provider.  Daughter will assist with determining this as well.  Patient was able to share fond memories of when her husband died at [facility] supported by [facility] or care hospice which was a positive experience for them.  Patient spent time showing pictures of her husband and how comfortable he was at the end of her life.  Patient acknowledged that she wants that so she can die in peace.  Encouraged patient to focus on quality time with visiting and any wishes she needs to express moving forward.  Spent time providing emotional support via active listening.  Also able to discuss CODE STATUS in addition to comfort focused care transition at this time.  Explained patient's CODE STATUS was currently noted to be full code.  Patient and daughter immediately stated that she should be DO NOT RESUSCITATE.  Noted with changes in EMR.  Also took time to review and complete MOST and DNR form for patient.  Noted MOST form can travel with patient to facility so that she is not transferred back to  the hospital.  Spent time answering all questions regarding this.  Both patient and daughter voiced great appreciation for discussion today.  Able to discuss care with IDT after visit with patient and transition to full comfort focused care at this time.  Primary  Diagnoses  Present on Admission:  Acute blood loss anemia   Past Medical History:  Diagnosis Date   Arthritis    Closed left femoral fracture (HCC) 2012   s/p ORIF, Dr. Ave Filter   Closed left radial fracture 2012   s/p reduction   Complication of anesthesia 08/14/2016   not able to think clear for a long time afterwards   Essential hypertension    Hyperlipidemia    Hypothyroidism    Social History   Socioeconomic History   Marital status: Widowed    Spouse name: Not on file   Number of children: Not on file   Years of education: Not on file   Highest education level: Not on file  Occupational History   Not on file  Tobacco Use   Smoking status: Never   Smokeless tobacco: Never  Vaping Use   Vaping status: Never Used  Substance and Sexual Activity   Alcohol use: No   Drug use: No   Sexual activity: Never  Other Topics Concern   Not on file  Social History Narrative   Tobacco use, amount per day now: NONE   Past tobacco use, amount per day: NONE   How many years did you use tobacco: NONE   Alcohol use (drinks per week): 1   Diet: ONGOING   Do you drink/eat things with caffeine: YES   Marital status: WIDOWED                       What year were you married? 1956   Do you live in a house, apartment, assisted living, condo, trailer, etc.? INDEPENDENT LIVING ([facility])   Is it one or more stories? ONE   How many persons live in your home? ONE   Do you have pets in your home?( please list) 3 DOGS   Current or past profession: PUBLISHER   Do you exercise?   YES                               Type and how often? SWIM DAILY   Do you have a living will? YES   Do you have a DNR form?   YES                                If not, do you want to discuss one?   Do you have signed POA/HPOA forms?  YES                      If so, please bring to you appointment   Has 2 adopted children   Social Determinants of Health   Financial Resource Strain: Not on file  Food  Insecurity: No Food Insecurity (01/10/2023)   Hunger Vital Sign    Worried About Running Out of Food in the Last Year: Never true    Ran Out of Food in the Last Year: Never true  Transportation Needs: No Transportation Needs (01/10/2023)   PRAPARE - Administrator, Civil Service (Medical): No    Lack of Transportation (Non-Medical): No  Physical Activity: Not on file  Stress: Not on file  Social Connections: Not on file   Family History  Problem Relation Age of Onset   Colon cancer Mother    Heart disease Father    Esophageal cancer Neg Hx    Stomach cancer Neg Hx    Pancreatic cancer Neg Hx    Scheduled Meds:  apixaban  5 mg Oral BID   fluconazole  100 mg Oral Daily   levothyroxine  25 mcg Oral Q0600   magic mouthwash w/lidocaine  15 mL Oral QID   pantoprazole  40 mg Oral Q0600   Continuous Infusions:  cefTRIAXone (ROCEPHIN)  IV 2 g (01/12/23 1430)   lactated ringers 100 mL/hr at 01/13/23 0251   metronidazole 500 mg (01/13/23 0250)   PRN Meds:.acetaminophen **OR** acetaminophen, morphine injection, oxyCODONE Allergies  Allergen Reactions   Onion Other (See Comments)    Excessive sneezing   Shellfish Allergy Shortness Of Breath   Ambien [Zolpidem] Other (See Comments)    "Allergic," per facility   Amoxicillin Other (See Comments)    "Allergic," per facility   CBC:    Component Value Date/Time   WBC 10.5 01/12/2023 0359   HGB 9.9 (L) 01/12/2023 0359   HCT 30.5 (L) 01/12/2023 0359   PLT 101 (L) 01/12/2023 0359   MCV 93.6 01/12/2023 0359   NEUTROABS 9.6 (H) 01/09/2023 1900   NEUTROABS 6.60 01/07/2023 0000   LYMPHSABS 1.0 01/09/2023 1900   MONOABS 0.9 01/09/2023 1900   EOSABS 0.0 01/09/2023 1900   BASOSABS 0.0 01/09/2023 1900   Comprehensive Metabolic Panel:    Component Value Date/Time   NA 133 (L) 01/12/2023 1237   NA 134 (A) 01/07/2023 0000   K 3.4 (L) 01/12/2023 1237   CL 100 01/12/2023 1237   CO2 25 01/12/2023 1237   BUN 10 01/12/2023 1237    BUN 16 01/07/2023 0000   CREATININE 0.73 01/12/2023 1237   GLUCOSE 116 (H) 01/12/2023 1237   CALCIUM 7.7 (L) 01/12/2023 1237   AST 20 01/09/2023 1900   ALT 12 01/09/2023 1900   ALKPHOS 104 01/09/2023 1900   BILITOT 0.8 01/09/2023 1900   PROT 6.8 01/09/2023 1900   ALBUMIN 2.8 (L) 01/09/2023 1900    Physical Exam: Vital Signs: BP 128/69 (BP Location: Left Arm)   Pulse 90   Temp 98.6 F (37 C) (Oral)   Resp 17   Ht 5\' 7"  (1.702 m)   Wt 72 kg   SpO2 97%   BMI 24.86 kg/m  SpO2: SpO2: 97 % O2 Device: O2 Device: Room Air O2 Flow Rate:   Intake/output summary:  Intake/Output Summary (Last 24 hours) at 01/13/2023 0949 Last data filed at 01/13/2023 0600 Gross per 24 hour  Intake 1567.73 ml  Output --  Net 1567.73 ml   LBM: Last BM Date : 01/12/23 Baseline Weight: Weight: 68 kg Most recent weight: Weight: 72 kg  General: NAD, alert, laying in bed, chronically ill appearing  Eyes: moist mucous membranes Cardiovascular: RRR Respiratory: no increased work of breathing noted, not in respiratory distress Abdomen: not distended Neuro: A&Ox4, following commands easily Psych: appropriately answers all questions          Palliative Performance Scale: 40%              Additional Data Reviewed: Recent Labs    01/11/23 0138 01/11/23 1039 01/11/23 1824 01/12/23 0023 01/12/23 0359 01/12/23 1237  WBC 12.6*  --   --   --  10.5  --  HGB 11.4* 10.4*   < > 10.0* 9.9*  --   PLT 128*  --   --   --  101*  --   NA  --  134*  --   --   --  133*  BUN  --  11  --   --   --  10  CREATININE  --  0.58  --   --   --  0.73   < > = values in this interval not displayed.    Imaging: CT CHEST W CONTRAST CLINICAL DATA:  Evaluate for occult malignancy.  EXAM: CT CHEST WITH CONTRAST  TECHNIQUE: Multidetector CT imaging of the chest was performed during intravenous contrast administration.  Performed in conjunction with CT of the abdomen and pelvis, reported separately.  RADIATION  DOSE REDUCTION: This exam was performed according to the departmental dose-optimization program which includes automated exposure control, adjustment of the mA and/or kV according to patient size and/or use of iterative reconstruction technique.  CONTRAST:  OMNIPAQUE IOHEXOL 350 MG/ML SOLN  COMPARISON:  Remote chest CT 11/10/2007  FINDINGS: Cardiovascular: Aortic atherosclerosis. No aneurysm or evidence of acute aortic abnormality. Calcified and irregular noncalcified atheromatous plaque. There is no central pulmonary embolus on this exam not tailored to pulmonary artery assessment. The heart is normal in size. No pericardial effusion.  Mediastinum/Nodes: Scattered small mediastinal lymph nodes are not enlarged by size criteria. No hilar adenopathy. No enlarged axillary nodes. No esophageal wall thickening. No thyroid nodule.  Lungs/Pleura: Ground-glass nodular opacities in both lungs. Largest ground-glass opacity is within the periphery of the right upper lobe and measures 1.3 x 1.5 x 1.6 cm, series 8, image 65. There is a slightly more confluent area inferiorly but no definite solid component. More faint ground-glass area of nodularity in the left upper lobe measures approximately 11 mm series 8, image 58. lesser areas of ill-defined ground-glass in both lungs. Irregular primarily linear opacity in the anterior left upper lobe measures 8 mm series 8, image 94. There are small bilateral pleural effusions. Minimal pleural fluid tracks into the right major fissure.  Upper Abdomen: Assessed on concurrent abdominopelvic CT, reported separately.  Musculoskeletal: Exaggerated thoracic kyphosis with spondylosis. No focal bone lesion or acute osseous findings. No obvious breast mass by CT.  IMPRESSION: 1. Ground-glass nodular opacities in both lungs, largest in the right upper lobe measuring 1.3 x 1.5 x 1.6 cm. There is a slightly more confluent area inferiorly but no definite  solid component. Findings are nonspecific and may be infectious or inflammatory, however malignancy is not excluded. Per Fleischner Society Guidelines, recommend a non-contrast Chest CT at 3-6 months. Subsequent management based on the most suspicious nodule(s). These guidelines do not apply to immunocompromised patients and patients with cancer. Follow up in patients with significant comorbidities as clinically warranted. For lung cancer screening, adhere to Lung-RADS guidelines. Reference: Radiology. 2017; 284(1):228-43. 2. Small bilateral pleural effusions.  Aortic Atherosclerosis (ICD10-I70.0).  Electronically Signed   By: Narda Rutherford M.D.   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 Assess arterial inflow.  EXAM: CTA ABDOMEN AND PELVIS WITHOUT AND WITH CONTRAST  TECHNIQUE: Multidetector CT imaging of the abdomen and pelvis was performed using the standard protocol during bolus administration of intravenous contrast. Multiplanar reconstructed images and MIPs were obtained and reviewed to evaluate the vascular anatomy.  RADIATION DOSE REDUCTION: This exam was performed according to the departmental  dose-optimization program which includes automated exposure control, adjustment of the mA and/or kV according to patient size and/or use of iterative reconstruction technique.  CONTRAST:  OMNIPAQUE IOHEXOL 350 MG/ML SOLN  COMPARISON:  CT abdomen/pelvis 01/09/2023  FINDINGS: VASCULAR  Aorta: Normal in caliber. No evidence of aneurysm or dissection. Multifocal atherosclerotic plaque.  Celiac: Patent without evidence of aneurysm, dissection, vasculitis or significant stenosis.  SMA: Fibrofatty atherosclerotic plaque results in mild stenosis of the proximal IMA. The degree of stenosis is unlikely to be hemodynamically significant. The vessel remains patent.  Renals:  Multiple renal arteries bilaterally. There are 2 right-sided renal arteries which appear code dominant. On the left, there is a dominant renal artery and a small accessory artery to the lower pole. No significant stenosis, dissection, aneurysm or changes of fibromuscular dysplasia.  IMA: Patent without evidence of aneurysm, dissection, vasculitis or significant stenosis.  Inflow: Patent without evidence of aneurysm, dissection, vasculitis or significant stenosis.  Proximal Outflow: Bilateral common femoral and visualized portions of the superficial and profunda femoral arteries are patent without evidence of aneurysm, dissection, vasculitis or significant stenosis.  Veins: Similar appearance of subocclusive thrombus within the superior mesenteric vein. Slight interval progression of segmental nearly occlusive thrombus within the inferior mesenteric vein. No extension into the splenic or portal vein. Hepatic veins are patent. Renal veins are patent. IVC and iliac veins are patent.  Review of the MIP images confirms the above findings.  NON-VASCULAR  Lower chest: See concurrently obtained but separately dictated CT scan of the chest.  Hepatobiliary: Normal hepatic contour and morphology. No discrete hepatic lesion. Small stones layer within the gallbladder lumen.  Pancreas: Unremarkable. No pancreatic ductal dilatation or surrounding inflammatory changes.  Spleen: Normal in size without focal abnormality.  Adrenals/Urinary Tract: Normal adrenal glands. No hydronephrosis, nephrolithiasis or enhancing renal mass. Multifocal simple renal cysts including a large 8.2 cm simple cyst exophytic from the lower pole of the right kidney. Additionally, there are multiple simple parapelvic renal sinus cysts bilaterally. No imaging follow-up is recommended for any of these simple cystic lesions. Unremarkable ureters and bladder.  Stomach/Bowel: Persistent diffuse submucosal edema  affecting the entire colon but with evidence of hypervascularity and extensive pericolonic stranding. No involvement of the stomach or small bowel. No evidence of obstruction.  Lymphatic: No suspicious lymphadenopathy. Likely reactive lymph nodes present within the ileocolic mesentery.  Reproductive: Status post hysterectomy. No adnexal masses.  Other: No abdominal wall hernia or abnormality. No abdominopelvic ascites.  Musculoskeletal: No acute fracture or aggressive appearing lytic or blastic osseous lesion. Mild multilevel degenerative disc disease.  IMPRESSION: VASCULAR  1. No significant stenosis, dissection or occlusion of the SMA or IMA to provide a source for acute or chronic mesenteric ischemia. 2. There is relatively mild stenosis of the SMA due to fibrofatty atherosclerotic plaque but this does not appear to be hemodynamically significant. 3. Stable appearance of subocclusive thrombus within the superior mesenteric vein. 4. Slight interval progression of nearly occlusive thrombus within the inferior mesenteric vein. 5. No evidence of propagation into the splenic or portal veins. 6. Extensive calcified atherosclerotic plaque including throughout the aorta. Aortic Atherosclerosis (ICD10-I70.0).  NON-VASCULAR  1. Similar appearance of hand colitis. Differential considerations include both infectious and inflammatory etiologies. C difficile and ulcerative colitis are both within the differential. 2. Additional ancillary findings as above without interval change.  Electronically Signed   By: Malachy Moan M.D.   On: 01/11/2023 12:05    I personally reviewed recent imaging.   Palliative  Care Assessment and Plan Summary of Established Goals of Care and Medical Treatment Preferences   Patient is a 89 year old female with a past medical history of hypothyroidism, hypertension, hyperlipidemia, and dementia who was admitted on 01/09/2023 for management of  hematochezia and abdominal pain.  Patient had recently been seen several weeks ago and diagnosed with colitis and discharged on outpatient regimen.  Upon presentation to ER, CT imaging noted pancolitis and SMA and IMA venous thrombosis.  Patient has had continued at both sides of hematochezia.  Vascular and GI consulted for recommendations.  Palliative medicine team consulted to assist with complex medical decision making as per patient's request.  # Complex medical decision making/goals of care  -Extensive conversation with patient and daughter, Amber Bryant, at bedside as described in detail above in HPI.  Reviewed possible pathways for medical care moving forward.  At this time patient opts that she no longer wants to undergo aggressive medical interventions.  Patient wants to transition to comfort focused care at this time.  Patient can voice that she is reaching the end of her life and does not want life prolonging measures by any means.  Patient electing that she does not want to continue any medications that are not focused on symptom management at this time.  Acknowledged this and changed to comfort focused care only.  -Patient has ACP documentation on file naming HCPOA and primary alternate.  Patient's HCPOA was her husband who has now passed away.  Patient's daughter, Amber Bryant, would be her medical decision-maker if she was unable to make medical decisions on her behalf.  -Completed MOST and gold DNR form. Will have scanned into EMR and have placed originals on patient's chart to be available for transfer to LTC facility.   -  Code Status: DNR   # Symptom management    -Pain/Dyspnea, acute in the setting of end-of-life care Patient notes aspects of abdominal pain with dyschezia.  Does not want to take strong opioids if can be avoided.  Prefers to take Tylenol.        -Start Tylenol 650mg  q6hrs prn  -Continue oxycodone 5mg  q4hrs prn -Increase IV morphine to 1-2 mg IV every 1 hour as needed.  Continue to  adjust based on patient's symptom burden.                   -Anxiety/agitation, in the setting of end-of-life care                               -Start Haldol 0.5 mg every 4 hours as needed. Continue to adjust based on patient's symptom burden.                   -Secretions, in the setting of end-of-life care                               -Start glycopyrrolate as needed.  # Psycho-social/Spiritual Support:  - Support System: daughter  # Discharge Planning:   [facility] with hospice. Coordination SNF location vs currently being in independent living.   -TOC consulted to assist   Thank you for allowing the palliative care team to participate in the care Serita Butcher.  Alvester Morin, DO Palliative Care Provider PMT # [phone]  If patient remains symptomatic despite maximum doses, please call PMT at [phone] between 0700 and 1900. Outside of these hours, please call  attending, as PMT does not have night coverage.  This provider spent a total of 95 minutes providing patient's care.  Includes review of EMR, discussing care with other staff members involved in patient's medical care, obtaining relevant history and information from patient and/or patient's family, and personal review of imaging and lab work. Greater than 50% of the time was spent counseling and coordinating care related to the above assessment and plan.    *Please note that this is a verbal dictation therefore any spelling or grammatical errors are due to the "[employer]" system interpretation.

## 2023-01-13 NOTE — Plan of Care (Signed)
°  Problem: Coping: °Goal: Level of anxiety will decrease °Outcome: Progressing °  °

## 2023-01-13 NOTE — Progress Notes (Signed)
Civil engineer, contracting [facility]) Hospital Liaison Note  Received request from Transitions of Care Manager, Cherish, for hospice services at LTC after discharge. Chart and patient information under review by [facility] physician.   Spoke with daughter/Linda to initiate education related to hospice philosophy, services, and team approach to care. [name] verbalized understanding of information given. Per discussion, the plan is for patient to discharge LTC via PTAR once cleared to DC.    Facility to provide needed DME.    Please send signed and completed DNR LTC with patient/family. Please provide prescriptions at discharge as needed to ensure ongoing symptom management.    [facility] information and contact numbers given to family & above information shared with TOC.   Please call with any questions/concerns.    Thank you for the opportunity to participate in this patient's care.   Eugenie Birks, MSW [facility] Liaison  [phone]

## 2023-01-13 NOTE — TOC Progression Note (Signed)
Transition of Care [facility]) - Progression Note    Patient Details  Name: Amber Bryant MRN: [id number] Date of Birth: Feb 13, 1929  Transition [facility]) CM/SW Contact  Carmina Miller, LCSWA Phone Number: 01/13/2023, 12:11 PM  Clinical Narrative:     CSW received request from RN to speak with pt's daughter about hospice providers. CSW spoke with daughter [name] via phone, she states she has no preference but did state the family used [employer] in the past and wouldn't mind using them again. [name] states pt is from [place] and will be returning on the skilled side, had questions about costs, CSW advised that questions would be better answered by their business office, [name] states she will reach out tomorrow.  CSW contacted [place] [facility] lia[facility] [place], requested contact be made with [name]. TOC will continue to follow.   Expected Discharge Plan: Skilled Nursing Facility Barriers to Discharge: Continued Medical Work up  Expected Discharge Plan and Services In-house Referral: Clinical Social Work   Post Acute Care Choice: Skilled Nursing Facility Living arrangements for the past 2 months: Skilled Nursing Facility                 DME Arranged: N/A DME Agency: NA                   Social Determinants of Health (SDOH) Interventions SDOH Screenings   Food Insecurity: No Food Insecurity (01/10/2023)  Housing: Low Risk  (01/10/2023)  Transportation Needs: No Transportation Needs (01/10/2023)  Utilities: Not At Risk (01/10/2023)  Alcohol Screen: Low Risk  (05/21/2018)  Depression (PHQ2-9): Low Risk  (12/25/2022)  Tobacco Use: Low Risk  (01/09/2023)    Readmission Risk Interventions    01/10/2023   12:32 PM  Readmission Risk Prevention Plan  Post Dischage Appt Complete  Medication Screening Complete  Transportation Screening Complete

## 2023-01-13 NOTE — Progress Notes (Signed)
  Daily Progress Note   Patient Name: Amber Bryant       Date: 01/13/2023 DOB: [birth year]  Age: 89 y.o. MRN#: [id number] Attending Physician: Kathlen Mody, MD Primary Care Physician: Mahlon Gammon, MD Admit Date: 01/09/2023 Length of Stay: 4 days  Will place full consult note as soon as able. Discussed care with patient while daughter at bedside. Patient electing to pursue comfort focused care only. Wanting to return to [facility] facility with hospice support. Now DNR, comfort focused cared.   Alvester Morin, DO Palliative Care Provider PMT # [phone]

## 2023-01-13 NOTE — Plan of Care (Signed)

## 2023-01-13 NOTE — Progress Notes (Signed)
[facility]                                                                               Amber Bryant, is a 89 y.o. female, DOB - 1928-07-16, WUJ:[id number] Admit date - 01/09/2023    Outpatient Primary MD for the patient is Mahlon Gammon, MD  LOS - 4  days    Brief summary   89 y.o. female with medical history significant of hypothyroidism, hypertension, hyperlipidemia , dementia, who presented to the emergency department with hematochezia and abdominal pain.  Patient was seen several weeks ago and diagnosed with colitis and discharged on outpatient regimen.  Patient states that since discharge she has had persistent blood in her stool and intermittent abdominal pain.  CT angio of the abdomen and pelvis show  No significant stenosis, dissection or occlusion of the SMA or IMA to provide a source for acute or chronic mesenteric ischemia. 2. There is relatively mild stenosis of the SMA due to fibrofatty atherosclerotic plaque but this does not appear to be hemodynamically significant. 3. Stable appearance of subocclusive thrombus within the superior mesenteric vein. 4. Slight interval progression of nearly occlusive thrombus within the inferior mesenteric vein.  Patient seen and examined at bedside. She is alert and oriented , she is requesting to stop the blood draws, the medications,  be transitioned to hospice care. She also requested to speak to palliative care and  discharge to hospice facility.  Palliative care consulted and she wished to transition to comfort measures.    Assessment & Plan    Assessment and Plan:   Hematochezia  with pancolitis  and  SM and IM venous thrombus on CT abd and pelvis/ CT angio of the abdomen and pelvis.  Differential include ischemic and infectious colitis. Completed outpatient treatment with antibiotics.  Hemoglobin dropped from 12.9 to 10.4 to 9.9.  She was initially started on IV heparin and transitioned to eliquis and  discontinued.  She was empirically started on IV antibiotics, discontinued as she did not want further treatment.  Vascular surgery consulted by EDP, recommended anticoagulation.  GI and hematology on board and have signed off.  Hypercoagulable work up  C diff and GI panel are negative.    Dysphagia and Odynophagia - she is refusing any more testing at this time.  - started on  fluconazole and magic mouth wash with lidocaine, but discontinued.    Leukocytosis:  Reactive vs infection. Resolved.    Hypokalemia;  Replaced,   Acute anemia of blood loss  Drop in hemoglobin from 12.9 to 10.4 to 9.9 today.  She is refusing any more blood draws.   Hypertension:  Optimal BP parameters.   Hypothyroidism:     In view of persistent hematochezia, poor oral intake, deconditioning, not responding to treatment, palliative care consulted for goals of care discussions.  Transition to comfort measures.    Estimated body mass index is 24.86 kg/m as calculated from the following:   Height as of this encounter: 5\' 7"  (1.702 m).   Weight as of this encounter: 72 kg.  Code Status: full code.  DVT Prophylaxis:     Level of Care: Level of care:  Med-Surg Family Communication: none at bedside.   Disposition Plan:     Remains inpatient appropriate:  [facility] with comfort care   Procedures:  CT abd and pelvis 1. 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 2. Pancolitis, presumably infectious/inflammatory and progressed from 12/25/2022.    Consultants:   Gastroenterology  Curb side with vascular  Hematology.   Antimicrobials:   Anti-infectives (From admission, onward)    Start     Dose/Rate Route Frequency Ordered Stop   01/13/23 1200  fluconazole (DIFLUCAN) IVPB 200 mg  Status:  Discontinued        200 mg 100 mL/hr over 60 Minutes Intravenous  Once 01/13/23 1059 01/13/23 1205   01/12/23 1400  cefTRIAXone (ROCEPHIN) 2 g in sodium chloride 0.9 % 100 mL IVPB   Status:  Discontinued        2 g 200 mL/hr over 30 Minutes Intravenous Every 24 hours 01/12/23 0855 01/13/23 1205   01/11/23 1600  fluconazole (DIFLUCAN) tablet 100 mg  Status:  Discontinued        100 mg Oral Daily 01/11/23 1510 01/13/23 1058   01/10/23 1500  ciprofloxacin (CIPRO) IVPB 400 mg  Status:  Discontinued        400 mg 200 mL/hr over 60 Minutes Intravenous Every 12 hours 01/10/23 1213 01/12/23 0854   01/10/23 1500  metroNIDAZOLE (FLAGYL) IVPB 500 mg  Status:  Discontinued        500 mg 100 mL/hr over 60 Minutes Intravenous Every 12 hours 01/10/23 1213 01/13/23 1205   01/10/23 1300  metroNIDAZOLE (FLAGYL) IVPB 500 mg  Status:  Discontinued        500 mg 100 mL/hr over 60 Minutes Intravenous Every 12 hours 01/10/23 1136 01/10/23 1213   01/10/23 1230  ciprofloxacin (CIPRO) IVPB 400 mg  Status:  Discontinued        400 mg 200 mL/hr over 60 Minutes Intravenous Every 12 hours 01/10/23 1135 01/10/23 1213   01/09/23 2100  cefTRIAXone (ROCEPHIN) 2 g in sodium chloride 0.9 % 100 mL IVPB       Placed in "And" Linked Group   2 g 200 mL/hr over 30 Minutes Intravenous  Once 01/09/23 2053 01/09/23 2208   01/09/23 2100  metroNIDAZOLE (FLAGYL) IVPB 500 mg       Placed in "And" Linked Group   500 mg 100 mL/hr over 60 Minutes Intravenous  Once 01/09/23 2053 01/09/23 2324        Medications  Scheduled Meds:  levothyroxine  25 mcg Oral Q0600   Continuous Infusions:   PRN Meds:.antiseptic oral rinse, glycopyrrolate **OR** glycopyrrolate **OR** glycopyrrolate, haloperidol **OR** haloperidol **OR** haloperidol lactate, morphine injection, ondansetron **OR** ondansetron (ZOFRAN) IV, oxyCODONE, polyvinyl alcohol    Subjective:   Amber Bryant was seen and examined today.  Comfortable. Ongoing bloody bowel movements.   Objective:   Vitals:   01/11/23 2205 01/12/23 0632 01/12/23 2122 01/13/23 0439  BP:  119/60 112/65 128/69  Pulse: 82 75 78 90  Resp: 17 16 17 17   Temp:  98 F  (36.7 C) 98 F (36.7 C) 98.6 F (37 C)  TempSrc:   Oral Oral  SpO2: 94% 94% 95% 97%  Weight:      Height:        Intake/Output Summary (Last 24 hours) at 01/13/2023 1332 Last data filed at 01/13/2023 1242 Gross per 24 hour  Intake 2231.94 ml  Output --  Net 2231.94 ml   Filed Weights   01/09/23 1802  01/10/23 0007  Weight: 68 kg 72 kg     Exam General exam: Ill appearing lady, comfortable.  Respiratory system: Clear to auscultation. Respiratory effort normal. Cardiovascular system: S1 & S2 heard, RRR.  Gastrointestinal system: Abdomen is soft, mild gen tenderness.  Central nervous system: Alert and oriented. Extremities: no edema.         Data Reviewed:  I have personally reviewed following labs and imaging studies   CBC Lab Results  Component Value Date   WBC 10.5 01/12/2023   RBC 3.26 (L) 01/12/2023   HGB 9.9 (L) 01/12/2023   HCT 30.5 (L) 01/12/2023   MCV 93.6 01/12/2023   MCH 30.4 01/12/2023   PLT 101 (L) 01/12/2023   MCHC 32.5 01/12/2023   RDW 14.0 01/12/2023   LYMPHSABS 1.0 01/09/2023   MONOABS 0.9 01/09/2023   EOSABS 0.0 01/09/2023   BASOSABS 0.0 01/09/2023     Last metabolic panel Lab Results  Component Value Date   NA 133 (L) 01/12/2023   K 3.4 (L) 01/12/2023   CL 100 01/12/2023   CO2 25 01/12/2023   BUN 10 01/12/2023   CREATININE 0.73 01/12/2023   GLUCOSE 116 (H) 01/12/2023   GFRNONAA >60 01/12/2023   GFRAA >60 08/14/2016   CALCIUM 7.7 (L) 01/12/2023   PHOS 2.5 01/11/2023   PROT 6.8 01/09/2023   ALBUMIN 2.8 (L) 01/09/2023   BILITOT 0.8 01/09/2023   ALKPHOS 104 01/09/2023   AST 20 01/09/2023   ALT 12 01/09/2023   ANIONGAP 8 01/12/2023    CBG (last 3)  No results for input(s): "GLUCAP" in the last 72 hours.    Coagulation Profile: No results for input(s): "INR", "PROTIME" in the last 168 hours.   Radiology Studies: No results found.     Kathlen Mody M.D. [facility] 01/13/2023, 1:32 PM  Available via Epic  secure chat 7am-7pm After 7 pm, please refer to night coverage provider listed on amion.

## 2023-01-14 DIAGNOSIS — K529 Noninfective gastroenteritis and colitis, unspecified: Secondary | ICD-10-CM | POA: Diagnosis not present

## 2023-01-14 DIAGNOSIS — B370 Candidal stomatitis: Secondary | ICD-10-CM | POA: Diagnosis not present

## 2023-01-14 DIAGNOSIS — D62 Acute posthemorrhagic anemia: Secondary | ICD-10-CM | POA: Diagnosis not present

## 2023-01-14 MED ORDER — OXYCODONE HCL 5 MG PO TABS: 5.0000 mg | ORAL_TABLET | ORAL | 0 refills | Status: AC | PRN

## 2023-01-14 MED ORDER — POLYVINYL ALCOHOL 1.4 % OP SOLN: 1.0000 [drp] | Freq: Four times a day (QID) | OPHTHALMIC | 0 refills | Status: DC | PRN

## 2023-01-14 NOTE — TOC Transition Note (Signed)
Transition of Care [facility]) - CM/SW Discharge Note   Patient Details  Name: Amber Bryant MRN: [id number] Date of Birth: 23-Nov-1928  [facility]) CM/SW Contact:  Amada Jupiter, LCSW Phone Number: 01/14/2023, 2:17 PM   Clinical Narrative:     Pt medically cleared for dc today to SNF at [facility] and facility ready to accept.  Pt and daughter request that Civil engineer, contracting follow pt with palliative team at [facility] and referral place yesterday.  PTAR called at 1:30pm.  RN to call report to [phone].  No further TOC needs.  Final next level of care: Skilled Nursing Facility Barriers to Discharge: Barriers Resolved   Patient Goals and CMS Choice      Discharge Placement                Patient chooses bed at: [facility] Patient to be transferred to facility by: PTAR Name of family member notified: daughter Patient and family notified of of transfer: 01/14/23  Discharge Plan and Services Additional resources added to the After Visit Summary for   In-house Referral: Clinical Social Work   Post Acute Care Choice: Skilled Nursing Facility          DME Arranged: N/A DME Agency: NA                  Social Determinants of Health (SDOH) Interventions SDOH Screenings   Food Insecurity: No Food Insecurity (01/10/2023)  Housing: Low Risk  (01/10/2023)  Transportation Needs: No Transportation Needs (01/10/2023)  Utilities: Not At Risk (01/10/2023)  Alcohol Screen: Low Risk  (05/21/2018)  Depression (PHQ2-9): Low Risk  (12/25/2022)  Tobacco Use: Low Risk  (01/09/2023)     Readmission Risk Interventions    01/10/2023   12:32 PM  Readmission Risk Prevention Plan  Post Dischage Appt Complete  Medication Screening Complete  Transportation Screening Complete

## 2023-01-14 NOTE — Discharge Summary (Signed)
Physician Discharge Summary   Patient: Amber Bryant MRN: [id number] DOB: October 11, 1928  Admit date:     01/09/2023  Discharge date: 01/14/23  Discharge Physician: Kathlen Mody   PCP: Mahlon Gammon, MD   Recommendations at discharge:  Please follow up with Hospice MD as needed.    Discharge Diagnoses: Principal Problem:   Acute blood loss anemia Active Problems:   Colitis   Superior mesenteric vein thrombosis (HCC)   Abnormal CT scan, colon   Palliative care encounter   Oral candidiasis   Concern about end of life   Counseling and coordination of care   Need for emotional support   DNR (do not resuscitate)   Pain    Hospital Course: 89 y.o. female with medical history significant of hypothyroidism, hypertension, hyperlipidemia , dementia, who presented to the emergency department with hematochezia and abdominal pain.  Patient was seen several weeks ago and diagnosed with colitis and discharged on outpatient regimen.  Patient states that since discharge she has had persistent blood in her stool and intermittent abdominal pain.  CT angio of the abdomen and pelvis show  No significant stenosis, dissection or occlusion of the SMA or IMA to provide a source for acute or chronic mesenteric ischemia. 2. There is relatively mild stenosis of the SMA due to fibrofatty atherosclerotic plaque but this does not appear to be hemodynamically significant. 3. Stable appearance of subocclusive thrombus within the superior mesenteric vein. 4. Slight interval progression of nearly occlusive thrombus within the inferior mesenteric vein.   Patient seen and examined at bedside. She is alert and oriented , she is requesting to stop the blood draws, the medications,  be transitioned to hospice care. She also requested to speak to palliative care and  discharge to hospice facility.  Palliative care consulted and she wished to transition to comfort measures.     Assessment and Plan:  Hematochezia   with pancolitis  and  SM and IM venous thrombus on CT abd and pelvis/ CT angio of the abdomen and pelvis.  Differential include ischemic and infectious colitis. Completed outpatient treatment with antibiotics.  Hemoglobin dropped from 12.9 to 10.4 to 9.9.  She was initially started on IV heparin and transitioned to eliquis and discontinued.  She was empirically started on IV antibiotics, discontinued as she did not want further treatment.  Vascular surgery consulted by EDP, recommended anticoagulation.  GI and hematology on board and have signed off.  Hypercoagulable work up  C diff and GI panel are negative.      Dysphagia and Odynophagia - she is refusing any more testing at this time.  - started on  fluconazole and magic mouth wash with lidocaine, but discontinued.      Leukocytosis:  Reactive vs infection. Resolved.      Hypokalemia;  Replaced,     Acute anemia of blood loss  Drop in hemoglobin from 12.9 to 10.4 to 9.9 today.  She is refusing any more blood draws.    Hypertension:  Optimal BP parameters.    Hypothyroidism:        In view of persistent hematochezia, poor oral intake, deconditioning, not responding to treatment, palliative care consulted for goals of care discussions.  Transition to comfort measures.      Estimated body mass index is 24.86 kg/m as calculated from the following:   Height as of this encounter: 5\' 7"  (1.702 m).   Weight as of this encounter: 72 kg.     Consultants: palliative care GI  Procedures performed: CT angio of the abdomen and pelvis.   Disposition: Home Diet recommendation:  Discharge Diet Orders (From admission, onward)     Start     Ordered   01/14/23 0000  Diet - low sodium heart healthy        01/14/23 1137           Regular diet DISCHARGE MEDICATION: Allergies as of 01/14/2023       Reactions   Onion Other (See Comments)   Excessive sneezing   Shellfish Allergy Shortness Of Breath   Ambien [zolpidem]  Other (See Comments)   "Allergic," per facility   Amoxicillin Other (See Comments)   "Allergic," per facility        Medication List     STOP taking these medications    azithromycin 500 MG tablet Commonly known as: ZITHROMAX   Cholecalciferol 50 MCG (2000 UT) Caps   hydrochlorothiazide 25 MG tablet Commonly known as: HYDRODIURIL   lovastatin 20 MG tablet Commonly known as: MEVACOR   Lutein-Zeaxanthin 25-5 MG Caps   omeprazole 20 MG tablet Commonly known as: PRILOSEC OTC   potassium chloride 10 MEQ tablet Commonly known as: KLOR-CON   vitamin C 250 MG tablet Commonly known as: ASCORBIC ACID   Vitamin D3 50 MCG (2000 UT) Tabs       TAKE these medications    acetaminophen 325 MG tablet Commonly known as: TYLENOL Take 650 mg by mouth 3 (three) times daily as needed (for pain).   levothyroxine 25 MCG tablet Commonly known as: SYNTHROID Take 1 tablet (25 mcg total) by mouth every morning. What changed: when to take this   ondansetron 4 MG disintegrating tablet Commonly known as: ZOFRAN-ODT Take 4 mg by mouth every 6 (six) hours as needed for nausea or vomiting.   oxyCODONE 5 MG immediate release tablet Commonly known as: Oxy IR/ROXICODONE Take 1 tablet (5 mg total) by mouth every 4 (four) hours as needed for up to 3 days for moderate pain.   polyethylene glycol 17 g packet Commonly known as: MIRALAX / GLYCOLAX Take 17 g by mouth daily as needed for mild constipation (to be mixed into 6-8 ounces of fluid).   polyvinyl alcohol 1.4 % ophthalmic solution Commonly known as: LIQUIFILM TEARS Place 1 drop into both eyes 4 (four) times daily as needed for dry eyes.        Discharge Exam: Filed Weights   01/09/23 1802 01/10/23 0007  Weight: 68 kg 72 kg   General exam: ill appearing, not in distress.  Respiratory system: air entry fair.  Cardiovascular system: S1 & S2 heard, RRR.  Gastrointestinal system: Abdomen is soft,  Central nervous system:  oriented to person.  Extremities: no edema. Skin: No rashes,   Condition at discharge: fair  The results of significant diagnostics from this hospitalization (including imaging, microbiology, ancillary and laboratory) are listed below for reference.   Imaging Studies: CT CHEST W CONTRAST  Result Date: 01/11/2023 CLINICAL DATA:  Evaluate for occult malignancy. EXAM: CT CHEST WITH CONTRAST TECHNIQUE: Multidetector CT imaging of the chest was performed during intravenous contrast administration. Performed in conjunction with CT of the abdomen and pelvis, reported separately. RADIATION DOSE REDUCTION: This exam was performed according to the departmental dose-optimization program which includes automated exposure control, adjustment of the mA and/or kV according to patient size and/or use of iterative reconstruction technique. CONTRAST:  OMNIPAQUE IOHEXOL 350 MG/ML SOLN COMPARISON:  Remote chest CT 11/10/2007 FINDINGS: Cardiovascular: Aortic atherosclerosis. No aneurysm or  evidence of acute aortic abnormality. Calcified and irregular noncalcified atheromatous plaque. There is no central pulmonary embolus on this exam not tailored to pulmonary artery assessment. The heart is normal in size. No pericardial effusion. Mediastinum/Nodes: Scattered small mediastinal lymph nodes are not enlarged by size criteria. No hilar adenopathy. No enlarged axillary nodes. No esophageal wall thickening. No thyroid nodule. Lungs/Pleura: Ground-glass nodular opacities in both lungs. Largest ground-glass opacity is within the periphery of the right upper lobe and measures 1.3 x 1.5 x 1.6 cm, series 8, image 65. There is a slightly more confluent area inferiorly but no definite solid component. More faint ground-glass area of nodularity in the left upper lobe measures approximately 11 mm series 8, image 58. lesser areas of ill-defined ground-glass in both lungs. Irregular primarily linear opacity in the anterior left upper  lobe measures 8 mm series 8, image 94. There are small bilateral pleural effusions. Minimal pleural fluid tracks into the right major fissure. Upper Abdomen: Assessed on concurrent abdominopelvic CT, reported separately. Musculoskeletal: Exaggerated thoracic kyphosis with spondylosis. No focal bone lesion or acute osseous findings. No obvious breast mass by CT. IMPRESSION: 1. Ground-glass nodular opacities in both lungs, largest in the right upper lobe measuring 1.3 x 1.5 x 1.6 cm. There is a slightly more confluent area inferiorly but no definite solid component. Findings are nonspecific and may be infectious or inflammatory, however malignancy is not excluded. Per Fleischner Society Guidelines, recommend a non-contrast Chest CT at 3-6 months. Subsequent management based on the most suspicious nodule(s). These guidelines do not apply to immunocompromised patients and patients with cancer. Follow up in patients with significant comorbidities as clinically warranted. For lung cancer screening, adhere to Lung-RADS guidelines. Reference: Radiology. 2017; 284(1):228-43. 2. Small bilateral pleural effusions. Aortic Atherosclerosis (ICD10-I70.0). Electronically Signed   By: Narda Rutherford M.D.   On: 01/11/2023 14:23   CT ANGIO ABDOMEN PELVIS  W & WO CONTRAST  Result Date: 01/11/2023 CLINICAL DATA:  Suspected ischemic colitis-known SMV and IMV thrombus. Assess arterial inflow. EXAM: CTA ABDOMEN AND PELVIS WITHOUT AND WITH CONTRAST TECHNIQUE: Multidetector CT imaging of the abdomen and pelvis was performed using the standard protocol during bolus administration of intravenous contrast. Multiplanar reconstructed images and MIPs were obtained and reviewed to evaluate the vascular anatomy. RADIATION DOSE REDUCTION: This exam was performed according to the departmental dose-optimization program which includes automated exposure control, adjustment of the mA and/or kV according to patient size and/or use of iterative  reconstruction technique. CONTRAST:  OMNIPAQUE IOHEXOL 350 MG/ML SOLN COMPARISON:  CT abdomen/pelvis 01/09/2023 FINDINGS: VASCULAR Aorta: Normal in caliber. No evidence of aneurysm or dissection. Multifocal atherosclerotic plaque. Celiac: Patent without evidence of aneurysm, dissection, vasculitis or significant stenosis. SMA: Fibrofatty atherosclerotic plaque results in mild stenosis of the proximal IMA. The degree of stenosis is unlikely to be hemodynamically significant. The vessel remains patent. Renals: Multiple renal arteries bilaterally. There are 2 right-sided renal arteries which appear code dominant. On the left, there is a dominant renal artery and a small accessory artery to the lower pole. No significant stenosis, dissection, aneurysm or changes of fibromuscular dysplasia. IMA: Patent without evidence of aneurysm, dissection, vasculitis or significant stenosis. Inflow: Patent without evidence of aneurysm, dissection, vasculitis or significant stenosis. Proximal Outflow: Bilateral common femoral and visualized portions of the superficial and profunda femoral arteries are patent without evidence of aneurysm, dissection, vasculitis or significant stenosis. Veins: Similar appearance of subocclusive thrombus within the superior mesenteric vein. Slight interval progression of segmental nearly occlusive thrombus  within the inferior mesenteric vein. No extension into the splenic or portal vein. Hepatic veins are patent. Renal veins are patent. IVC and iliac veins are patent. Review of the MIP images confirms the above findings. NON-VASCULAR Lower chest: See concurrently obtained but separately dictated CT scan of the chest. Hepatobiliary: Normal hepatic contour and morphology. No discrete hepatic lesion. Small stones layer within the gallbladder lumen. Pancreas: Unremarkable. No pancreatic ductal dilatation or surrounding inflammatory changes. Spleen: Normal in size without focal abnormality.  Adrenals/Urinary Tract: Normal adrenal glands. No hydronephrosis, nephrolithiasis or enhancing renal mass. Multifocal simple renal cysts including a large 8.2 cm simple cyst exophytic from the lower pole of the right kidney. Additionally, there are multiple simple parapelvic renal sinus cysts bilaterally. No imaging follow-up is recommended for any of these simple cystic lesions. Unremarkable ureters and bladder. Stomach/Bowel: Persistent diffuse submucosal edema affecting the entire colon but with evidence of hypervascularity and extensive pericolonic stranding. No involvement of the stomach or small bowel. No evidence of obstruction. Lymphatic: No suspicious lymphadenopathy. Likely reactive lymph nodes present within the ileocolic mesentery. Reproductive: Status post hysterectomy. No adnexal masses. Other: No abdominal wall hernia or abnormality. No abdominopelvic ascites. Musculoskeletal: No acute fracture or aggressive appearing lytic or blastic osseous lesion. Mild multilevel degenerative disc disease. IMPRESSION: VASCULAR 1. No significant stenosis, dissection or occlusion of the SMA or IMA to provide a source for acute or chronic mesenteric ischemia. 2. There is relatively mild stenosis of the SMA due to fibrofatty atherosclerotic plaque but this does not appear to be hemodynamically significant. 3. Stable appearance of subocclusive thrombus within the superior mesenteric vein. 4. Slight interval progression of nearly occlusive thrombus within the inferior mesenteric vein. 5. No evidence of propagation into the splenic or portal veins. 6. Extensive calcified atherosclerotic plaque including throughout the aorta. Aortic Atherosclerosis (ICD10-I70.0). NON-VASCULAR 1. Similar appearance of hand colitis. Differential considerations include both infectious and inflammatory etiologies. C difficile and ulcerative colitis are both within the differential. 2. Additional ancillary findings as above without interval  change. Electronically Signed   By: Malachy Moan M.D.   On: 01/11/2023 12:05   CT ABDOMEN PELVIS W CONTRAST  Result Date: 01/09/2023 CLINICAL DATA:  Weakness, abdominal pain, loose/bloody stools EXAM: CT ABDOMEN AND PELVIS WITH CONTRAST TECHNIQUE: Multidetector CT imaging of the abdomen and pelvis was performed using the standard protocol following bolus administration of intravenous contrast. RADIATION DOSE REDUCTION: This exam was performed according to the departmental dose-optimization program which includes automated exposure control, adjustment of the mA and/or kV according to patient size and/or use of iterative reconstruction technique. CONTRAST:  OMNIPAQUE IOHEXOL 300 MG/ML  SOLN COMPARISON:  CT 12/25/2022 FINDINGS: Lower chest: No acute abnormality. Hepatobiliary: Cholelithiasis. No focal hepatic lesion. No evidence of cholecystitis. No biliary dilation. Pancreas: Unremarkable. Spleen: Unremarkable. Adrenals/Urinary Tract: Stable adrenal glands. Low-attenuation lesions in the kidneys are statistically likely to represent cysts. No follow-up is required. No urinary calculi or hydronephrosis. Unremarkable bladder. Stomach/Bowel: Diffuse colonic wall thickening and mucosal hyperenhancement with adjacent pericolonic inflammatory stranding. This has progressed from 12/25/2022. No evidence of obstruction. Stomach is within normal limits. Vascular/Lymphatic: New thrombus within the superior mesenteric vein causes severe narrowing and near occlusion (series 2/image 29). There is additional thrombus within the inferior mesenteric vein causing severe narrowing (series 2/image 26. Splenic and portal veins are patent. No lymphadenopathy by size. Reproductive: No acute abnormality. Other: Trace free fluid in the pelvis and right paracolic gutter. No free intraperitoneal air. Musculoskeletal: No acute fracture.  Left THA. IMPRESSION: 1. 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 2.  Pancolitis, presumably infectious/inflammatory and progressed from 12/25/2022. These results were called by telephone at the time of interpretation on 01/09/2023 at 8:47 pm to provider [facility] , who verbally acknowledged these results. Electronically Signed   By: Minerva Fester M.D.   On: 01/09/2023 20:48   CT ABDOMEN PELVIS W CONTRAST  Result Date: 12/25/2022 CLINICAL DATA:  Abdominal pain and rectal bleeding. EXAM: CT ABDOMEN AND PELVIS WITH CONTRAST TECHNIQUE: Multidetector CT imaging of the abdomen and pelvis was performed using the standard protocol following bolus administration of intravenous contrast. RADIATION DOSE REDUCTION: This exam was performed according to the departmental dose-optimization program which includes automated exposure control, adjustment of the mA and/or kV according to patient size and/or use of iterative reconstruction technique. CONTRAST:  80mL OMNIPAQUE IOHEXOL 300 MG/ML  SOLN COMPARISON:  07/29/2020 FINDINGS: Lower chest: No basilar airspace disease or pleural effusion. Hepatobiliary: Tiny cyst in the subcapsular right lobe of the liver. No suspicious liver lesion. Layering gallstones within physiologically distended gallbladder. No biliary dilatation. Pancreas: 7 mm cyst in the pancreatic tail, series 3, image 26. No ductal dilatation or inflammation. Spleen: Normal in size without focal abnormality. Adrenals/Urinary Tract: Normal adrenal glands. No hydronephrosis. There are bilateral parapelvic cysts. 9 cm cortical cyst arises from the upper pole of the right kidney. No specific imaging follow-up is recommended. No evidence of solid lesion. The urinary bladder is partially distended, normal for degree of distension. Stomach/Bowel: There is mild wall thickening with pericolonic edema throughout the sigmoid colon, for example series 3 images 57 and 58. Minimal wall thickening is also seen involving the distal descending colon, for example series 3, image 39. multiple  colonic diverticula, although length of colonic involvement favors colitis over diverticulitis. There is moderate colonic stool burden proximally. Normal appendix potentially visualized. The stomach is nondistended. There is no small bowel inflammation. Vascular/Lymphatic: Aortic atherosclerosis without aneurysm. 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 Distal branches are patent. The portal vein is patent. Increased number of lymph nodes adjacent to the inferior mesenteric vessels as well as adjacent to inflamed sigmoid colon, likely reactive. Reproductive: Status post hysterectomy. No adnexal masses. Other: No free air or perforation. No significant ascites. No focal fluid collection. No abdominal wall hernia. Musculoskeletal: Left hip arthroplasty. Scoliosis and degenerative change in the spine. IMPRESSION: 1. Mild wall thickening with pericolonic edema throughout the sigmoid colon and to a lesser extent the distal descending colon, suspicious for colitis. There are multiple colonic diverticula, although length of colonic involvement favors colitis over diverticulitis. 2. Increased number of lymph nodes adjacent to the inferior mesenteric vessels as well as adjacent to inflamed sigmoid colon, likely reactive. 3. Cholelithiasis without CT findings of acute cholecystitis. 4. A 7 mm cyst in the pancreatic tail. Per consensus guidelines call in a patient of this age for a cyst of this size, the decision to pursue imaging follow-up should be anchored to the patient's overall health and preferences, such workup is only advised if the patient is a surgical candidate. Follow-up imaging in 2 years can be considered. Aortic Atherosclerosis (ICD10-I70.0). Electronically Signed   By: Narda Rutherford M.D.   On: 12/25/2022 21:14    Microbiology: Results for orders placed or performed during the hospital encounter of 01/09/23  C Difficile Quick Screen w PCR  reflex     Status: None   Collection  Time: 01/10/23  2:20 PM   Specimen: STOOL  Result Value Ref Range Status   C Diff antigen NEGATIVE NEGATIVE Final   C Diff toxin NEGATIVE NEGATIVE Final   C Diff interpretation No C. difficile detected.  Final    Comment: Performed at [facility], [address], [place], [place] [address]  Gastrointestinal Panel by PCR , Stool     Status: None   Collection Time: 01/10/23  2:21 PM   Specimen: STOOL  Result Value Ref Range Status   Campylobacter species NOT DETECTED NOT DETECTED Final   Plesimonas shigelloides NOT DETECTED NOT DETECTED Final   Salmonella species NOT DETECTED NOT DETECTED Final   Yersinia enterocolitica NOT DETECTED NOT DETECTED Final   Vibrio species NOT DETECTED NOT DETECTED Final   Vibrio cholerae NOT DETECTED NOT DETECTED Final   Enteroaggregative E coli (EAEC) NOT DETECTED NOT DETECTED Final   Enteropathogenic E coli (EPEC) NOT DETECTED NOT DETECTED Final   Enterotoxigenic E coli (ETEC) NOT DETECTED NOT DETECTED Final   Shiga like toxin producing E coli (STEC) NOT DETECTED NOT DETECTED Final   Shigella/Enteroinvasive E coli (EIEC) NOT DETECTED NOT DETECTED Final   Cryptosporidium NOT DETECTED NOT DETECTED Final   Cyclospora cayetanensis NOT DETECTED NOT DETECTED Final   Entamoeba histolytica NOT DETECTED NOT DETECTED Final   Giardia lamblia NOT DETECTED NOT DETECTED Final   Adenovirus F40/41 NOT DETECTED NOT DETECTED Final   Astrovirus NOT DETECTED NOT DETECTED Final   Norovirus GI/GII NOT DETECTED NOT DETECTED Final   Rotavirus A NOT DETECTED NOT DETECTED Final   Sapovirus (I, II, IV, and V) NOT DETECTED NOT DETECTED Final    Comment: Performed at [facility], [address] [place], [place] [address]    Labs: CBC: Recent Labs  Lab 01/09/23 1900 01/10/23 0329 01/10/23 1703 01/11/23 0138 01/11/23 1039 01/11/23 1824 01/12/23 0023 01/12/23 0359  WBC 11.9* 12.8*  --  12.6*  --   --    --  10.5  NEUTROABS 9.6*  --   --   --   --   --   --   --   HGB 12.9 11.6*   < > 11.4* 10.4* 10.6* 10.0* 9.9*  HCT 40.3 36.4   < > 35.9* 32.5* 33.6* 31.4* 30.5*  MCV 94.8 94.3  --  95.7  --   --   --  93.6  PLT 196 142*  --  128*  --   --   --  101*   < > = values in this interval not displayed.   Basic Metabolic Panel: Recent Labs  Lab 01/09/23 1900 01/09/23 1955 01/10/23 0329 01/11/23 1039 01/11/23 1824 01/12/23 1237  NA 134*  --  134* 134*  --  133*  K 3.1*  --  3.1* 2.9*  --  3.4*  CL 96*  --  98 100  --  100  CO2 25  --  25 26  --  25  GLUCOSE 125*  --  135* 116*  --  116*  BUN 13  --  10 11  --  10  CREATININE 0.66  --  0.75 0.58  --  0.73  CALCIUM 8.6*  --  8.2* 7.9*  --  7.7*  MG  --  1.9  --   --  1.7 1.6*  PHOS  --   --   --   --  2.5  --    Liver Function Tests: Recent Labs  Lab  01/09/23 1900  AST 20  ALT 12  ALKPHOS 104  BILITOT 0.8  PROT 6.8  ALBUMIN 2.8*   CBG: No results for input(s): "GLUCAP" in the last 168 hours.  Discharge time spent: 38 minutes.   Signed: Kathlen Mody, MD [facility] 01/14/2023

## 2023-01-14 NOTE — [place] FL2 (Signed)
[place] MEDICAID FL2 LEVEL OF CARE FORM     IDENTIFICATION  Patient Name: Amber Bryant Birthdate: 09/06/1928 Sex: female Admission Date (Current Location): 01/09/2023  [facility] and [place] Number:  Producer, television/film/video and Address:  [facility],  [address] [place], [place] [address]      Provider Number: [id number]  Attending Physician Name and Address:  Kathlen Mody, MD  Relative Name and Phone Number:  daughter, Jillyn Ledger @ [phone]    Current Level of Care: Hospital Recommended Level of Care: Skilled Nursing Facility Prior Approval Number:    Date Approved/Denied:   PASRR Number:    Discharge Plan: SNF    Current Diagnoses: Patient Active Problem List   Diagnosis Date Noted   Palliative care encounter 01/13/2023   Oral candidiasis 01/13/2023   Concern about end of life 01/13/2023   Counseling and coordination of care 01/13/2023   Need for emotional support 01/13/2023   DNR (do not resuscitate) 01/13/2023   Pain 01/13/2023   Colitis 01/11/2023   Superior mesenteric vein thrombosis (HCC) 01/11/2023   Abnormal CT scan, colon 01/11/2023   Acute blood loss anemia 01/09/2023   Diverticulitis 08/19/2020   Chronic venous insufficiency 11/13/2017   Overweight (BMI 25.0-29.9) 11/13/2017   Senile osteoporosis 08/28/2016   Constipation 08/27/2016   Arthritis of right hip 08/20/2016   Traumatic arthritis of hip, left 08/16/2016   Hypokalemia 04/21/2016   Vitamin D deficiency 04/21/2016   Hypothyroidism    Hyperlipidemia    Primary hypertension    PULMONARY NODULE 11/25/2007    Orientation RESPIRATION BLADDER Height & Weight     Self, Time, Situation, Place  Normal Continent Weight: 158 lb 11.7 oz (72 kg) Height:  5\' 7"  (170.2 cm)  BEHAVIORAL SYMPTOMS/MOOD NEUROLOGICAL BOWEL NUTRITION STATUS      Continent    AMBULATORY STATUS COMMUNICATION OF NEEDS Skin   Total Care Verbally Normal                       Personal Care  Assistance Level of Assistance  Bathing, Feeding, Dressing Bathing Assistance: Limited assistance Feeding assistance: Limited assistance       Functional Limitations Info  Hearing, Speech, Sight Sight Info: Adequate Hearing Info: Adequate Speech Info: Adequate    SPECIAL CARE FACTORS FREQUENCY  PT (By licensed PT), OT (By licensed OT)     PT Frequency: 3-5x/wk OT Frequency: 3-5x/wk            Contractures Contractures Info: Not present    Additional Factors Info  Code Status, Allergies Code Status Info: DNR Allergies Info: Onion, Shellfish Allergy, Ambien (Zolpidem), Amoxicillin           Current Medications (01/14/2023):  This is the current hospital active medication list Current Facility-Administered Medications  Medication Dose Route Frequency Provider Last Rate Last Admin   acetaminophen (TYLENOL) tablet 650 mg  650 mg Oral Q6H PRN Mims, Lauren W, DO       antiseptic oral rinse (BIOTENE) solution 15 mL  15 mL Topical PRN Mims, Lauren W, DO       glycopyrrolate (ROBINUL) tablet 1 mg  1 mg Oral Q4H PRN Mims, Lauren W, DO       Or   glycopyrrolate (ROBINUL) injection 0.2 mg  0.2 mg Subcutaneous Q4H PRN Mims, Lauren W, DO       Or   glycopyrrolate (ROBINUL) injection 0.2 mg  0.2 mg Intravenous Q4H PRN Alena Bills, DO  haloperidol (HALDOL) tablet 0.5 mg  0.5 mg Oral Q4H PRN Mims, Lauren W, DO       Or   haloperidol (HALDOL) 2 MG/ML solution 0.5 mg  0.5 mg Sublingual Q4H PRN Mims, Lauren W, DO       Or   haloperidol lactate (HALDOL) injection 0.5 mg  0.5 mg Intravenous Q4H PRN Mims, Lauren W, DO       levothyroxine (SYNTHROID) tablet 25 mcg  25 mcg Oral Q0600 Alan Mulder, MD   25 mcg at 01/14/23 0410   morphine (PF) 2 MG/ML injection 1-2 mg  1-2 mg Intravenous Q1H PRN Mims, Lauren W, DO       ondansetron (ZOFRAN-ODT) disintegrating tablet 4 mg  4 mg Oral Q6H PRN Mims, Lauren W, DO       Or   ondansetron (ZOFRAN) injection 4 mg  4 mg Intravenous Q6H PRN  Mims, Lauren W, DO       oxyCODONE (Oxy IR/ROXICODONE) immediate release tablet 5 mg  5 mg Oral Q4H PRN Dorrell, Robert, MD       polyvinyl alcohol (LIQUIFILM TEARS) 1.4 % ophthalmic solution 1 drop  1 drop Both Eyes QID PRN Alena Bills, DO         Discharge Medications: Please see discharge summary for a list of discharge medications.  Relevant Imaging Results:  Relevant Lab Results:   Additional Information SSN: [id number]  Amada Jupiter, LCSW

## 2023-01-15 ENCOUNTER — Encounter: Payer: Self-pay | Admitting: Internal Medicine

## 2023-01-15 ENCOUNTER — Non-Acute Institutional Stay (SKILLED_NURSING_FACILITY): Payer: Medicare Other | Admitting: Internal Medicine

## 2023-01-15 DIAGNOSIS — K625 Hemorrhage of anus and rectum: Secondary | ICD-10-CM

## 2023-01-15 DIAGNOSIS — E039 Hypothyroidism, unspecified: Secondary | ICD-10-CM | POA: Diagnosis not present

## 2023-01-15 DIAGNOSIS — G311 Senile degeneration of brain, not elsewhere classified: Secondary | ICD-10-CM | POA: Diagnosis not present

## 2023-01-15 DIAGNOSIS — I10 Essential (primary) hypertension: Secondary | ICD-10-CM | POA: Diagnosis not present

## 2023-01-15 DIAGNOSIS — E785 Hyperlipidemia, unspecified: Secondary | ICD-10-CM | POA: Diagnosis not present

## 2023-01-15 DIAGNOSIS — K529 Noninfective gastroenteritis and colitis, unspecified: Secondary | ICD-10-CM | POA: Diagnosis not present

## 2023-01-15 DIAGNOSIS — F0280 Dementia in other diseases classified elsewhere without behavioral disturbance: Secondary | ICD-10-CM | POA: Diagnosis not present

## 2023-01-15 DIAGNOSIS — D649 Anemia, unspecified: Secondary | ICD-10-CM | POA: Diagnosis not present

## 2023-01-15 DIAGNOSIS — G3184 Mild cognitive impairment, so stated: Secondary | ICD-10-CM | POA: Diagnosis not present

## 2023-01-15 DIAGNOSIS — M810 Age-related osteoporosis without current pathological fracture: Secondary | ICD-10-CM | POA: Diagnosis not present

## 2023-01-15 DIAGNOSIS — R630 Anorexia: Secondary | ICD-10-CM | POA: Diagnosis not present

## 2023-01-15 DIAGNOSIS — R1310 Dysphagia, unspecified: Secondary | ICD-10-CM | POA: Diagnosis not present

## 2023-01-15 DIAGNOSIS — R531 Weakness: Secondary | ICD-10-CM | POA: Diagnosis not present

## 2023-01-15 DIAGNOSIS — K55069 Acute infarction of intestine, part and extent unspecified: Secondary | ICD-10-CM | POA: Diagnosis not present

## 2023-01-15 NOTE — Progress Notes (Unsigned)
Provider:   Location:  [facility] Room Number: 155A Place of Service:  SNF (31)  PCP: Mahlon Gammon, MD Patient Care Team: Mahlon Gammon, MD as PCP - General (Internal Medicine) Elise Benne, MD as Consulting Physician (Ophthalmology) Jones Broom, MD as Consulting Physician (Orthopedic Surgery)  Extended Emergency Contact Information Primary Emergency Contact: Daisy Floro of [place] Home Phone: [phone] Relation: Daughter  Code Status: DNR Hospice Goals of Care: Advanced Directive information    01/15/2023    2:20 PM  Advanced Directives  Does Patient Have a Medical Advance Directive? Yes  Type of Advance Directive Healthcare Power of Attorney  Does patient want to make changes to medical advance directive? No - Patient declined  Copy of Healthcare Power of Attorney in Chart? Yes - validated most recent copy scanned in chart (See row information)  Would patient like information on creating a medical advance directive? No - Patient declined      Chief Complaint  Patient presents with  . New Admit To SNF    Patient is being seen for a new admission to SNF  . Immunizations    Patient is due for Tdap and Pneumonia vaccine    HPI: Patient is a 89 y.o. 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  Pancolitis, presumably infectious/inflammatory and progressed from  12/25/2022. Seen By hematology Occult cancer ruled out with negative CT of lung Started on Heparin with plan for DOCA  Past Medical History:  Diagnosis Date  . Arthritis   . Closed left femoral fracture (HCC) 2012   s/p ORIF, Dr. Ave Filter  . Closed left radial fracture 2012   s/p reduction  . Complication of anesthesia 08/14/2016   not able to think clear for a long time afterwards  . Essential hypertension   . Hyperlipidemia   . Hypothyroidism    Past Surgical History:  Procedure Laterality Date  . ABDOMINAL HYSTERECTOMY    . CATARACT EXTRACTION W/ INTRAOCULAR LENS  IMPLANT, BILATERAL Bilateral 2014  . COLONOSCOPY    . ORIF FEMUR FRACTURE Left 04/08/2011   Dr. Ave Filter  . TOTAL HIP ARTHROPLASTY Left 08/20/2016   Procedure: TOTAL HIP ARTHROPLASTY POSTERIOR REMOVE TROCH NAIL;  Surgeon: Gean Birchwood, MD;  Location: [facility] OR;  Service: Orthopedics;  Laterality: Left;    reports that she has never smoked. She has never used smokeless tobacco. She reports that she does not drink alcohol and does not use drugs. Social History   Socioeconomic History  . Marital status: Widowed    Spouse name: Not on file  . Number of children: Not on file  . Years of education: Not on file  . Highest education level: Not on file  Occupational History  . Not on file  Tobacco Use  . Smoking status: Never  . Smokeless tobacco: Never  Vaping Use  . Vaping status: Never Used  Substance and Sexual Activity  .  Alcohol use: No  . Drug use: No  . Sexual activity: Never  Other Topics Concern  . Not on file  Social History Narrative   Tobacco use, amount per day now: NONE   Past tobacco use, amount per day: NONE   How many years did you use tobacco: NONE   Alcohol use (drinks per week): 1   Diet: ONGOING   Do you drink/eat things with caffeine: YES   Marital status: WIDOWED                       What year were you married? 1956   Do you live in a house, apartment, assisted living, condo, trailer,  etc.? INDEPENDENT LIVING ([facility])   Is it one or more stories? ONE   How many persons live in your home? ONE   Do you have pets in your home?( please list) 3 DOGS   Current or past profession: PUBLISHER   Do you exercise?   YES                               Type and how often? SWIM DAILY   Do you have a living will? YES   Do you have a DNR form?   YES                                If not, do you want to discuss one?   Do you have signed POA/HPOA forms?  YES                      If so, please bring to you appointment   Has 2 adopted children   Social Determinants of Health   Financial Resource Strain: Not on file  Food Insecurity: No Food Insecurity (01/10/2023)   Hunger Vital Sign   . Worried About Programme researcher, broadcasting/film/video in the Last Year: Never true   . Ran Out of Food in the Last Year: Never true  Transportation Needs: No Transportation Needs (01/10/2023)   PRAPARE - Transportation   . Lack of Transportation (Medical): No   . Lack of Transportation (Non-Medical): No  Physical Activity: Not on file  Stress: Not on file  Social Connections: Not on file  Intimate Partner Violence: Not At Risk (01/10/2023)   Humiliation, Afraid, Rape, and Kick questionnaire   . Fear of Current or Ex-Partner: No   . Emotionally Abused: No   . Physically Abused: No   . Sexually Abused: No    Functional Status Survey:    Family History  Problem Relation Age of Onset  . Colon cancer Mother   . Heart disease Father   . Esophageal cancer Neg Hx   . Stomach cancer Neg Hx   . Pancreatic cancer Neg Hx     Health Maintenance  Topic Date Due  . DTaP/Tdap/Td (1 - Tdap) Never done  . Pneumonia Vaccine 28+ Years old (1 of 1 - PCV) 06/23/1995  . INFLUENZA VACCINE  01/17/2023  . COVID-19 Vaccine (8 - 2023-24 season) 02/10/2023  . DEXA SCAN  Completed  . Zoster Vaccines- Shingrix  Completed  . HPV VACCINES  Aged Out    Allergies  Allergen Reactions  . Onion Other (See Comments)    Excessive  sneezing  . Shellfish Allergy Shortness Of Breath  . Ambien [Zolpidem] Other (  See Comments)    "Allergic," per facility  . Amoxicillin Other (See Comments)    "Allergic," per facility    Outpatient Encounter Medications as of 01/15/2023  Medication Sig  . acetaminophen (TYLENOL) 325 MG tablet Take 650 mg by mouth 3 (three) times daily as needed (for pain).  Marland Kitchen levothyroxine (SYNTHROID) 25 MCG tablet Take 1 tablet (25 mcg total) by mouth every morning. (Patient taking differently: Take 25 mcg by mouth daily before breakfast.)  . ondansetron (ZOFRAN-ODT) 4 MG disintegrating tablet Take 4 mg by mouth every 6 (six) hours as needed for nausea or vomiting.  Marland Kitchen oxyCODONE (OXY IR/ROXICODONE) 5 MG immediate release tablet Take 1 tablet (5 mg total) by mouth every 4 (four) hours as needed for up to 3 days for moderate pain.  . polyethylene glycol (MIRALAX / GLYCOLAX) 17 g packet Take 17 g by mouth daily as needed for mild constipation (to be mixed into 6-8 ounces of fluid).  . polyvinyl alcohol (LIQUIFILM TEARS) 1.4 % ophthalmic solution Place 1 drop into both eyes 4 (four) times daily as needed for dry eyes.   No facility-administered encounter medications on file as of 01/15/2023.    Review of Systems  Vitals:   01/15/23 1413  BP: 90/62  Pulse: 94  Resp: 18  Temp: (!) 97.3 F (36.3 C)  TempSrc: Temporal  SpO2: 93%  Weight: 158 lb (71.7 kg)  Height: 5\' 7"  (1.702 m)   Body mass index is 24.75 kg/m. Physical Exam  Labs reviewed: Basic Metabolic Panel: Recent Labs    01/09/23 1955 01/10/23 0329 01/11/23 1039 01/11/23 1824 01/12/23 1237  NA  --  134* 134*  --  133*  K  --  3.1* 2.9*  --  3.4*  CL  --  98 100  --  100  CO2  --  25 26  --  25  GLUCOSE  --  135* 116*  --  116*  BUN  --  10 11  --  10  CREATININE  --  0.75 0.58  --  0.73  CALCIUM  --  8.2* 7.9*  --  7.7*  MG 1.9  --   --  1.7 1.6*  PHOS  --   --   --  2.5  --    Liver Function Tests: Recent Labs     06/21/22 0000 12/25/22 1828 01/07/23 0000 01/09/23 1900  AST 22 15  --  20  ALT 9 9  --  12  ALKPHOS 104 105 82 104  BILITOT  --  0.6  --  0.8  PROT  --  7.1  --  6.8  ALBUMIN  --  4.1 3.2* 2.8*   Recent Labs    01/09/23 1900  LIPASE 22   No results for input(s): "AMMONIA" in the last 8760 hours. CBC: Recent Labs    01/07/23 0000 01/09/23 1900 01/10/23 0329 01/10/23 1703 01/11/23 0138 01/11/23 1039 01/11/23 1824 01/12/23 0023 01/12/23 0359  WBC 9.2 11.9* 12.8*  --  12.6*  --   --   --  10.5  NEUTROABS 6.60 9.6*  --   --   --   --   --   --   --   HGB 12.2 12.9 11.6*   < > 11.4*   < > 10.6* 10.0* 9.9*  HCT 36 40.3 36.4   < > 35.9*   < > 33.6* 31.4* 30.5*  MCV  --  94.8 94.3  --  95.7  --   --   --  93.6  PLT 406* 196 142*  --  128*  --   --   --  101*   < > = values in this interval not displayed.   Cardiac Enzymes: No results for input(s): "CKTOTAL", "CKMB", "CKMBINDEX", "TROPONINI" in the last 8760 hours. BNP: Invalid input(s): "POCBNP" Lab Results  Component Value Date   HGBA1C 5.7 (H) 04/10/2011   Lab Results  Component Value Date   TSH 3.76 06/21/2022   No results found for: "VITAMINB12" No results found for: "FOLATE" No results found for: "IRON", "TIBC", "FERRITIN"  Imaging and Procedures obtained prior to SNF admission: CT ABDOMEN PELVIS W CONTRAST  Result Date: 01/09/2023 CLINICAL DATA:  Weakness, abdominal pain, loose/bloody stools EXAM: CT ABDOMEN AND PELVIS WITH CONTRAST TECHNIQUE: Multidetector CT imaging of the abdomen and pelvis was performed using the standard protocol following bolus administration of intravenous contrast. RADIATION DOSE REDUCTION: This exam was performed according to the departmental dose-optimization program which includes automated exposure control, adjustment of the mA and/or kV according to patient size and/or use of iterative reconstruction technique. CONTRAST:  OMNIPAQUE IOHEXOL 300 MG/ML  SOLN COMPARISON:  CT  12/25/2022 FINDINGS: Lower chest: No acute abnormality. Hepatobiliary: Cholelithiasis. No focal hepatic lesion. No evidence of cholecystitis. No biliary dilation. Pancreas: Unremarkable. Spleen: Unremarkable. Adrenals/Urinary Tract: Stable adrenal glands. Low-attenuation lesions in the kidneys are statistically likely to represent cysts. No follow-up is required. No urinary calculi or hydronephrosis. Unremarkable bladder. Stomach/Bowel: Diffuse colonic wall thickening and mucosal hyperenhancement with adjacent pericolonic inflammatory stranding. This has progressed from 12/25/2022. No evidence of obstruction. Stomach is within normal limits. Vascular/Lymphatic: New thrombus within the superior mesenteric vein causes severe narrowing and near occlusion (series 2/image 29). There is additional thrombus within the inferior mesenteric vein causing severe narrowing (series 2/image 26. Splenic and portal veins are patent. No lymphadenopathy by size. Reproductive: No acute abnormality. Other: Trace free fluid in the pelvis and right paracolic gutter. No free intraperitoneal air. Musculoskeletal: No acute fracture.  Left THA. IMPRESSION: 1. 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 2. Pancolitis, presumably infectious/inflammatory and progressed from 12/25/2022. These results were called by telephone at the time of interpretation on 01/09/2023 at 8:47 pm to provider [facility] , who verbally acknowledged these results. Electronically Signed   By: Minerva Fester M.D.   On: 01/09/2023 20:48    Assessment/Plan There are no diagnoses linked to this encounter.   Family/ staff Communication:   Labs/tests ordered:

## 2023-01-16 DIAGNOSIS — F0280 Dementia in other diseases classified elsewhere without behavioral disturbance: Secondary | ICD-10-CM | POA: Diagnosis not present

## 2023-01-16 DIAGNOSIS — K55069 Acute infarction of intestine, part and extent unspecified: Secondary | ICD-10-CM | POA: Diagnosis not present

## 2023-01-16 DIAGNOSIS — G311 Senile degeneration of brain, not elsewhere classified: Secondary | ICD-10-CM | POA: Diagnosis not present

## 2023-01-16 DIAGNOSIS — R630 Anorexia: Secondary | ICD-10-CM | POA: Diagnosis not present

## 2023-01-16 DIAGNOSIS — I10 Essential (primary) hypertension: Secondary | ICD-10-CM | POA: Diagnosis not present

## 2023-01-16 DIAGNOSIS — E785 Hyperlipidemia, unspecified: Secondary | ICD-10-CM | POA: Diagnosis not present

## 2023-01-16 LAB — PROTEIN S ACTIVITY: Protein S Activity: 43 % — ABNORMAL LOW (ref 63–140)

## 2023-01-16 LAB — PROTEIN C ACTIVITY: Protein C Activity: 100 % (ref 73–180)

## 2023-01-16 LAB — PROTEIN S, TOTAL: Protein S Ag, Total: 95 % (ref 60–150)

## 2023-01-17 DIAGNOSIS — E785 Hyperlipidemia, unspecified: Secondary | ICD-10-CM | POA: Diagnosis not present

## 2023-01-17 DIAGNOSIS — D649 Anemia, unspecified: Secondary | ICD-10-CM | POA: Diagnosis not present

## 2023-01-17 DIAGNOSIS — R1310 Dysphagia, unspecified: Secondary | ICD-10-CM | POA: Diagnosis not present

## 2023-01-17 DIAGNOSIS — F0280 Dementia in other diseases classified elsewhere without behavioral disturbance: Secondary | ICD-10-CM | POA: Diagnosis not present

## 2023-01-17 DIAGNOSIS — M810 Age-related osteoporosis without current pathological fracture: Secondary | ICD-10-CM | POA: Diagnosis not present

## 2023-01-17 DIAGNOSIS — I10 Essential (primary) hypertension: Secondary | ICD-10-CM | POA: Diagnosis not present

## 2023-01-17 DIAGNOSIS — K55069 Acute infarction of intestine, part and extent unspecified: Secondary | ICD-10-CM | POA: Diagnosis not present

## 2023-01-17 DIAGNOSIS — G311 Senile degeneration of brain, not elsewhere classified: Secondary | ICD-10-CM | POA: Diagnosis not present

## 2023-01-17 DIAGNOSIS — R630 Anorexia: Secondary | ICD-10-CM | POA: Diagnosis not present

## 2023-01-17 DIAGNOSIS — E039 Hypothyroidism, unspecified: Secondary | ICD-10-CM | POA: Diagnosis not present

## 2023-01-21 DIAGNOSIS — K55069 Acute infarction of intestine, part and extent unspecified: Secondary | ICD-10-CM | POA: Diagnosis not present

## 2023-01-21 DIAGNOSIS — R630 Anorexia: Secondary | ICD-10-CM | POA: Diagnosis not present

## 2023-01-21 DIAGNOSIS — I10 Essential (primary) hypertension: Secondary | ICD-10-CM | POA: Diagnosis not present

## 2023-01-21 DIAGNOSIS — G311 Senile degeneration of brain, not elsewhere classified: Secondary | ICD-10-CM | POA: Diagnosis not present

## 2023-01-21 DIAGNOSIS — F0280 Dementia in other diseases classified elsewhere without behavioral disturbance: Secondary | ICD-10-CM | POA: Diagnosis not present

## 2023-01-21 DIAGNOSIS — E785 Hyperlipidemia, unspecified: Secondary | ICD-10-CM | POA: Diagnosis not present

## 2023-01-22 ENCOUNTER — Encounter: Payer: Medicare Other | Admitting: Internal Medicine

## 2023-01-22 DIAGNOSIS — E785 Hyperlipidemia, unspecified: Secondary | ICD-10-CM | POA: Diagnosis not present

## 2023-01-22 DIAGNOSIS — G311 Senile degeneration of brain, not elsewhere classified: Secondary | ICD-10-CM | POA: Diagnosis not present

## 2023-01-22 DIAGNOSIS — F0280 Dementia in other diseases classified elsewhere without behavioral disturbance: Secondary | ICD-10-CM | POA: Diagnosis not present

## 2023-01-22 DIAGNOSIS — R630 Anorexia: Secondary | ICD-10-CM | POA: Diagnosis not present

## 2023-01-22 DIAGNOSIS — I10 Essential (primary) hypertension: Secondary | ICD-10-CM | POA: Diagnosis not present

## 2023-01-22 DIAGNOSIS — K55069 Acute infarction of intestine, part and extent unspecified: Secondary | ICD-10-CM | POA: Diagnosis not present

## 2023-01-23 DIAGNOSIS — F0280 Dementia in other diseases classified elsewhere without behavioral disturbance: Secondary | ICD-10-CM | POA: Diagnosis not present

## 2023-01-23 DIAGNOSIS — K55069 Acute infarction of intestine, part and extent unspecified: Secondary | ICD-10-CM | POA: Diagnosis not present

## 2023-01-23 DIAGNOSIS — I10 Essential (primary) hypertension: Secondary | ICD-10-CM | POA: Diagnosis not present

## 2023-01-23 DIAGNOSIS — E785 Hyperlipidemia, unspecified: Secondary | ICD-10-CM | POA: Diagnosis not present

## 2023-01-23 DIAGNOSIS — G311 Senile degeneration of brain, not elsewhere classified: Secondary | ICD-10-CM | POA: Diagnosis not present

## 2023-01-23 DIAGNOSIS — R630 Anorexia: Secondary | ICD-10-CM | POA: Diagnosis not present

## 2023-01-24 DIAGNOSIS — F0280 Dementia in other diseases classified elsewhere without behavioral disturbance: Secondary | ICD-10-CM | POA: Diagnosis not present

## 2023-01-24 DIAGNOSIS — E785 Hyperlipidemia, unspecified: Secondary | ICD-10-CM | POA: Diagnosis not present

## 2023-01-24 DIAGNOSIS — R630 Anorexia: Secondary | ICD-10-CM | POA: Diagnosis not present

## 2023-01-24 DIAGNOSIS — I10 Essential (primary) hypertension: Secondary | ICD-10-CM | POA: Diagnosis not present

## 2023-01-24 DIAGNOSIS — G311 Senile degeneration of brain, not elsewhere classified: Secondary | ICD-10-CM | POA: Diagnosis not present

## 2023-01-24 DIAGNOSIS — K55069 Acute infarction of intestine, part and extent unspecified: Secondary | ICD-10-CM | POA: Diagnosis not present

## 2023-01-25 DIAGNOSIS — K55069 Acute infarction of intestine, part and extent unspecified: Secondary | ICD-10-CM | POA: Diagnosis not present

## 2023-01-25 DIAGNOSIS — I10 Essential (primary) hypertension: Secondary | ICD-10-CM | POA: Diagnosis not present

## 2023-01-25 DIAGNOSIS — G311 Senile degeneration of brain, not elsewhere classified: Secondary | ICD-10-CM | POA: Diagnosis not present

## 2023-01-25 DIAGNOSIS — R630 Anorexia: Secondary | ICD-10-CM | POA: Diagnosis not present

## 2023-01-25 DIAGNOSIS — F0280 Dementia in other diseases classified elsewhere without behavioral disturbance: Secondary | ICD-10-CM | POA: Diagnosis not present

## 2023-01-25 DIAGNOSIS — E785 Hyperlipidemia, unspecified: Secondary | ICD-10-CM | POA: Diagnosis not present

## 2023-01-26 DIAGNOSIS — R630 Anorexia: Secondary | ICD-10-CM | POA: Diagnosis not present

## 2023-01-26 DIAGNOSIS — E785 Hyperlipidemia, unspecified: Secondary | ICD-10-CM | POA: Diagnosis not present

## 2023-01-26 DIAGNOSIS — I10 Essential (primary) hypertension: Secondary | ICD-10-CM | POA: Diagnosis not present

## 2023-01-26 DIAGNOSIS — K55069 Acute infarction of intestine, part and extent unspecified: Secondary | ICD-10-CM | POA: Diagnosis not present

## 2023-01-26 DIAGNOSIS — G311 Senile degeneration of brain, not elsewhere classified: Secondary | ICD-10-CM | POA: Diagnosis not present

## 2023-01-26 DIAGNOSIS — F0280 Dementia in other diseases classified elsewhere without behavioral disturbance: Secondary | ICD-10-CM | POA: Diagnosis not present

## 2023-01-27 DIAGNOSIS — I10 Essential (primary) hypertension: Secondary | ICD-10-CM | POA: Diagnosis not present

## 2023-01-27 DIAGNOSIS — E785 Hyperlipidemia, unspecified: Secondary | ICD-10-CM | POA: Diagnosis not present

## 2023-01-27 DIAGNOSIS — G311 Senile degeneration of brain, not elsewhere classified: Secondary | ICD-10-CM | POA: Diagnosis not present

## 2023-01-27 DIAGNOSIS — F0280 Dementia in other diseases classified elsewhere without behavioral disturbance: Secondary | ICD-10-CM | POA: Diagnosis not present

## 2023-01-27 DIAGNOSIS — R630 Anorexia: Secondary | ICD-10-CM | POA: Diagnosis not present

## 2023-01-27 DIAGNOSIS — K55069 Acute infarction of intestine, part and extent unspecified: Secondary | ICD-10-CM | POA: Diagnosis not present

## 2023-01-29 ENCOUNTER — Encounter: Payer: Medicare Other | Admitting: Internal Medicine

## 2023-02-17 DEATH — deceased
